# Patient Record
Sex: Female | Born: 1940 | Race: Black or African American | Hispanic: No | Smoking: Former smoker
Health system: Southern US, Community
[De-identification: ages and names within clinical notes are randomized; demographics above are authoritative.]

## PROBLEM LIST (undated history)

## (undated) DIAGNOSIS — E785 Hyperlipidemia, unspecified: Secondary | ICD-10-CM

## (undated) DIAGNOSIS — M5136 Other intervertebral disc degeneration, lumbar region: Secondary | ICD-10-CM

## (undated) DIAGNOSIS — M545 Low back pain, unspecified: Secondary | ICD-10-CM

## (undated) DIAGNOSIS — I10 Essential (primary) hypertension: Secondary | ICD-10-CM

## (undated) DIAGNOSIS — K219 Gastro-esophageal reflux disease without esophagitis: Secondary | ICD-10-CM

## (undated) DIAGNOSIS — D126 Benign neoplasm of colon, unspecified: Secondary | ICD-10-CM

## (undated) DIAGNOSIS — N3281 Overactive bladder: Secondary | ICD-10-CM

## (undated) DIAGNOSIS — E119 Type 2 diabetes mellitus without complications: Secondary | ICD-10-CM

## (undated) DIAGNOSIS — R350 Frequency of micturition: Secondary | ICD-10-CM

## (undated) DIAGNOSIS — G43909 Migraine, unspecified, not intractable, without status migrainosus: Secondary | ICD-10-CM

## (undated) DIAGNOSIS — M1990 Unspecified osteoarthritis, unspecified site: Secondary | ICD-10-CM

## (undated) DIAGNOSIS — N183 Chronic kidney disease, stage 3 unspecified: Secondary | ICD-10-CM

## (undated) DIAGNOSIS — I700 Atherosclerosis of aorta: Secondary | ICD-10-CM

## (undated) DIAGNOSIS — R002 Palpitations: Secondary | ICD-10-CM

## (undated) DIAGNOSIS — M51369 Other intervertebral disc degeneration, lumbar region without mention of lumbar back pain or lower extremity pain: Secondary | ICD-10-CM

## (undated) DIAGNOSIS — R911 Solitary pulmonary nodule: Secondary | ICD-10-CM

## (undated) HISTORY — DX: Type 2 diabetes mellitus without complications: E11.9

## (undated) HISTORY — DX: Low back pain, unspecified: M54.50

## (undated) HISTORY — DX: Migraine, unspecified, not intractable, without status migrainosus: G43.909

## (undated) HISTORY — DX: Low back pain: M54.5

## (undated) HISTORY — DX: Palpitations: R00.2

## (undated) HISTORY — DX: Atherosclerosis of aorta: I70.0

## (undated) HISTORY — DX: Unspecified osteoarthritis, unspecified site: M19.90

## (undated) HISTORY — DX: Hyperlipidemia, unspecified: E78.5

## (undated) HISTORY — DX: Gastro-esophageal reflux disease without esophagitis: K21.9

## (undated) HISTORY — DX: Frequency of micturition: R35.0

## (undated) HISTORY — DX: Other intervertebral disc degeneration, lumbar region: M51.36

## (undated) HISTORY — PX: SHOULDER SURGERY: SHX246

## (undated) HISTORY — DX: Overactive bladder: N32.81

## (undated) HISTORY — DX: Chronic kidney disease, stage 3 unspecified: N18.30

## (undated) HISTORY — DX: Essential (primary) hypertension: I10

## (undated) HISTORY — PX: ABDOMINAL HYSTERECTOMY: SHX81

## (undated) HISTORY — DX: Other intervertebral disc degeneration, lumbar region without mention of lumbar back pain or lower extremity pain: M51.369

## (undated) HISTORY — DX: Benign neoplasm of colon, unspecified: D12.6

## (undated) HISTORY — DX: Solitary pulmonary nodule: R91.1

---

## 1977-01-10 HISTORY — PX: ABDOMINAL HYSTERECTOMY: SHX81

## 1987-11-12 HISTORY — PX: OTHER SURGICAL HISTORY: SHX169

## 1994-11-15 HISTORY — PX: CARDIAC CATHETERIZATION: SHX172

## 1997-06-24 ENCOUNTER — Other Ambulatory Visit: Admission: RE | Admit: 1997-06-24 | Discharge: 1997-06-24 | Payer: Self-pay | Admitting: Family Medicine

## 1997-09-22 ENCOUNTER — Ambulatory Visit (HOSPITAL_COMMUNITY): Admission: RE | Admit: 1997-09-22 | Discharge: 1997-09-22 | Payer: Self-pay | Admitting: Gastroenterology

## 1998-11-12 ENCOUNTER — Encounter: Admission: RE | Admit: 1998-11-12 | Discharge: 1998-11-23 | Payer: Self-pay | Admitting: Family Medicine

## 1999-01-11 HISTORY — PX: CATARACT EXTRACTION, BILATERAL: SHX1313

## 2002-01-10 HISTORY — PX: COLONOSCOPY: SHX174

## 2002-12-04 ENCOUNTER — Ambulatory Visit (HOSPITAL_COMMUNITY): Admission: RE | Admit: 2002-12-04 | Discharge: 2002-12-04 | Payer: Self-pay | Admitting: Gastroenterology

## 2003-11-03 HISTORY — PX: CARDIAC CATHETERIZATION: SHX172

## 2004-08-22 ENCOUNTER — Emergency Department (HOSPITAL_COMMUNITY): Admission: EM | Admit: 2004-08-22 | Discharge: 2004-08-22 | Payer: Self-pay | Admitting: Emergency Medicine

## 2007-01-11 HISTORY — PX: SHOULDER SURGERY: SHX246

## 2008-01-11 HISTORY — PX: COLONOSCOPY: SHX174

## 2009-02-10 LAB — HM DEXA SCAN

## 2009-02-24 ENCOUNTER — Encounter: Admission: RE | Admit: 2009-02-24 | Discharge: 2009-02-24 | Payer: Self-pay | Admitting: Internal Medicine

## 2009-06-20 ENCOUNTER — Ambulatory Visit: Payer: Self-pay | Admitting: Internal Medicine

## 2009-06-20 ENCOUNTER — Emergency Department (HOSPITAL_COMMUNITY): Admission: EM | Admit: 2009-06-20 | Discharge: 2009-06-20 | Payer: Self-pay | Admitting: Family Medicine

## 2009-06-20 ENCOUNTER — Observation Stay (HOSPITAL_COMMUNITY): Admission: EM | Admit: 2009-06-20 | Discharge: 2009-06-21 | Payer: Self-pay | Admitting: Emergency Medicine

## 2009-06-23 ENCOUNTER — Inpatient Hospital Stay (HOSPITAL_COMMUNITY)
Admission: EM | Admit: 2009-06-23 | Discharge: 2009-06-25 | Payer: Self-pay | Source: Home / Self Care | Admitting: Emergency Medicine

## 2010-01-10 DIAGNOSIS — R911 Solitary pulmonary nodule: Secondary | ICD-10-CM

## 2010-01-10 HISTORY — DX: Solitary pulmonary nodule: R91.1

## 2010-01-31 ENCOUNTER — Encounter: Payer: Self-pay | Admitting: Internal Medicine

## 2010-03-28 LAB — COMPREHENSIVE METABOLIC PANEL
ALT: 20 U/L (ref 0–35)
Alkaline Phosphatase: 69 U/L (ref 39–117)
BUN: 14 mg/dL (ref 6–23)
GFR calc Af Amer: >60 mL/min (ref >60)
GFR calc non Af Amer: >60 mL/min (ref >60)
Total Bilirubin: 0.5 mg/dL (ref 0.3–1.2)

## 2010-03-28 LAB — CBC
HCT: 39.5 % (ref 36.0–46.0)
Hemoglobin: 13.4 g/dL (ref 12.0–15.0)
MCHC: 33.8 g/dL (ref 30.0–36.0)
Platelets: 279 10*3/uL (ref 150–400)

## 2010-03-28 LAB — CARDIAC PANEL(CRET KIN+CKTOT+MB+TROPI)
Relative Index: INVALID (ref 0.0–2.5)
Troponin I: 0.02 ng/mL (ref 0.00–0.06)

## 2010-03-28 LAB — C-REACTIVE PROTEIN: CRP: 0.1 mg/dL — ABNORMAL LOW (ref <0.6)

## 2010-03-28 LAB — SEDIMENTATION RATE: Sed Rate: 2 mm/hr (ref 0–22)

## 2010-03-28 LAB — CK TOTAL AND CKMB (NOT AT ARMC): Relative Index: INVALID (ref 0.0–2.5)

## 2010-03-29 LAB — CBC
HCT: 39.5 % (ref 36.0–46.0)
HCT: 43.2 % (ref 36.0–46.0)
Hemoglobin: 13.4 g/dL (ref 12.0–15.0)
Hemoglobin: 14.7 g/dL (ref 12.0–15.0)
MCHC: 33.9 g/dL (ref 30.0–36.0)
Platelets: 278 10*3/uL (ref 150–400)
RBC: 4.74 MIL/uL (ref 3.87–5.11)
RDW: 14.6 % (ref 11.5–15.5)

## 2010-03-29 LAB — BASIC METABOLIC PANEL
CO2: 25 mEq/L (ref 19–32)
Calcium: 9.1 mg/dL (ref 8.4–10.5)
Chloride: 105 mEq/L (ref 96–112)
Creatinine, Ser: 0.73 mg/dL (ref 0.4–1.2)
GFR calc non Af Amer: >60 mL/min (ref >60)
Potassium: 3.4 mEq/L — ABNORMAL LOW (ref 3.5–5.1)

## 2010-03-29 LAB — URINALYSIS, ROUTINE W REFLEX MICROSCOPIC
Glucose, UA: NEGATIVE mg/dL
Glucose, UA: NEGATIVE mg/dL
Hgb urine dipstick: NEGATIVE
Hgb urine dipstick: NEGATIVE
Ketones, ur: NEGATIVE mg/dL
Leukocytes, UA: NEGATIVE
Urobilinogen, UA: 0.2 mg/dL (ref 0.0–1.0)
pH: 8 (ref 5.0–8.0)

## 2010-03-29 LAB — URINE MICROSCOPIC-ADD ON

## 2010-03-29 LAB — POCT I-STAT, CHEM 8
BUN: 13 mg/dL (ref 6–23)
Calcium, Ion: 1.18 mmol/L (ref 1.12–1.32)
Calcium, Ion: 1.18 mmol/L (ref 1.12–1.32)
Chloride: 101 mEq/L (ref 96–112)
Chloride: 106 mEq/L (ref 96–112)
Creatinine, Ser: 0.7 mg/dL (ref 0.4–1.2)
Creatinine, Ser: 0.8 mg/dL (ref 0.4–1.2)
Glucose, Bld: 120 mg/dL — ABNORMAL HIGH (ref 70–99)
Potassium: 3.7 mEq/L (ref 3.5–5.1)
Sodium: 139 mEq/L (ref 135–145)

## 2010-03-29 LAB — DIFFERENTIAL
Basophils Absolute: 0 10*3/uL (ref 0.0–0.1)
Basophils Absolute: 0 10*3/uL (ref 0.0–0.1)
Basophils Relative: 0 % (ref 0–1)
Eosinophils Relative: 0 % (ref 0–5)
Lymphocytes Relative: 41 % (ref 12–46)
Lymphs Abs: 1.9 10*3/uL (ref 0.7–4.0)
Monocytes Absolute: 0.2 10*3/uL (ref 0.1–1.0)
Monocytes Absolute: 0.3 10*3/uL (ref 0.1–1.0)
Monocytes Relative: 4 % (ref 3–12)
Monocytes Relative: 7 % (ref 3–12)
Neutro Abs: 4.5 10*3/uL (ref 1.7–7.7)
Neutrophils Relative %: 51 % (ref 43–77)

## 2010-03-29 LAB — COMPREHENSIVE METABOLIC PANEL
BUN: 10 mg/dL (ref 6–23)
CO2: 26 mEq/L (ref 19–32)
Calcium: 9 mg/dL (ref 8.4–10.5)
Chloride: 103 mEq/L (ref 96–112)
Creatinine, Ser: 0.87 mg/dL (ref 0.4–1.2)
Glucose, Bld: 86 mg/dL (ref 70–99)

## 2010-03-29 LAB — APTT: aPTT: 27 seconds (ref 24–37)

## 2010-03-29 LAB — LIPID PANEL
Cholesterol: 137 mg/dL (ref 0–200)
LDL Cholesterol: 63 mg/dL (ref 0–99)
Triglycerides: 133 mg/dL (ref <150)
VLDL: 27 mg/dL (ref 0–40)

## 2010-03-29 LAB — URINE CULTURE

## 2010-03-29 LAB — HEMOGLOBIN A1C
Hgb A1c MFr Bld: 6.1 % — ABNORMAL HIGH (ref <5.7)
Mean Plasma Glucose: 128 mg/dL — ABNORMAL HIGH (ref <117)

## 2010-03-29 LAB — POCT CARDIAC MARKERS
CKMB, poc: 1.7 ng/mL (ref 1.0–8.0)
CKMB, poc: 2.2 ng/mL (ref 1.0–8.0)
Myoglobin, poc: 63.4 ng/mL (ref 12–200)
Myoglobin, poc: 96.1 ng/mL (ref 12–200)
Troponin i, poc: <0.05 ng/mL (ref 0.00–0.09)

## 2010-05-19 HISTORY — PX: TRANSTHORACIC ECHOCARDIOGRAM: SHX275

## 2010-05-28 NOTE — Op Note (Signed)
NAME:  Mandy Perry, SERGENT                          ACCOUNT NO.:  [id number]   MEDICAL RECORD NO.:  [id number]                   PATIENT TYPE:  AMB   LOCATION:  ENDO                                 FACILITY:  [facility]   PHYSICIAN:  Bernette Redbird, M.D.                DATE OF BIRTH:  December 10, 1940   DATE OF PROCEDURE:  12/04/2002  DATE OF DISCHARGE:                                 OPERATIVE REPORT   PROCEDURE:  Colonoscopy.   INDICATIONS:  Followup of solitary adenomatous polyp removed  colonoscopically five years ago.   FINDINGS:  Normal exam except perhaps minimal diverticulosis.   DESCRIPTION OF PROCEDURE:  The nature, purpose and risks of this procedure  were familiar to the patient from prior examination and she provided written  consent.  Sedation was fentanyl 75 mcg and Versed 6 mg IV without  arrhythmias or desaturation.  The Olympus adjustable tension pediatric video  coloscope was easily advanced to the terminal ileum which had a normal  appearance and pull-back was then performed, having entered the terminal  ileum for a short distance.  The quality of the prep was very good and it  was felt that all areas were well seen.   This was a normal examination.  No polyps, cancer, colitis, vascular  malformations or significant diverticulosis were noted.  There may have been  a few scattered left-sided diverticula.  Retroflexion of the rectum and  reinspection of the rectosigmoid was unremarkable.  No biopsies were  obtained. The patient tolerated the procedure well and there were no  apparent complications.   IMPRESSION:  Normal surveillance colonoscopy in a patient with a prior  history of colonic adenoma having been removed.   PLAN:  Followup colonoscopy in five years.                                               Bernette Redbird, M.D.    RB/MEDQ  D:  12/04/2002  T:  12/04/2002  Job:  [id number]   cc:   Talmadge Coventry, M.D.  [address]  [place]  [place]  [address]  Fax: [phone]

## 2011-04-14 ENCOUNTER — Other Ambulatory Visit: Payer: Self-pay | Admitting: Internal Medicine

## 2011-04-14 DIAGNOSIS — R1011 Right upper quadrant pain: Secondary | ICD-10-CM

## 2011-04-15 ENCOUNTER — Ambulatory Visit
Admission: RE | Admit: 2011-04-15 | Discharge: 2011-04-15 | Disposition: A | Discharge status: Home / Self Care | Payer: Medicare Other | Source: Ambulatory Visit | Attending: Internal Medicine | Admitting: Internal Medicine

## 2011-04-15 DIAGNOSIS — R1011 Right upper quadrant pain: Secondary | ICD-10-CM

## 2012-04-03 ENCOUNTER — Other Ambulatory Visit: Payer: 59

## 2012-04-03 ENCOUNTER — Other Ambulatory Visit: Payer: Self-pay | Admitting: *Deleted

## 2012-04-03 DIAGNOSIS — E781 Pure hyperglyceridemia: Secondary | ICD-10-CM

## 2012-04-03 DIAGNOSIS — E785 Hyperlipidemia, unspecified: Secondary | ICD-10-CM

## 2012-04-03 DIAGNOSIS — E119 Type 2 diabetes mellitus without complications: Secondary | ICD-10-CM

## 2012-04-04 ENCOUNTER — Other Ambulatory Visit: Payer: Self-pay | Admitting: Geriatric Medicine

## 2012-04-04 LAB — HEPATIC FUNCTION PANEL
ALT: 12 IU/L (ref 0–32)
AST: 14 IU/L (ref 0–40)
Albumin: 4.7 g/dL (ref 3.5–4.8)
Alkaline Phosphatase: 53 IU/L (ref 39–117)
Bilirubin, Direct: 0.09 mg/dL (ref 0.00–0.40)
Total Bilirubin: 0.3 mg/dL (ref 0.0–1.2)
Total Protein: 7.5 g/dL (ref 6.0–8.5)

## 2012-04-04 LAB — LIPID PANEL
Chol/HDL Ratio: 3.9 ratio units (ref 0.0–4.4)
Cholesterol, Total: 156 mg/dL (ref 100–199)
HDL: 40 mg/dL (ref >39)
LDL Calculated: 81 mg/dL (ref 0–99)
Triglycerides: 174 mg/dL — ABNORMAL HIGH (ref 0–149)
VLDL Cholesterol Cal: 35 mg/dL (ref 5–40)

## 2012-04-04 MED ORDER — LOSARTAN POTASSIUM 50 MG PO TABS: 50.0000 mg | ORAL_TABLET | Freq: Every day | ORAL | Status: DC

## 2012-05-14 ENCOUNTER — Ambulatory Visit: Payer: Self-pay | Admitting: Internal Medicine

## 2012-05-15 ENCOUNTER — Other Ambulatory Visit: Payer: Self-pay | Admitting: Nurse Practitioner

## 2012-05-18 ENCOUNTER — Encounter: Payer: Self-pay | Admitting: *Deleted

## 2012-05-21 ENCOUNTER — Ambulatory Visit (INDEPENDENT_AMBULATORY_CARE_PROVIDER_SITE_OTHER): Payer: 59 | Admitting: Internal Medicine

## 2012-05-21 ENCOUNTER — Encounter: Payer: Self-pay | Admitting: Internal Medicine

## 2012-05-21 VITALS — BP 142/78 | HR 58 | Temp 98.4°F | Resp 16 | Ht 65.0 in | Wt 141.0 lb

## 2012-05-21 DIAGNOSIS — I10 Essential (primary) hypertension: Secondary | ICD-10-CM

## 2012-05-21 DIAGNOSIS — E782 Mixed hyperlipidemia: Secondary | ICD-10-CM | POA: Insufficient documentation

## 2012-05-21 DIAGNOSIS — K219 Gastro-esophageal reflux disease without esophagitis: Secondary | ICD-10-CM

## 2012-05-21 DIAGNOSIS — R350 Frequency of micturition: Secondary | ICD-10-CM

## 2012-05-21 DIAGNOSIS — E785 Hyperlipidemia, unspecified: Secondary | ICD-10-CM

## 2012-05-21 DIAGNOSIS — E119 Type 2 diabetes mellitus without complications: Secondary | ICD-10-CM | POA: Insufficient documentation

## 2012-05-21 MED ORDER — METOPROLOL TARTRATE 100 MG PO TABS: 100.0000 mg | ORAL_TABLET | Freq: Every day | ORAL | Status: DC

## 2012-05-21 MED ORDER — HYDROCHLOROTHIAZIDE 25 MG PO TABS: 25.0000 mg | ORAL_TABLET | Freq: Every day | ORAL | Status: DC

## 2012-05-21 MED ORDER — METFORMIN HCL 1000 MG PO TABS: 1000.0000 mg | ORAL_TABLET | Freq: Two times a day (BID) | ORAL | Status: DC

## 2012-05-21 NOTE — Progress Notes (Signed)
Patient ID: Mandy Perry, female   DOB: 1940-07-21, 72 y.o.   MRN: [id number] Code Status: Does not have living will--discussed today.    Allergies  Allergen Reactions  . Azithromycin   . Tramadol     Chief Complaint  Patient presents with  . Medical Managment of Chronic Issues    urinary urgency    HPI: Patient is a 72 y.o. AA female seen in the office today for medical mgt of chronic diseases.  She has had increased urinary urgency recently.  Comes time to time.  Cannot always go when she tries.  Can go later.  Feels sensation, but nothing there at first.  Sometimes does go frequently.  Also goes twice at night, but this is routine.    Says she has difficulty thinking of what she is going to say--sometimes has to be a minute before it comes to her.  Keeps calendar to remember appts.    Thinks she has some headaches from stress and allergies.  Allegra has helped her.  Took 4 days to kick in.    Review of Systems:  Review of Systems  Constitutional: Negative for fever and chills.  HENT: Positive for congestion and sore throat.   Eyes: Negative for blurred vision.  Respiratory: Positive for cough. Negative for shortness of breath.   Cardiovascular: Negative for chest pain.  Gastrointestinal: Positive for abdominal pain. Negative for blood in stool and melena.  Genitourinary: Positive for dysuria, urgency and frequency.  Musculoskeletal: Negative for falls.  Skin: Negative for rash.  Neurological: Negative for dizziness.  Psychiatric/Behavioral: Negative for depression and memory loss.     Past Medical History  Diagnosis Date  . Diabetes mellitus without complication   . Hyperlipidemia   . Hypertension   . Solitary pulmonary nodule 2012  . Migraine, unspecified, without mention of intractable migraine without mention of status migrainosus   . Lumbago   . Benign neoplasm of colon   . GERD (gastroesophageal reflux disease)   . Other premature beats   . Osteoarthrosis,  unspecified whether generalized or localized, unspecified site   . Palpitations   . Urinary frequency    Past Surgical History  Procedure Laterality Date  . Abdominal hysterectomy     Social History:   reports that she has quit smoking. Her smoking use included Cigarettes. She smoked 0.00 packs per day. She does not have any smokeless tobacco history on file. She reports that she does not drink alcohol or use illicit drugs.  Family History  Problem Relation Age of Onset  . CVA Mother   . Cancer Father     prostate  . Cancer Brother     Medications: Patient's Medications  New Prescriptions   No medications on file  Previous Medications   AMLODIPINE (NORVASC) 10 MG TABLET    TAKE ONE-HALF TABLET BY MOUTH EVERY DAY FOR BLOOD PRESSURE   ASPIRIN 81 MG TABLET    Take 81 mg by mouth daily.   GEMFIBROZIL (LOPID) 600 MG TABLET    Take one tablet by mouth at bedtime for triglycerides   HYDROCHLOROTHIAZIDE (HYDRODIURIL) 25 MG TABLET    Take one tablet by mouth once daily to control blood pressure   LOSARTAN (COZAAR) 50 MG TABLET    Take 1 tablet (50 mg total) by mouth daily.   METFORMIN (GLUCOPHAGE) 1000 MG TABLET    Take one tablet once twice daily for diabetes   METOPROLOL (LOPRESSOR) 100 MG TABLET    Take one tablet by  mouth once daily for blood pressure   MULTIPLE VITAMINS-MINERALS (CENTRUM SILVER PO)    Take one tablet once daily   OMEGA-3 FATTY ACIDS (OMEGA 3 PO)    Take one tablet by mouth twice daily for cholesterol   PRAVASTATIN (PRAVACHOL) 40 MG TABLET    Take 1/2 tablet by mouth once daily to control cholesterol  Modified Medications   No medications on file  Discontinued Medications   No medications on file   Physical Exam: Filed Vitals:   05/21/12 0832  BP: 142/78  Pulse: 58  Temp: 98.4 F (36.9 C)  TempSrc: Oral  Resp: 16  Height: 5\' 5"  (1.651 m)  Weight: 141 lb (63.957 kg)  SpO2: 95%   Physical Exam  Constitutional: She is oriented to person, place, and time.  She appears well-developed and well-nourished. No distress.  HENT:  Mouth/Throat: Oropharyngeal exudate present.  Cardiovascular: Normal rate, regular rhythm, normal heart sounds and intact distal pulses.   Pulmonary/Chest: Breath sounds normal. No respiratory distress.  Abdominal: Soft. Bowel sounds are normal. She exhibits no distension. There is no tenderness.  Musculoskeletal: Normal range of motion. She exhibits no edema and no tenderness.  Neurological: She is alert and oriented to person, place, and time.  Skin: Skin is warm and dry.    Labs reviewed: Liver Function Tests:  Recent Labs  04/03/12 0922  AST 14  ALT 12  ALKPHOS 53  BILITOT 0.3  PROT 7.5  Lipid Panel:  Recent Labs  04/03/12 0922  HDL 40  LDLCALC 81  TRIG 174*  CHOLHDL 3.9   Assessment/Plan 1. Diabetes mellitus without complication--may be causing polyuria - CMP; Future - Hemoglobin A1c; Future - metFORMIN (GLUCOPHAGE) 1000 MG tablet; Take 1 tablet (1,000 mg total) by mouth 2 (two) times daily with a meal. Take one tablet once twice daily for diabetes  Dispense: 60 tablet; Refill: 5  2. Hyperlipidemia Lipids above goal with 20mg  pravachol, increase to 40mg   3. GERD (gastroesophageal reflux disease) -stable, no changes needed  4. Urinary frequency -may be due to uncontrolled DM--f/u hba1c - CBC with Differential; Future - if persists and dm reasonably controlled, will need ua c+s  5. Essential hypertension, benign Refilled meds: - metoprolol (LOPRESSOR) 100 MG tablet; Take 1 tablet (100 mg total) by mouth daily. Take one tablet by mouth once daily for blood pressure  Dispense: 30 tablet; Refill: 5 - hydrochlorothiazide (HYDRODIURIL) 25 MG tablet; Take 1 tablet (25 mg total) by mouth daily. Take one tablet by mouth once daily to control blood pressure  Dispense: 30 tablet; Refill: 5  Labs/tests ordered  Cbc, hba1c, cmp

## 2012-07-17 ENCOUNTER — Other Ambulatory Visit: Payer: Self-pay | Admitting: Nurse Practitioner

## 2012-07-17 ENCOUNTER — Other Ambulatory Visit: Payer: Self-pay | Admitting: Internal Medicine

## 2012-07-20 LAB — HM MAMMOGRAPHY: HM Mammogram: NORMAL

## 2012-07-25 ENCOUNTER — Other Ambulatory Visit: Payer: Self-pay | Admitting: Internal Medicine

## 2012-08-08 ENCOUNTER — Encounter: Payer: Self-pay | Admitting: Internal Medicine

## 2012-08-29 ENCOUNTER — Other Ambulatory Visit: Payer: Self-pay | Admitting: Internal Medicine

## 2012-09-12 ENCOUNTER — Other Ambulatory Visit: Payer: Self-pay | Admitting: Nurse Practitioner

## 2012-10-01 ENCOUNTER — Other Ambulatory Visit: Payer: Self-pay | Admitting: Internal Medicine

## 2012-10-11 ENCOUNTER — Ambulatory Visit (INDEPENDENT_AMBULATORY_CARE_PROVIDER_SITE_OTHER): Payer: Medicare PPO | Admitting: Cardiology

## 2012-10-11 ENCOUNTER — Encounter: Payer: Self-pay | Admitting: Cardiology

## 2012-10-11 ENCOUNTER — Telehealth: Payer: Self-pay | Admitting: Cardiology

## 2012-10-11 VITALS — BP 140/80 | HR 48 | Ht 65.0 in | Wt 139.9 lb

## 2012-10-11 DIAGNOSIS — E785 Hyperlipidemia, unspecified: Secondary | ICD-10-CM

## 2012-10-11 DIAGNOSIS — Z8679 Personal history of other diseases of the circulatory system: Secondary | ICD-10-CM

## 2012-10-11 DIAGNOSIS — I10 Essential (primary) hypertension: Secondary | ICD-10-CM

## 2012-10-11 NOTE — Assessment & Plan Note (Signed)
Relatively well-controlled.  Stable.

## 2012-10-11 NOTE — Assessment & Plan Note (Signed)
Well-controlled PVCs and PSVT.  Lesion to clarify whether she is taking tartrate or succinate version of metoprolol.  Next the dosing is correct.  She taken 100 mg tartrate once daily, that is incorrect dosing, it should be 50 mg twice a day of metoprolol tartrate.  100 mg daily of metoprolol succinate would be the correct dose but not tartrate.  We will make sure prescriptions none currently.

## 2012-10-11 NOTE — Patient Instructions (Signed)
You seem to be doing well.   Glad to hear the the irregular heartbeats are under control.  I want you to check on your medication bottle for Metoprolol. Look to see if it is Metoprolol Tartrate or Succinate.   If the bottle says Succinate, and then the dosing as directed 100 mg daily;  If the bottle says Tartrate, then I would like for you to cut the pill in half and take it is one half tablet twice a day (that is the equivalent of 50 mg twice a day)  Otherwise things seem to be doing very well, glad to see that you are doing well, and continuing to go for walks.  We will see you back in a year.  Marykay Lex, MD  Your physician wants you to follow-up in: 1 year. You will receive a reminder letter in the mail two months in advance. If you don't receive a letter, please call our office to schedule the follow-up appointment.

## 2012-10-11 NOTE — Telephone Encounter (Signed)
Pt was in the office today and was instructed by Burt Knack to call the office in regards to meds.

## 2012-10-11 NOTE — Telephone Encounter (Signed)
Patient states that her bottle at home is Metoprolol Tartrate  . PER  Instruction from this morning visit. She will take 1/2 tablet twice a day for now on.

## 2012-10-11 NOTE — Progress Notes (Signed)
PATIENT: Mandy Perry MRN: [id number]  DOB: April 19, 1940   DOV:10/12/2012 PCP: Bufford Spikes, DO  Clinic Note: Chief Complaint  Patient presents with  . 18 month visit    chestpain when Mandy Perry walks and get tired, sob with walking,edema  labs in 03/2012    HPI: Mandy Perry is a 72 y.o. female with a PMH below who presents today for what amounts to be a 1 year half follow for her history of SVT.  Mandy Perry's been evaluated in the past for chest discomfort is an echocardiogram done on the sclerotic aortic valve without stenosis.  Mandy Perry had treadmill stress test in November of 89 negative.  On heart catheterization 2005 that was essentially normal.  Interval History: Mandy Perry presented today, has not been seen since April 2013.  It seems like her prescription was refilled for metoprolol via telephone, but I'm not sure if Mandy Perry got it refilled correctly.  It is currently listed as metoprolol tartrate 100 mg daily as opposed to  metoprololsuccinate 100 mg daily. Her major concern today is that Mandy Perry has is in her chest when Mandy Perry walks early walks a mile a day.  Is really described as chest pain or any per say just a tiredness.  He denies any dyspnea associated with it.  On that side Mandy Perry denies any frequent palpitations.  Mandy Perry says he feels every now and then a flip-flop but nothing that last one a couple seconds.  Nothing that makes her concerned.  The remainder of Cardiovascular ROS: positive for - Decreased exercise with fatigue negative for - edema, loss of consciousness, murmur, orthopnea, paroxysmal nocturnal dyspnea, rapid heart rate or shortness of breath: Additional cardiac review of systems: Lightheadedness - no, dizziness - no, syncope/near-syncope - no; TIA/amaurosis fugax - no Melena - no, hematochezia no; hematuria - no; nosebleeds - no; claudication - no  Past Medical History  Diagnosis Date  . Diabetes mellitus without complication     A1c currently a goal  . Hyperlipidemia     Followed by PCP  .  Essential hypertension, benign   . Solitary pulmonary nodule 2012  . Migraine, unspecified, without mention of intractable migraine without mention of status migrainosus   . Lumbago   . Benign neoplasm of colon   . GERD (gastroesophageal reflux disease)   . Osteoarthrosis, unspecified whether generalized or localized, unspecified site   . Palpitations     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  . Urinary frequency   . Hypertension     Prior Cardiac Evaluation and Past Surgical History: Past Surgical History  Procedure Laterality Date  . Abdominal hysterectomy    . Cardiac catheterization  11/15/1994    Patent coronary arteries and normal LV function  . Cardiac catheterization  11/03/2003    Normal coronary arteries  . Exercise stress test  11/12/1987    Positive  . Transthoracic echocardiogram  05/19/2010    EF >55%, normal-mild    Allergies  Allergen Reactions  . Azithromycin Hives  . Statins     Myalgias-high doses  . Tramadol Nausea Only    Current Outpatient Prescriptions  Medication Sig Dispense Refill  . acetaminophen (TYLENOL) 500 MG tablet Take 500 mg by mouth as needed for pain.      Marland Kitchen amLODipine (NORVASC) 10 MG tablet TAKE ONE-HALF TABLET BY MOUTH ONCE DAILY FOR BLOOD PRESSURE  30 tablet  0  . aspirin 325 MG tablet Take 1/2 tablet by mouth daily      . hydrochlorothiazide (HYDRODIURIL) 25 MG  tablet Take 1 tablet (25 mg total) by mouth daily. Take one tablet by mouth once daily to control blood pressure  30 tablet  5  . Lansoprazole (PREVACID PO) Take by mouth as needed.      Marland Kitchen losartan (COZAAR) 50 MG tablet TAKE ONE TABLET BY MOUTH ONCE DAILY  30 tablet  2  . metFORMIN (GLUCOPHAGE) 1000 MG tablet Take 1 tablet (1,000 mg total) by mouth 2 (two) times daily with a meal. Take one tablet once twice daily for diabetes  60 tablet  5  . metoprolol (LOPRESSOR) 100 MG tablet Take 1/2 tablet of 100 mg by mouth twice a day      . Multiple Vitamins-Minerals (CENTRUM SILVER PO) Take one  tablet once daily      . Omega-3 Fatty Acids (OMEGA 3 PO) Take one tablet by mouth twice daily for cholesterol      . pravastatin (PRAVACHOL) 40 MG tablet Take 1/2 tablet by mouth once daily to control cholesterol       No current facility-administered medications for this visit.    History   Social History Narrative   Mandy Perry is a married, mother of 3, grandmother 15. Mandy Perry had 10 years a history of about a half pack a day but quit 10 years ago. Her exercising is limited by her osteoarthritis pains. But Mandy Perry tries to do some of the exercises with walking on a daily basis.    ROS: A comprehensive Review of Systems - Negative except Occasional migraine headaches and chronic low back pain.  PHYSICAL EXAM BP 140/80  Pulse 48  Ht 5\' 5"  (1.651 m)  Wt 139 lb 14.4 oz (63.458 kg)  BMI 23.28 kg/m2 General appearance: alert, cooperative, appears stated age, no distress and Healthy-appearing, well-nourished and well-groomed. Neck: no adenopathy, no carotid bruit, no JVD and supple, symmetrical, trachea midline Lungs: clear to auscultation bilaterally, normal percussion bilaterally and Nonlabored, good air movement Heart: regular rate and rhythm, S1, S2 normal, no murmur, click, rub or gallop and normal apical impulse Abdomen: soft, non-tender; bowel sounds normal; no masses,  no organomegaly Extremities: extremities normal, atraumatic, no cyanosis or edema, no edema, redness or tenderness in the calves or thighs and no ulcers, gangrene or trophic changes Pulses: 2+ and symmetric Neurologic: Grossly normal HEENT: [place]/AT, EOMI, MMM, anicteric sclera  [id number] today: Yes Rate: 48 , Rhythm: Sinus bradycardia with some mild nonspecific ST changes due to artifact.  Otherwise normal.;   Recent Labs: None available  ASSESSMENT / PLAN: History of PSVT (paroxysmal supraventricular tachycardia) Well-controlled PVCs and PSVT.  Lesion to clarify whether Mandy Perry is taking tartrate or succinate version of  metoprolol.  Next the dosing is correct.  Mandy Perry taken 100 mg tartrate once daily, that is incorrect dosing, it should be 50 mg twice a day of metoprolol tartrate.  100 mg daily of metoprolol succinate would be the correct dose but not tartrate.  We will make sure prescriptions none currently.  Hyperlipidemia Monitored by her PCP  Hypertension Relatively well-controlled.  Stable.   Orders Placed This Encounter  Procedures  . EKG 12-Lead    Scheduling Instructions:     [facility] [employer] Specific Question:  Where should this test be performed    Answer:  OTHER   Meds ordered this encounter  Medications  . aspirin 325 MG tablet    Sig: Take 1/2 tablet by mouth daily  . acetaminophen (TYLENOL) 500 MG tablet    Sig: Take  500 mg by mouth as needed for pain.  . Lansoprazole (PREVACID PO)    Sig: Take by mouth as needed.  . metoprolol (LOPRESSOR) 100 MG tablet    Sig: Take 1/2 tablet of 100 mg by mouth twice a day    Followup: 1 yr  Myrical Andujo W. Herbie Baltimore, M.D., M.S. [facility] [address] [place], [place]  [address]  [phone] Pager # [phone]

## 2012-10-11 NOTE — Assessment & Plan Note (Signed)
Monitored by her PCP 

## 2012-10-12 ENCOUNTER — Encounter: Payer: Self-pay | Admitting: Cardiology

## 2012-11-20 ENCOUNTER — Other Ambulatory Visit: Payer: Self-pay | Admitting: Internal Medicine

## 2012-11-20 ENCOUNTER — Ambulatory Visit (INDEPENDENT_AMBULATORY_CARE_PROVIDER_SITE_OTHER): Payer: 59

## 2012-11-20 ENCOUNTER — Other Ambulatory Visit: Payer: 59

## 2012-11-20 ENCOUNTER — Other Ambulatory Visit: Payer: Self-pay | Admitting: Nurse Practitioner

## 2012-11-20 DIAGNOSIS — Z23 Encounter for immunization: Secondary | ICD-10-CM

## 2012-11-22 ENCOUNTER — Ambulatory Visit: Payer: 59 | Admitting: Internal Medicine

## 2012-11-24 ENCOUNTER — Other Ambulatory Visit: Payer: Self-pay | Admitting: Internal Medicine

## 2012-12-24 ENCOUNTER — Other Ambulatory Visit: Payer: Self-pay | Admitting: Internal Medicine

## 2012-12-27 ENCOUNTER — Other Ambulatory Visit: Payer: 59

## 2012-12-27 DIAGNOSIS — I10 Essential (primary) hypertension: Secondary | ICD-10-CM

## 2012-12-27 DIAGNOSIS — E119 Type 2 diabetes mellitus without complications: Secondary | ICD-10-CM

## 2012-12-27 DIAGNOSIS — E785 Hyperlipidemia, unspecified: Secondary | ICD-10-CM

## 2012-12-28 LAB — CBC WITH DIFFERENTIAL/PLATELET
Basophils Absolute: 0 10*3/uL (ref 0.0–0.2)
Basos: 1 %
Eos: 3 %
Eosinophils Absolute: 0.1 10*3/uL (ref 0.0–0.4)
HCT: 38.1 % (ref 34.0–46.6)
Hemoglobin: 12.8 g/dL (ref 11.1–15.9)
Immature Grans (Abs): 0 10*3/uL (ref 0.0–0.1)
Immature Granulocytes: 0 %
Lymphocytes Absolute: 1.6 10*3/uL (ref 0.7–3.1)
Lymphs: 41 %
MCH: 29.9 pg (ref 26.6–33.0)
MCHC: 33.6 g/dL (ref 31.5–35.7)
MCV: 89 fL (ref 79–97)
Monocytes Absolute: 0.3 10*3/uL (ref 0.1–0.9)
Monocytes: 8 %
Neutrophils Absolute: 1.8 10*3/uL (ref 1.4–7.0)
Neutrophils Relative %: 47 %
RBC: 4.28 x10E6/uL (ref 3.77–5.28)
RDW: 14.3 % (ref 12.3–15.4)
WBC: 3.9 10*3/uL (ref 3.4–10.8)

## 2012-12-28 LAB — COMPREHENSIVE METABOLIC PANEL
ALT: 13 IU/L (ref 0–32)
AST: 12 IU/L (ref 0–40)
Albumin/Globulin Ratio: 1.9 (ref 1.1–2.5)
Albumin: 4.7 g/dL (ref 3.5–4.8)
Alkaline Phosphatase: 65 IU/L (ref 39–117)
BUN/Creatinine Ratio: 16 (ref 11–26)
BUN: 18 mg/dL (ref 8–27)
CO2: 23 mmol/L (ref 18–29)
Calcium: 10.1 mg/dL (ref 8.6–10.2)
Chloride: 99 mmol/L (ref 97–108)
Creatinine, Ser: 1.13 mg/dL — ABNORMAL HIGH (ref 0.57–1.00)
GFR calc Af Amer: 57 mL/min/{1.73_m2} — ABNORMAL LOW (ref >59)
GFR calc non Af Amer: 49 mL/min/{1.73_m2} — ABNORMAL LOW (ref >59)
Globulin, Total: 2.5 g/dL (ref 1.5–4.5)
Glucose: 115 mg/dL — ABNORMAL HIGH (ref 65–99)
Potassium: 4.2 mmol/L (ref 3.5–5.2)
Sodium: 142 mmol/L (ref 134–144)
Total Bilirubin: 0.3 mg/dL (ref 0.0–1.2)
Total Protein: 7.2 g/dL (ref 6.0–8.5)

## 2012-12-28 LAB — LIPID PANEL
Chol/HDL Ratio: 3.8 ratio units (ref 0.0–4.4)
Cholesterol, Total: 154 mg/dL (ref 100–199)
HDL: 41 mg/dL (ref >39)
LDL Calculated: 73 mg/dL (ref 0–99)
Triglycerides: 199 mg/dL — ABNORMAL HIGH (ref 0–149)
VLDL Cholesterol Cal: 40 mg/dL (ref 5–40)

## 2012-12-28 LAB — HEMOGLOBIN A1C
Est. average glucose Bld gHb Est-mCnc: 137 mg/dL
Hgb A1c MFr Bld: 6.4 % — ABNORMAL HIGH (ref 4.8–5.6)

## 2012-12-31 ENCOUNTER — Ambulatory Visit (INDEPENDENT_AMBULATORY_CARE_PROVIDER_SITE_OTHER): Payer: 59 | Admitting: Internal Medicine

## 2012-12-31 ENCOUNTER — Encounter (INDEPENDENT_AMBULATORY_CARE_PROVIDER_SITE_OTHER): Payer: Self-pay

## 2012-12-31 ENCOUNTER — Encounter: Payer: Self-pay | Admitting: Internal Medicine

## 2012-12-31 VITALS — BP 126/70 | HR 76 | Temp 98.5°F | Wt 145.0 lb

## 2012-12-31 DIAGNOSIS — N183 Chronic kidney disease, stage 3 unspecified: Secondary | ICD-10-CM

## 2012-12-31 DIAGNOSIS — R1011 Right upper quadrant pain: Secondary | ICD-10-CM

## 2012-12-31 DIAGNOSIS — E1149 Type 2 diabetes mellitus with other diabetic neurological complication: Secondary | ICD-10-CM

## 2012-12-31 DIAGNOSIS — I10 Essential (primary) hypertension: Secondary | ICD-10-CM

## 2012-12-31 DIAGNOSIS — G909 Disorder of the autonomic nervous system, unspecified: Secondary | ICD-10-CM

## 2012-12-31 DIAGNOSIS — L739 Follicular disorder, unspecified: Secondary | ICD-10-CM

## 2012-12-31 DIAGNOSIS — E1143 Type 2 diabetes mellitus with diabetic autonomic (poly)neuropathy: Secondary | ICD-10-CM

## 2012-12-31 DIAGNOSIS — Z23 Encounter for immunization: Secondary | ICD-10-CM

## 2012-12-31 DIAGNOSIS — K589 Irritable bowel syndrome without diarrhea: Secondary | ICD-10-CM

## 2012-12-31 DIAGNOSIS — E1129 Type 2 diabetes mellitus with other diabetic kidney complication: Secondary | ICD-10-CM

## 2012-12-31 DIAGNOSIS — L678 Other hair color and hair shaft abnormalities: Secondary | ICD-10-CM

## 2012-12-31 DIAGNOSIS — E781 Pure hyperglyceridemia: Secondary | ICD-10-CM

## 2012-12-31 DIAGNOSIS — N058 Unspecified nephritic syndrome with other morphologic changes: Secondary | ICD-10-CM

## 2012-12-31 DIAGNOSIS — L738 Other specified follicular disorders: Secondary | ICD-10-CM

## 2012-12-31 MED ORDER — HYDROCHLOROTHIAZIDE 25 MG PO TABS: 25.0000 mg | ORAL_TABLET | Freq: Every day | ORAL | Status: DC

## 2012-12-31 MED ORDER — FENOFIBRATE 145 MG PO TABS: 145.0000 mg | ORAL_TABLET | Freq: Every day | ORAL | Status: DC

## 2012-12-31 MED ORDER — LOSARTAN POTASSIUM 50 MG PO TABS: 50.0000 mg | ORAL_TABLET | Freq: Every day | ORAL | Status: DC

## 2012-12-31 MED ORDER — AMLODIPINE BESYLATE 10 MG PO TABS: 10.0000 mg | ORAL_TABLET | Freq: Every day | ORAL | Status: DC

## 2012-12-31 MED ORDER — METOPROLOL TARTRATE 100 MG PO TABS: 100.0000 mg | ORAL_TABLET | ORAL | Status: DC

## 2012-12-31 MED ORDER — METFORMIN HCL 1000 MG PO TABS: 1000.0000 mg | ORAL_TABLET | Freq: Two times a day (BID) | ORAL | Status: DC

## 2012-12-31 NOTE — Progress Notes (Signed)
Patient ID: Mandy Perry, female   DOB: [birth year], 72 y.o.   MRN: [id number]   Location:  [facility]  Allergies  Allergen Reactions  . Azithromycin Hives  . Statins     Myalgias-high doses  . Tramadol Nausea Only    Chief Complaint  Patient presents with  . Medical Managment of Chronic Issues    6 month f/u with labs printed  . Immunizations    needs Pneumo,  Td 8 yrs ago  . other    pain on RT side occasionally, constipation-loss stools, bumps on behind that itch, numbness in the third finger on RT hand, bleeding in her gums, ? still need to take Pravachol due to having cramps in legs    HPI: Patient is a 72 y.o. black female seen in the office today for medical management of chronic diseases.  She needs her pneumonia vaccine--can get today.    Has pain in her right side occasionally.  Hurts so bad she can hardly stand up.  Happened once in early Dec.  In right upper quadrant.  Says she eats small amts all of the time.  Is still gaining weight.  Notes combination of constipation and diarrhea.  Drinks glucerna and then it's really loose after that.  She notes bumps on her behind that itch.      She has numbness of her third finger on the right hand.  Has had bleeding of her gums.  Happens only sometimes when flosses.  Dentist wasn't concerned.    Has had cramps in her legs and asks if she can stop pravachol.    Review of Systems:  Review of Systems  Constitutional: Negative for fever and malaise/fatigue.  HENT: Negative for congestion.   Eyes: Negative for blurred vision.  Respiratory: Negative for shortness of breath.   Cardiovascular: Negative for chest pain and leg swelling.  Gastrointestinal: Positive for abdominal pain, diarrhea and constipation. Negative for heartburn, blood in stool and melena.  Genitourinary: Negative for dysuria, urgency and frequency.  Musculoskeletal: Negative for falls.  Skin: Positive for itching and  rash.  Neurological: Positive for sensory change. Negative for loss of consciousness, weakness and headaches.  Endo/Heme/Allergies: Does not bruise/bleed easily.  Psychiatric/Behavioral: Negative for memory loss. The patient is nervous/anxious.     Past Medical History  Diagnosis Date  . Diabetes mellitus without complication     A1c currently a goal  . Hyperlipidemia     Followed by PCP  . Essential hypertension, benign   . Solitary pulmonary nodule 2012  . Migraine, unspecified, without mention of intractable migraine without mention of status migrainosus   . Lumbago   . Benign neoplasm of colon   . GERD (gastroesophageal reflux disease)   . Osteoarthrosis, unspecified whether generalized or localized, unspecified site   . Palpitations     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  . Urinary frequency   . Hypertension     Past Surgical History  Procedure Laterality Date  . Abdominal hysterectomy    . Cardiac catheterization  11/15/1994    Patent coronary arteries and normal LV function  . Cardiac catheterization  11/03/2003    Normal coronary arteries  . Exercise stress test  11/12/1987    Positive  . Transthoracic echocardiogram  05/19/2010    EF >55%, normal-mild  . Colonoscopy  2010    Dr.Bucinni, Normal   . Colonoscopy  2004    Dr.Bucinni    Social History:   reports  that she has quit smoking. Her smoking use included Cigarettes. She smoked 0.00 packs per day. She does not have any smokeless tobacco history on file. She reports that she does not drink alcohol or use illicit drugs.  Family History  Problem Relation Age of Onset  . CVA Mother   . Heart attack Father   . Cancer Father     Prostate cancer  . COPD Brother   . Diabetes Sister   . Diabetes Sister   . Cancer Brother     Medications: Patient's Medications  New Prescriptions   No medications on file  Previous Medications   ACETAMINOPHEN (TYLENOL) 500 MG TABLET    Take 500 mg by mouth as needed for pain.    AMLODIPINE (NORVASC) 10 MG TABLET    TAKE ONE-HALF TABLET BY MOUTH ONCE DAILY FOR BLOOD PRESSURE   ASPIRIN 325 MG TABLET    Take 1/2 tablet by mouth daily   HYDROCHLOROTHIAZIDE (HYDRODIURIL) 25 MG TABLET    TAKE ONE TABLET BY MOUTH ONCE DAILY TO CONTROL BLOOD PRESSURE   LANSOPRAZOLE (PREVACID PO)    Take by mouth as needed.   LOSARTAN (COZAAR) 50 MG TABLET    TAKE ONE TABLET BY MOUTH ONCE DAILY   METFORMIN (GLUCOPHAGE) 1000 MG TABLET    TAKE ONE TABLET BY MOUTH TWICE DAILY WITH MEALS FOR DIABETES   METOPROLOL (LOPRESSOR) 100 MG TABLET    TAKE ONE TABLET BY MOUTH ONCE DAILY FOR BLOOD PRESSURE   MULTIPLE VITAMINS-MINERALS (CENTRUM SILVER PO)    Take one tablet once daily   OMEGA-3 FATTY ACIDS (OMEGA 3 PO)    Take one tablet by mouth twice daily for cholesterol   PRAVASTATIN (PRAVACHOL) 40 MG TABLET    TAKE ONE-HALF TABLET BY MOUTH EVERY DAY TO  CONTROL  CHOLESTEROL  Modified Medications   No medications on file  Discontinued Medications   No medications on file     Physical Exam: Filed Vitals:   12/31/12 0838  BP: 126/70  Pulse: 76  Temp: 98.5 F (36.9 C)  TempSrc: Oral  Weight: 145 lb (65.772 kg)  SpO2: 98%  Physical Exam  Constitutional: She is oriented to person, place, and time. She appears well-developed and well-nourished. No distress.  Cardiovascular: Normal rate, regular rhythm, normal heart sounds and intact distal pulses.   Pulmonary/Chest: Effort normal and breath sounds normal. No respiratory distress.  Abdominal: Soft. Bowel sounds are normal. She exhibits no distension. There is no tenderness.  Neurological: She is alert and oriented to person, place, and time.  Skin: Skin is warm and dry.  Bilateral buttocks with 2 areas of follicular inflammation--now dried up     Labs reviewed: Basic Metabolic Panel:  Recent Labs  09/81/19 0832  NA 142  K 4.2  CL 99  CO2 23  GLUCOSE 115*  BUN 18  CREATININE 1.13*  CALCIUM 10.1   Liver Function Tests:  Recent  Labs  04/03/12 0922 12/27/12 0832  AST 14 12  ALT 12 13  ALKPHOS 53 65  BILITOT 0.3 0.3  PROT 7.5 7.2  CBC:  Recent Labs  12/27/12 0832  WBC 3.9  NEUTROABS 1.8  HGB 12.8  HCT 38.1  MCV 89   Lipid Panel:  Recent Labs  04/03/12 0922 12/27/12 0832  HDL 40 41  LDLCALC 81 73  TRIG 174* 199*  CHOLHDL 3.9 3.8   Lab Results  Component Value Date   HGBA1C 6.4* 12/27/2012   Assessment/Plan 1. CKD (chronic kidney disease) stage  3, GFR 30-59 ml/min -newly noted -encouraged continued good diabetic controla -cont losartan for renal protection -cont bp, lipid control  2. DM (diabetes mellitus) type II controlled with renal manifestation -is well controlled based on geriatric goals though she is unhappy with weight gain -cont metformin--may cause some of her loose stool but I think this is primarily glucerna related  3. Essential hypertension, benign -bp at goal with current meds which do include an ARB  4. Hypertriglyceridemia -TG elevated despite pravachol and attempts at diabetic diet -stop pravachol due to cramping anyway -begin tricor (if she can afford it) to help with this--also will not use if she continues with cramping in her legs with it  5. Abdominal pain, right upper quadrant -suspect she may have gallstones--had one attack, but none since -advised to monitor for recurrence or association with fatty meals in which case we will check RUQ [place] -check cmp before next visit  6. Peripheral autonomic neuropathy due to diabetes mellitus - of feet at night, mild sensory loss on exam to light touch, does wear diabetic shoes  7.  Irritable bowel syndrome with constipation:  -encouraged high fiber diet and adequate water intake for bowels  8.  Folliculitis:  Of buttocks--comes and goes, seems to resolve with hydrocortisone cream use   Labs/tests ordered: Orders Placed This Encounter  Procedures  . HM DEXA SCAN    This external order was created through the  Results Console.  Marland Kitchen HM MAMMOGRAPHY    This external order was created through the Results Console.  Marland Kitchen CBC with Differential    Standing Status: Future     Number of Occurrences:      Standing Expiration Date: 12/31/2013  . Comprehensive metabolic panel    Standing Status: Future     Number of Occurrences:      Standing Expiration Date: 12/31/2013  . Lipid panel    Standing Status: Future     Number of Occurrences:      Standing Expiration Date: 12/31/2013  . Hemoglobin A1c    Standing Status: Future     Number of Occurrences:      Standing Expiration Date: 12/31/2013  . Microalbumin/Creatinine Ratio, Urine    Standing Status: Future     Number of Occurrences:      Standing Expiration Date: 12/31/2013   Next appt:  6 mos EV

## 2012-12-31 NOTE — Addendum Note (Signed)
Addended by: Waymond Cera on: 12/31/2012 09:38 AM   Modules accepted: Orders

## 2013-01-18 ENCOUNTER — Encounter: Payer: Self-pay | Admitting: Cardiology

## 2013-04-26 ENCOUNTER — Telehealth: Payer: Self-pay | Admitting: *Deleted

## 2013-04-26 ENCOUNTER — Other Ambulatory Visit: Payer: Self-pay | Admitting: *Deleted

## 2013-04-26 MED ORDER — GEMFIBROZIL 600 MG PO TABS: ORAL_TABLET | ORAL | Status: DC

## 2013-04-26 NOTE — Telephone Encounter (Signed)
We can d/c fenofibrate and try lopid (gemfibrozil) 600mg  po bid for her high triglycerides.  If it, too, is too expensive, will need to figure out alternative at next visit.

## 2013-04-26 NOTE — Telephone Encounter (Signed)
Patient Notified and faxed Rx into [facility]

## 2013-04-26 NOTE — Telephone Encounter (Signed)
Patient called and stated that the Fenofibrate is going to cost her $122.00 and needs something else to be called in because she cannot afford this. Please Advise.  [facility] [place]

## 2013-05-11 ENCOUNTER — Other Ambulatory Visit: Payer: Self-pay | Admitting: Internal Medicine

## 2013-05-28 ENCOUNTER — Other Ambulatory Visit: Payer: Self-pay | Admitting: Internal Medicine

## 2013-07-17 ENCOUNTER — Other Ambulatory Visit: Payer: Medicare Other

## 2013-07-17 DIAGNOSIS — N183 Chronic kidney disease, stage 3 unspecified: Secondary | ICD-10-CM

## 2013-07-17 DIAGNOSIS — E1129 Type 2 diabetes mellitus with other diabetic kidney complication: Secondary | ICD-10-CM

## 2013-07-17 DIAGNOSIS — E781 Pure hyperglyceridemia: Secondary | ICD-10-CM

## 2013-07-18 LAB — CBC WITH DIFFERENTIAL/PLATELET
Basophils Absolute: 0 10*3/uL (ref 0.0–0.2)
Basos: 1 %
Eos: 5 %
Eosinophils Absolute: 0.3 10*3/uL (ref 0.0–0.4)
HCT: 38.8 % (ref 34.0–46.6)
Hemoglobin: 12.9 g/dL (ref 11.1–15.9)
Immature Grans (Abs): 0 10*3/uL (ref 0.0–0.1)
Immature Granulocytes: 0 %
Lymphocytes Absolute: 2.4 10*3/uL (ref 0.7–3.1)
Lymphs: 38 %
MCH: 30.1 pg (ref 26.6–33.0)
MCHC: 33.2 g/dL (ref 31.5–35.7)
MCV: 90 fL (ref 79–97)
Monocytes Absolute: 0.5 10*3/uL (ref 0.1–0.9)
Monocytes: 8 %
Neutrophils Absolute: 3 10*3/uL (ref 1.4–7.0)
Neutrophils Relative %: 48 %
RBC: 4.29 x10E6/uL (ref 3.77–5.28)
RDW: 14.3 % (ref 12.3–15.4)
WBC: 6.2 10*3/uL (ref 3.4–10.8)

## 2013-07-18 LAB — COMPREHENSIVE METABOLIC PANEL
ALT: 13 IU/L (ref 0–32)
AST: 14 IU/L (ref 0–40)
Albumin/Globulin Ratio: 1.9 (ref 1.1–2.5)
Albumin: 4.7 g/dL (ref 3.5–4.8)
Alkaline Phosphatase: 60 IU/L (ref 39–117)
BUN/Creatinine Ratio: 20 (ref 11–26)
BUN: 23 mg/dL (ref 8–27)
CO2: 23 mmol/L (ref 18–29)
Calcium: 10 mg/dL (ref 8.7–10.3)
Chloride: 100 mmol/L (ref 97–108)
Creatinine, Ser: 1.13 mg/dL — ABNORMAL HIGH (ref 0.57–1.00)
GFR calc Af Amer: 56 mL/min/{1.73_m2} — ABNORMAL LOW (ref >59)
GFR calc non Af Amer: 49 mL/min/{1.73_m2} — ABNORMAL LOW (ref >59)
Globulin, Total: 2.5 g/dL (ref 1.5–4.5)
Glucose: 108 mg/dL — ABNORMAL HIGH (ref 65–99)
Potassium: 4.2 mmol/L (ref 3.5–5.2)
Sodium: 141 mmol/L (ref 134–144)
Total Bilirubin: 0.3 mg/dL (ref 0.0–1.2)
Total Protein: 7.2 g/dL (ref 6.0–8.5)

## 2013-07-18 LAB — HEMOGLOBIN A1C
Est. average glucose Bld gHb Est-mCnc: 134 mg/dL
Hgb A1c MFr Bld: 6.3 % — ABNORMAL HIGH (ref 4.8–5.6)

## 2013-07-18 LAB — LIPID PANEL
Chol/HDL Ratio: 5.2 ratio units — ABNORMAL HIGH (ref 0.0–4.4)
Cholesterol, Total: 227 mg/dL — ABNORMAL HIGH (ref 100–199)
HDL: 44 mg/dL (ref >39)
LDL Calculated: 130 mg/dL — ABNORMAL HIGH (ref 0–99)
Triglycerides: 265 mg/dL — ABNORMAL HIGH (ref 0–149)
VLDL Cholesterol Cal: 53 mg/dL — ABNORMAL HIGH (ref 5–40)

## 2013-07-19 ENCOUNTER — Ambulatory Visit (INDEPENDENT_AMBULATORY_CARE_PROVIDER_SITE_OTHER): Payer: Medicare Other | Admitting: Internal Medicine

## 2013-07-19 ENCOUNTER — Encounter: Payer: Self-pay | Admitting: Internal Medicine

## 2013-07-19 VITALS — BP 138/76 | HR 81 | Temp 98.6°F | Resp 10 | Ht 64.0 in | Wt 140.0 lb

## 2013-07-19 DIAGNOSIS — Z789 Other specified health status: Secondary | ICD-10-CM

## 2013-07-19 DIAGNOSIS — Z23 Encounter for immunization: Secondary | ICD-10-CM

## 2013-07-19 DIAGNOSIS — K2100 Gastro-esophageal reflux disease with esophagitis, without bleeding: Secondary | ICD-10-CM | POA: Insufficient documentation

## 2013-07-19 DIAGNOSIS — N183 Chronic kidney disease, stage 3 unspecified: Secondary | ICD-10-CM

## 2013-07-19 DIAGNOSIS — E1149 Type 2 diabetes mellitus with other diabetic neurological complication: Secondary | ICD-10-CM

## 2013-07-19 DIAGNOSIS — E1169 Type 2 diabetes mellitus with other specified complication: Secondary | ICD-10-CM

## 2013-07-19 DIAGNOSIS — I10 Essential (primary) hypertension: Secondary | ICD-10-CM

## 2013-07-19 DIAGNOSIS — IMO0001 Reserved for inherently not codable concepts without codable children: Secondary | ICD-10-CM

## 2013-07-19 DIAGNOSIS — E1129 Type 2 diabetes mellitus with other diabetic kidney complication: Secondary | ICD-10-CM

## 2013-07-19 DIAGNOSIS — E782 Mixed hyperlipidemia: Secondary | ICD-10-CM

## 2013-07-19 DIAGNOSIS — G909 Disorder of the autonomic nervous system, unspecified: Secondary | ICD-10-CM

## 2013-07-19 DIAGNOSIS — E1122 Type 2 diabetes mellitus with diabetic chronic kidney disease: Secondary | ICD-10-CM | POA: Insufficient documentation

## 2013-07-19 DIAGNOSIS — E1143 Type 2 diabetes mellitus with diabetic autonomic (poly)neuropathy: Secondary | ICD-10-CM | POA: Insufficient documentation

## 2013-07-19 MED ORDER — EZETIMIBE 10 MG PO TABS: 10.0000 mg | ORAL_TABLET | Freq: Every day | ORAL | Status: DC

## 2013-07-19 MED ORDER — ZOSTER VACCINE LIVE 19400 UNT/0.65ML ~~LOC~~ SOLR: 0.6500 mL | Freq: Once | SUBCUTANEOUS | Status: DC

## 2013-07-19 MED ORDER — TETANUS-DIPHTH-ACELL PERTUSSIS 5-2.5-18.5 LF-MCG/0.5 IM SUSP: 0.5000 mL | Freq: Once | INTRAMUSCULAR | Status: DC

## 2013-07-19 NOTE — Progress Notes (Signed)
Passed clock drawing 

## 2013-07-19 NOTE — Patient Instructions (Signed)
Fat and Cholesterol Control Diet  Fat and cholesterol levels in your blood and organs are influenced by your diet. High levels of fat and cholesterol may lead to diseases of the heart, small and large blood vessels, gallbladder, liver, and pancreas.  CONTROLLING FAT AND CHOLESTEROL WITH DIET  Although exercise and lifestyle factors are important, your diet is key. That is because certain foods are known to raise cholesterol and others to lower it. The goal is to balance foods for their effect on cholesterol and more importantly, to replace saturated and trans fat with other types of fat, such as monounsaturated fat, polyunsaturated fat, and omega-3 fatty acids.  On average, a person should consume no more than 15 to 17 g of saturated fat daily. Saturated and trans fats are considered "bad" fats, and they will raise LDL cholesterol. Saturated fats are primarily found in animal products such as meats, butter, and cream. However, that does not mean you need to give up all your favorite foods. Today, there are good tasting, low-fat, low-cholesterol substitutes for most of the things you like to eat. Choose low-fat or nonfat alternatives. Choose round or loin cuts of red meat. These types of cuts are lowest in fat and cholesterol. Chicken (without the skin), fish, veal, and ground turkey breast are great choices. Eliminate fatty meats, such as hot dogs and salami. Even shellfish have little or no saturated fat. Have a 3 oz (85 g) portion when you eat lean meat, poultry, or fish.  Trans fats are also called "partially hydrogenated oils." They are oils that have been scientifically manipulated so that they are solid at room temperature resulting in a longer shelf life and improved taste and texture of foods in which they are added. Trans fats are found in stick margarine, some tub margarines, cookies, crackers, and baked goods.   When baking and cooking, oils are a great substitute for butter. The monounsaturated oils are  especially beneficial since it is believed they lower LDL and raise HDL. The oils you should avoid entirely are saturated tropical oils, such as coconut and palm.   Remember to eat a lot from food groups that are naturally free of saturated and trans fat, including fish, fruit, vegetables, beans, grains (barley, rice, couscous, bulgur wheat), and pasta (without cream sauces).   IDENTIFYING FOODS THAT LOWER FAT AND CHOLESTEROL   Soluble fiber may lower your cholesterol. This type of fiber is found in fruits such as apples, vegetables such as broccoli, potatoes, and carrots, legumes such as beans, peas, and lentils, and grains such as barley. Foods fortified with plant sterols (phytosterol) may also lower cholesterol. You should eat at least 2 g per day of these foods for a cholesterol lowering effect.   Read package labels to identify low-saturated fats, trans fat free, and low-fat foods at the supermarket. Select cheeses that have only 2 to 3 g saturated fat per ounce. Use a heart-healthy tub margarine that is free of trans fats or partially hydrogenated oil. When buying baked goods (cookies, crackers), avoid partially hydrogenated oils. Breads and muffins should be made from whole grains (whole-wheat or whole oat flour, instead of "flour" or "enriched flour"). Buy non-creamy canned soups with reduced salt and no added fats.   FOOD PREPARATION TECHNIQUES   Never deep-fry. If you must fry, either stir-fry, which uses very little fat, or use non-stick cooking sprays. When possible, broil, bake, or roast meats, and steam vegetables. Instead of putting butter or margarine on vegetables, use lemon   and herbs, applesauce, and cinnamon (for squash and sweet potatoes). Use nonfat yogurt, salsa, and low-fat dressings for salads.   LOW-SATURATED FAT / LOW-FAT FOOD SUBSTITUTES  Meats / Saturated Fat (g)  · Avoid: Steak, marbled (3 oz/85 g) / 11 g  · Choose: Steak, lean (3 oz/85 g) / 4 g  · Avoid: Hamburger (3 oz/85 g) / 7  g  · Choose: Hamburger, lean (3 oz/85 g) / 5 g  · Avoid: Ham (3 oz/85 g) / 6 g  · Choose: Ham, lean cut (3 oz/85 g) / 2.4 g  · Avoid: Chicken, with skin, dark meat (3 oz/85 g) / 4 g  · Choose: Chicken, skin removed, dark meat (3 oz/85 g) / 2 g  · Avoid: Chicken, with skin, light meat (3 oz/85 g) / 2.5 g  · Choose: Chicken, skin removed, light meat (3 oz/85 g) / 1 g  Dairy / Saturated Fat (g)  · Avoid: Whole milk (1 cup) / 5 g  · Choose: Low-fat milk, 2% (1 cup) / 3 g  · Choose: Low-fat milk, 1% (1 cup) / 1.5 g  · Choose: Skim milk (1 cup) / 0.3 g  · Avoid: Hard cheese (1 oz/28 g) / 6 g  · Choose: Skim milk cheese (1 oz/28 g) / 2 to 3 g  · Avoid: Cottage cheese, 4% fat (1 cup) / 6.5 g  · Choose: Low-fat cottage cheese, 1% fat (1 cup) / 1.5 g  · Avoid: Ice cream (1 cup) / 9 g  · Choose: Sherbet (1 cup) / 2.5 g  · Choose: Nonfat frozen yogurt (1 cup) / 0.3 g  · Choose: Frozen fruit bar / trace  · Avoid: Whipped cream (1 tbs) / 3.5 g  · Choose: Nondairy whipped topping (1 tbs) / 1 g  Condiments / Saturated Fat (g)  · Avoid: Mayonnaise (1 tbs) / 2 g  · Choose: Low-fat mayonnaise (1 tbs) / 1 g  · Avoid: Butter (1 tbs) / 7 g  · Choose: Extra light margarine (1 tbs) / 1 g  · Avoid: Coconut oil (1 tbs) / 11.8 g  · Choose: Olive oil (1 tbs) / 1.8 g  · Choose: Corn oil (1 tbs) / 1.7 g  · Choose: Safflower oil (1 tbs) / 1.2 g  · Choose: Sunflower oil (1 tbs) / 1.4 g  · Choose: Soybean oil (1 tbs) / 2.4 g  · Choose: Canola oil (1 tbs) / 1 g  Document Released: 12/27/2004 Document Revised: 04/23/2012 Document Reviewed: 06/17/2010  [employer]® Patient Information ©2015 [employer]. This information is not intended to replace advice given to you by your health care provider. Make sure you discuss any questions you have with your health care provider.

## 2013-07-19 NOTE — Progress Notes (Signed)
Patient ID: Mandy Perry, female   DOB: January 06, 1941, 73 y.o.   MRN: [id number]   Location:  [facility]  Code Status: needs to do her living will and hcpoa paperwork--discussed today and given advance directive packet  Allergies  Allergen Reactions  . Azithromycin Hives  . Statins     Myalgias-high doses  . Tramadol Nausea Only    Chief Complaint  Patient presents with  . Annual Exam    Yearly check-up, discuss labs (copy printed), EKG completed 10/2012. MMSE 29/30  . Chest Pain    Patient c/o chest discomfort off/on (feels like gas) , last episode was yesterday (lasted for several hours)   . Diarrhea    Side effect of gemfibrozil   . FYI    Colonoscopy is scheduled for August     HPI: Patient is a 73 y.o.  seen in the office today for annual exam.    Says she has a chest discomfort, burps quite a bit, and then goes away.  Has some difficulty getting the air.  Much of the time after meals if eats more than she should.  Tries to be good about eating the proper amt only.  Sometimes takes prevacid.  Started taking an indigestion pill that came with her omega XL supplement.  Bad cholesterol has gone up since stopping gemfibrozil.  Has also been on pravachol before.  Mouth is dry from diuretic and other bp meds.  Biotene did not help her mouth.    Says she hates needles so she doesn't know about getting zostavax.  Agreed to take prescription today.  Also given for tdap.    Scored 29/30 on mmse today missing apple on recall.  Clock was perfect.  She still works for [employer] stressed about this today.  Review of Systems:  Review of Systems  Constitutional: Negative for fever and malaise/fatigue.  HENT: Negative for congestion.   Eyes: Negative for blurred vision.  Respiratory: Negative for shortness of breath.   Cardiovascular: Negative for chest pain.       Pressure in chest improved with burping  Gastrointestinal:  Positive for abdominal pain and diarrhea. Negative for constipation, blood in stool and melena.       RUQ about weekly, comes and goes again  Genitourinary: Negative for dysuria.       Feels she urinates slower than she should  Musculoskeletal: Positive for myalgias. Negative for falls.  Skin: Negative for rash.  Neurological: Negative for dizziness, loss of consciousness, weakness and headaches.  Psychiatric/Behavioral: Negative for memory loss. The patient is nervous/anxious.        Stressed today    Past Medical History  Diagnosis Date  . Diabetes mellitus without complication     D2K currently a goal  . Hyperlipidemia     Followed by PCP  . Essential hypertension, benign   . Solitary pulmonary nodule 2012  . Migraine, unspecified, without mention of intractable migraine without mention of status migrainosus   . Lumbago   . Benign neoplasm of colon   . GERD (gastroesophageal reflux disease)   . Osteoarthrosis, unspecified whether generalized or localized, unspecified site   . Palpitations     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  . Urinary frequency   . Hypertension     Past Surgical History  Procedure Laterality Date  . Abdominal hysterectomy    . Cardiac catheterization  11/15/1994    Patent coronary arteries and normal LV function  .  Cardiac catheterization  11/03/2003    Normal coronary arteries  . Exercise stress test  11/12/1987    Positive  . Transthoracic echocardiogram  05/19/2010    EF >55%, normal-mild  . Colonoscopy  2010    Dr.Bucinni, Normal   . Colonoscopy  2004    Dr.Bucinni    Social History:   reports that she has quit smoking. Her smoking use included Cigarettes. She smoked 0.00 packs per day. She does not have any smokeless tobacco history on file. She reports that she does not drink alcohol or use illicit drugs.  Family History  Problem Relation Age of Onset  . CVA Mother   . Heart attack Father   . Cancer Father     Prostate cancer  . COPD  Brother   . Diabetes Sister   . Diabetes Sister   . Cancer Brother   . Colon cancer Sister   . Colon cancer Sister     Medications: Patient's Medications  New Prescriptions   No medications on file  Previous Medications   ACETAMINOPHEN (TYLENOL) 500 MG TABLET    Take 500 mg by mouth as needed for pain.   AMLODIPINE (NORVASC) 10 MG TABLET    Take 1 tablet (10 mg total) by mouth daily.   ASPIRIN 325 MG TABLET    Take 1/2 tablet by mouth daily   GEMFIBROZIL (LOPID) 600 MG TABLET    Take one tablet by mouth twice daily for high triglycerides   HYDROCHLOROTHIAZIDE (HYDRODIURIL) 25 MG TABLET    Take 1 tablet (25 mg total) by mouth daily.   LANSOPRAZOLE (PREVACID PO)    Take by mouth as needed.   LOSARTAN (COZAAR) 50 MG TABLET    TAKE ONE TABLET BY MOUTH ONCE DAILY   METFORMIN (GLUCOPHAGE) 1000 MG TABLET    TAKE ONE TABLET BY MOUTH TWICE DAILY WITH MEALS   MULTIPLE VITAMINS-MINERALS (CENTRUM SILVER PO)    Take one tablet once daily   OMEGA-3 FATTY ACIDS (OMEGA 3 PO)    Take one tablet by mouth twice daily for cholesterol  Modified Medications   Modified Medication Previous Medication   METOPROLOL (LOPRESSOR) 100 MG TABLET metoprolol (LOPRESSOR) 100 MG tablet      1/2 by mouth daily    TAKE ONE TABLET BY MOUTH ONCE DAILY   TDAP (BOOSTRIX) 5-2.5-18.5 LF-MCG/0.5 INJECTION Tdap (BOOSTRIX) 5-2.5-18.5 LF-MCG/0.5 injection      Inject 0.5 mLs into the muscle once.    Inject 0.5 mLs into the muscle once.  Discontinued Medications   No medications on file     Physical Exam: Filed Vitals:   07/19/13 0848  BP: 138/76  Pulse: 81  Temp: 98.6 F (37 C)  TempSrc: Oral  Resp: 10  Height: 5\' 4"  (1.626 m)  Weight: 140 lb (63.504 kg)  SpO2: 97%  Physical Exam  Constitutional: She is oriented to person, place, and time. She appears well-developed and well-nourished.  HENT:  Head: Normocephalic and atraumatic.  Right Ear: External ear normal.  Left Ear: External ear normal.  Nose: Nose  normal.  Mouth/Throat: Oropharynx is clear and moist. No oropharyngeal exudate.  Eyes: Conjunctivae and EOM are normal. Pupils are equal, round, and reactive to light.  Neck: Normal range of motion. Neck supple. No JVD present. No thyromegaly present.  Cardiovascular: Normal rate, regular rhythm, normal heart sounds and intact distal pulses.   Pulmonary/Chest: Effort normal and breath sounds normal. Right breast exhibits tenderness. Right breast exhibits no inverted nipple, no mass, no  nipple discharge and no skin change. Left breast exhibits no inverted nipple, no mass, no nipple discharge, no skin change and no tenderness. Breasts are symmetrical. There is no breast swelling.  Genitourinary: No breast bleeding.  Refused DRE due to upcoming cscope  Musculoskeletal: Normal range of motion. She exhibits no edema and no tenderness.  Neurological: She is alert and oriented to person, place, and time. She has normal reflexes. No cranial nerve deficit.     Labs reviewed: Basic Metabolic Panel:  Recent Labs  12/27/12 0832 07/17/13 0834  NA 142 141  K 4.2 4.2  CL 99 100  CO2 23 23  GLUCOSE 115* 108*  BUN 18 23  CREATININE 1.13* 1.13*  CALCIUM 10.1 10.0   Liver Function Tests:  Recent Labs  12/27/12 0832 07/17/13 0834  AST 12 14  ALT 13 13  ALKPHOS 65 60  BILITOT 0.3 0.3  PROT 7.2 7.2  CBC:  Recent Labs  12/27/12 0832 07/17/13 0834  WBC 3.9 6.2  NEUTROABS 1.8 3.0  HGB 12.8 12.9  HCT 38.1 38.8  MCV 89 90   Lipid Panel:  Recent Labs  12/27/12 0832 07/17/13 0834  HDL 41 44  LDLCALC 73 130*  TRIG 199* 265*  CHOLHDL 3.8 5.2*   Lab Results  Component Value Date   HGBA1C 6.3* 07/17/2013  has mammogram due Has cscope coming up  Assessment/Plan 1. Gastroesophageal reflux disease with esophagitis -cont use of prevacid -may need some more advice re: dietary modifications to help prevent GERD -discussed freezing fish oil may also help burping  2. DM (diabetes  mellitus) type II controlled with renal manifestation - doing fairly well -encouraged more walking, but cannot do it in the heat--recommend walking indoors - Hemoglobin A1c; Future - ezetimibe (ZETIA) 10 MG tablet; Take 1 tablet (10 mg total) by mouth daily.  Dispense: 30 tablet; Refill: 3 - good control for her age  58. CKD (chronic kidney disease) stage 3, GFR 30-59 ml/min - kidneys have been stable - Basic metabolic panel; Future  4. Essential hypertension, benign -bp satisfactory--pt refuses additional meds and does not even really want her cholesterol medicine today   5. Diabetic autonomic neuropathy associated with type 2 diabetes mellitus -diabetic foot exam done today   6. Mixed hyperlipidemia due to type 2 diabetes mellitus - with hesitancy, she does agree to try zetia for her cholesterol which is well above goal - Lipid panel; Future - ezetimibe (ZETIA) 10 MG tablet; Take 1 tablet (10 mg total) by mouth daily.  Dispense: 30 tablet; Refill: 3  7. Need for zoster vaccination - script given today for zoster vaccine live, PF, (ZOSTAVAX) 65784 UNT/0.65ML injection; Inject 19,400 Units into the skin once.  Dispense: 1 each; Refill: 0 After explaining importance to pt  8. Need for prophylactic vaccination with combined diphtheria-tetanus-pertussis (DTP) vaccine - script given today for  Tdap (BOOSTRIX) 5-2.5-18.5 LF-MCG/0.5 injection; Inject 0.5 mLs into the muscle once.  Dispense: 0.5 mL; Refill: 0  9. History of normal mammogram - is due for this again, has some tenderness of right breast along axillary line, but no palpable masses - MM DIGITAL SCREENING BILATERAL; Future  Labs/tests ordered:   Orders Placed This Encounter  Procedures  . MM DIGITAL SCREENING BILATERAL    Standing Status: Future     Number of Occurrences:      Standing Expiration Date: 09/20/2014    Order Specific Question:  Reason for Exam (SYMPTOM  OR DIAGNOSIS REQUIRED)    Answer:  routine screening     Order Specific Question:  Preferred imaging location?    Answer:  External     Comments:  [facility]  . Basic metabolic panel    Standing Status: Future     Number of Occurrences:      Standing Expiration Date: 07/20/2014  . Hemoglobin A1c    Standing Status: Future     Number of Occurrences:      Standing Expiration Date: 07/20/2014  . Lipid panel    Standing Status: Future     Number of Occurrences:      Standing Expiration Date: 07/20/2014   Next appt:  6 mos with labs before

## 2013-07-20 LAB — MICROALBUMIN / CREATININE URINE RATIO
Creatinine, Ur: 26.7 mg/dL (ref 15.0–278.0)
MICROALB/CREAT RATIO: <11.2 mg/g creat (ref 0.0–30.0)
Microalbumin, Urine: <3 ug/mL (ref 0.0–17.0)

## 2013-07-22 ENCOUNTER — Encounter: Payer: Self-pay | Admitting: *Deleted

## 2013-07-22 LAB — HM MAMMOGRAPHY: HM MAMMO: NORMAL

## 2013-07-25 ENCOUNTER — Encounter: Payer: Self-pay | Admitting: *Deleted

## 2013-08-07 ENCOUNTER — Other Ambulatory Visit: Payer: Self-pay | Admitting: Internal Medicine

## 2013-08-14 LAB — HM COLONOSCOPY

## 2013-08-30 ENCOUNTER — Encounter: Payer: Self-pay | Admitting: Internal Medicine

## 2013-09-02 ENCOUNTER — Encounter: Payer: Self-pay | Admitting: Internal Medicine

## 2013-09-02 ENCOUNTER — Telehealth: Payer: Self-pay | Admitting: Internal Medicine

## 2013-09-06 ENCOUNTER — Other Ambulatory Visit: Payer: Self-pay | Admitting: *Deleted

## 2013-09-06 DIAGNOSIS — E119 Type 2 diabetes mellitus without complications: Secondary | ICD-10-CM

## 2013-09-13 ENCOUNTER — Telehealth: Payer: Self-pay | Admitting: *Deleted

## 2013-09-13 NOTE — Telephone Encounter (Signed)
Received by fax from [place] form for Diabetic shoes and inserts. Placed for Dr. [name] to sign off. [place] # [phone] Fax: [phone]

## 2013-09-14 ENCOUNTER — Other Ambulatory Visit: Payer: Self-pay | Admitting: Internal Medicine

## 2013-09-25 ENCOUNTER — Other Ambulatory Visit: Payer: Self-pay | Admitting: Internal Medicine

## 2013-09-30 ENCOUNTER — Other Ambulatory Visit: Payer: Medicare Other

## 2013-09-30 DIAGNOSIS — N183 Chronic kidney disease, stage 3 unspecified: Secondary | ICD-10-CM

## 2013-09-30 DIAGNOSIS — E782 Mixed hyperlipidemia: Secondary | ICD-10-CM

## 2013-09-30 DIAGNOSIS — E1169 Type 2 diabetes mellitus with other specified complication: Secondary | ICD-10-CM

## 2013-09-30 DIAGNOSIS — E1129 Type 2 diabetes mellitus with other diabetic kidney complication: Secondary | ICD-10-CM

## 2013-10-01 LAB — HEMOGLOBIN A1C
Est. average glucose Bld gHb Est-mCnc: 140 mg/dL
Hgb A1c MFr Bld: 6.5 % — ABNORMAL HIGH (ref 4.8–5.6)

## 2013-10-01 LAB — BASIC METABOLIC PANEL
BUN/Creatinine Ratio: 18 (ref 11–26)
BUN: 18 mg/dL (ref 8–27)
CO2: 25 mmol/L (ref 18–29)
Calcium: 9.9 mg/dL (ref 8.7–10.3)
Chloride: 99 mmol/L (ref 97–108)
Creatinine, Ser: 1.01 mg/dL — ABNORMAL HIGH (ref 0.57–1.00)
GFR calc Af Amer: 64 mL/min/{1.73_m2} (ref >59)
GFR calc non Af Amer: 56 mL/min/{1.73_m2} — ABNORMAL LOW (ref >59)
Glucose: 106 mg/dL — ABNORMAL HIGH (ref 65–99)
Potassium: 4.3 mmol/L (ref 3.5–5.2)
Sodium: 141 mmol/L (ref 134–144)

## 2013-10-01 LAB — LIPID PANEL
Chol/HDL Ratio: 4.6 ratio units — ABNORMAL HIGH (ref 0.0–4.4)
Cholesterol, Total: 187 mg/dL (ref 100–199)
HDL: 41 mg/dL (ref >39)
LDL Calculated: 87 mg/dL (ref 0–99)
Triglycerides: 296 mg/dL — ABNORMAL HIGH (ref 0–149)
VLDL Cholesterol Cal: 59 mg/dL — ABNORMAL HIGH (ref 5–40)

## 2013-10-03 ENCOUNTER — Encounter: Payer: Self-pay | Admitting: Internal Medicine

## 2013-10-03 ENCOUNTER — Ambulatory Visit (INDEPENDENT_AMBULATORY_CARE_PROVIDER_SITE_OTHER): Payer: Medicare Other | Admitting: Internal Medicine

## 2013-10-03 VITALS — BP 126/68 | HR 65 | Temp 98.7°F | Resp 10 | Ht 65.0 in | Wt 140.0 lb

## 2013-10-03 DIAGNOSIS — Z23 Encounter for immunization: Secondary | ICD-10-CM

## 2013-10-03 DIAGNOSIS — B880 Other acariasis: Secondary | ICD-10-CM

## 2013-10-03 DIAGNOSIS — N183 Chronic kidney disease, stage 3 unspecified: Secondary | ICD-10-CM

## 2013-10-03 DIAGNOSIS — R197 Diarrhea, unspecified: Secondary | ICD-10-CM

## 2013-10-03 DIAGNOSIS — E1129 Type 2 diabetes mellitus with other diabetic kidney complication: Secondary | ICD-10-CM

## 2013-10-03 DIAGNOSIS — E782 Mixed hyperlipidemia: Secondary | ICD-10-CM

## 2013-10-03 DIAGNOSIS — E1169 Type 2 diabetes mellitus with other specified complication: Secondary | ICD-10-CM

## 2013-10-03 LAB — BASIC METABOLIC PANEL

## 2013-10-03 LAB — HEMOGLOBIN A1C

## 2013-10-03 LAB — LIPID PANEL

## 2013-10-03 MED ORDER — TETANUS-DIPHTH-ACELL PERTUSSIS 5-2.5-18.5 LF-MCG/0.5 IM SUSP: 0.5000 mL | Freq: Once | INTRAMUSCULAR | Status: DC

## 2013-10-03 MED ORDER — ZOSTER VACCINE LIVE 19400 UNT/0.65ML ~~LOC~~ SOLR: 0.6500 mL | Freq: Once | SUBCUTANEOUS | Status: DC

## 2013-10-03 NOTE — Progress Notes (Signed)
Patient ID: Mandy Perry, female   DOB: 09-29-1940, 73 y.o.   MRN: [id number]   Location:  [facility] [place] [facility]  Code Status: given information on advance directives last time, but she lost it so we gave it to her again  Allergies  Allergen Reactions  . Azithromycin Hives  . Statins     Myalgias-high doses  . Tramadol Nausea Only  . Gemfibrozil Other (See Comments)    Leg cramps at night    Chief Complaint  Patient presents with  . Medical Management of Chronic Issues    2 month follow-up, discuss labs (copy printed)   . Medication Management    Discuss alternative to Zetia (too expensive)   . Rash    Bilateral leg rash x 1 week, no change in daily routine   . Diarrhea    Patient with loose stools x 3-4 months- ? if side effect of metformin     HPI: Patient is a 73 y.o. seen in the office today for medical mgt of chronic diseases.    Had cramps with statins and fibrates.  Zetia is too expensive.  TG still high.  Counseled on diet.  Doesn't eat much meat b/c it bothers her stomach.  Discussed plate method to cut back on carbs.  Wants to continue the zetia after much discussion including about praluent.  Has had leg rash for one week.  Looks like chigger bites.  Discussed hydrocortisone cream for them--is using.  As much worse 3-4 days ago.    Does usually take her metformin after she eats, then gets loose stool.    Review of Systems:  Review of Systems  Constitutional: Negative for fever.  Eyes: Negative for blurred vision.  Cardiovascular: Negative for chest pain and leg swelling.  Gastrointestinal: Positive for diarrhea. Negative for abdominal pain, constipation, blood in stool and melena.  Genitourinary: Negative for dysuria, urgency and frequency.  Musculoskeletal: Negative for falls and myalgias.  Skin: Positive for itching and rash.       Of bilateral ankles  Neurological: Negative for dizziness.  Psychiatric/Behavioral: Negative  for depression and memory loss.    Past Medical History  Diagnosis Date  . Diabetes mellitus without complication     E3P currently a goal  . Hyperlipidemia     Followed by PCP  . Essential hypertension, benign   . Solitary pulmonary nodule 2012  . Migraine, unspecified, without mention of intractable migraine without mention of status migrainosus   . Lumbago   . Benign neoplasm of colon   . GERD (gastroesophageal reflux disease)   . Osteoarthrosis, unspecified whether generalized or localized, unspecified site   . Palpitations     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  . Urinary frequency   . Hypertension     Past Surgical History  Procedure Laterality Date  . Abdominal hysterectomy    . Cardiac catheterization  11/15/1994    Patent coronary arteries and normal LV function  . Cardiac catheterization  11/03/2003    Normal coronary arteries  . Exercise stress test  11/12/1987    Positive  . Transthoracic echocardiogram  05/19/2010    EF >55%, normal-mild  . Colonoscopy  2010    Dr.Bucinni, Normal   . Colonoscopy  2004    Dr.Bucinni    Social History:   reports that she has quit smoking. Her smoking use included Cigarettes. She smoked 0.00 packs per day. She does not have any smokeless tobacco history on file. She  reports that she does not drink alcohol or use illicit drugs.  Family History  Problem Relation Age of Onset  . CVA Mother   . Heart attack Father   . Cancer Father     Prostate cancer  . COPD Brother   . Diabetes Sister   . Diabetes Sister   . Cancer Brother   . Colon cancer Sister   . Colon cancer Sister     Medications: Patient's Medications  New Prescriptions   No medications on file  Previous Medications   ACETAMINOPHEN (TYLENOL) 500 MG TABLET    Take 500 mg by mouth as needed for pain.   AMLODIPINE (NORVASC) 10 MG TABLET    Take 1 tablet (10 mg total) by mouth daily.   ASPIRIN 325 MG TABLET    Take 1/2 tablet by mouth daily   EZETIMIBE (ZETIA) 10 MG  TABLET    Take 1 tablet (10 mg total) by mouth daily.   HYDROCHLOROTHIAZIDE (HYDRODIURIL) 25 MG TABLET    TAKE ONE TABLET BY MOUTH ONCE DAILY   LANSOPRAZOLE (PREVACID PO)    Take by mouth as needed.   LOSARTAN (COZAAR) 50 MG TABLET    TAKE ONE TABLET BY MOUTH ONCE DAILY   METFORMIN (GLUCOPHAGE) 1000 MG TABLET    TAKE ONE TABLET BY MOUTH TWICE DAILY WITH MEALS   METOPROLOL (LOPRESSOR) 100 MG TABLET    1/2 by mouth daily   MULTIPLE VITAMINS-MINERALS (CENTRUM SILVER PO)    Take one tablet once daily   OMEGA-3 FATTY ACIDS (OMEGA 3 PO)    Take one tablet by mouth twice daily for cholesterol   TDAP (BOOSTRIX) 5-2.5-18.5 LF-MCG/0.5 INJECTION    Inject 0.5 mLs into the muscle once.   ZOSTER VACCINE LIVE, PF, (ZOSTAVAX) 76546 UNT/0.65ML INJECTION    Inject 19,400 Units into the skin once.  Modified Medications   No medications on file  Discontinued Medications   No medications on file     Physical Exam: Filed Vitals:   10/03/13 0817  BP: 126/68  Pulse: 65  Temp: 98.7 F (37.1 C)  TempSrc: Oral  Resp: 10  Height: 5\' 5"  (1.651 m)  Weight: 140 lb (63.504 kg)  SpO2: 97%  Physical Exam  Constitutional: She is oriented to person, place, and time. She appears well-developed and well-nourished. No distress.  Cardiovascular: Normal rate, regular rhythm, normal heart sounds and intact distal pulses.   Pulmonary/Chest: Effort normal and breath sounds normal. No respiratory distress.  Abdominal: Soft. Bowel sounds are normal. She exhibits no distension. There is no tenderness.  Musculoskeletal: Normal range of motion. She exhibits no edema and no tenderness.  Neurological: She is alert and oriented to person, place, and time.  Skin: Skin is warm and dry.  Bilateral ankles with papular rash, evidence of excoriation  Psychiatric: She has a normal mood and affect.    Labs reviewed: Basic Metabolic Panel:  Recent Labs  12/27/12 0832 07/17/13 0834 09/30/13 0808  NA 142 141 141  K 4.2 4.2 4.3    CL 99 100 99  CO2 23 23 25   GLUCOSE 115* 108* 106*  BUN 18 23 18   CREATININE 1.13* 1.13* 1.01*  CALCIUM 10.1 10.0 9.9   Liver Function Tests:  Recent Labs  12/27/12 0832 07/17/13 0834  AST 12 14  ALT 13 13  ALKPHOS 65 60  BILITOT 0.3 0.3  PROT 7.2 7.2  CBC:  Recent Labs  12/27/12 0832 07/17/13 0834  WBC 3.9 6.2  NEUTROABS 1.8 3.0  HGB 12.8 12.9  HCT 38.1 38.8  MCV 89 90   Lipid Panel:  Recent Labs  12/27/12 0832 07/17/13 0834 09/30/13 0808  HDL 41 44 41  LDLCALC 73 130* 87  TRIG 199* 265* 296*  CHOLHDL 3.8 5.2* 4.6*   Lab Results  Component Value Date   HGBA1C 6.5* 09/30/2013   Assessment/Plan 1. DM (diabetes mellitus) type II controlled with renal manifestation -slight worsening of hba1c to 6.5 -cont metformin   2. Diarrhea -from taking metformin after meals instead of with--advised to take WITH meals  3. Chigger bites -use hydrocortisone 3-4 times per day to help with itching, getting gradually better  4. CKD (chronic kidney disease) stage 3, GFR 30-59 ml/min -stable, actually a little better today  5. Mixed hyperlipidemia due to type 2 diabetes mellitus -LDL better with zetia but TG continue to worsen -plate method reviewed--she is trying to figure out how to get herself to follow through with this -continue walking at least 3x per week  6. Need for prophylactic vaccination and inoculation against influenza -flu shot given today  Labs/tests ordered:   Orders Placed This Encounter  Procedures  . CBC With differential/Platelet    Standing Status: Future     Number of Occurrences:      Standing Expiration Date: 06/03/2014  . Basic metabolic panel    Standing Status: Future     Number of Occurrences:      Standing Expiration Date: 06/03/2014  . Hemoglobin A1c    Standing Status: Future     Number of Occurrences:      Standing Expiration Date: 06/03/2014  . Lipid panel    Standing Status: Future     Number of Occurrences:      Standing  Expiration Date: 06/03/2014   Next appt:  4 mos

## 2013-10-09 ENCOUNTER — Other Ambulatory Visit: Payer: Self-pay | Admitting: Internal Medicine

## 2013-10-14 ENCOUNTER — Ambulatory Visit (INDEPENDENT_AMBULATORY_CARE_PROVIDER_SITE_OTHER): Payer: Medicare Other | Admitting: Cardiology

## 2013-10-14 ENCOUNTER — Encounter: Payer: Self-pay | Admitting: Cardiology

## 2013-10-14 VITALS — BP 136/70 | HR 58 | Ht 65.0 in | Wt 140.3 lb

## 2013-10-14 DIAGNOSIS — E1129 Type 2 diabetes mellitus with other diabetic kidney complication: Secondary | ICD-10-CM

## 2013-10-14 DIAGNOSIS — R002 Palpitations: Secondary | ICD-10-CM

## 2013-10-14 DIAGNOSIS — E782 Mixed hyperlipidemia: Secondary | ICD-10-CM

## 2013-10-14 DIAGNOSIS — E119 Type 2 diabetes mellitus without complications: Secondary | ICD-10-CM

## 2013-10-14 DIAGNOSIS — N058 Unspecified nephritic syndrome with other morphologic changes: Secondary | ICD-10-CM

## 2013-10-14 DIAGNOSIS — I10 Essential (primary) hypertension: Secondary | ICD-10-CM

## 2013-10-14 DIAGNOSIS — Z8679 Personal history of other diseases of the circulatory system: Secondary | ICD-10-CM

## 2013-10-14 NOTE — Progress Notes (Signed)
PCP: Hollace Kinnier, DO  Clinic Note: Chief Complaint  Patient presents with  . Follow-up    rov 70yr; 1 minute or less episodes of dizziness, pt thinks it's related to medication; no other complaints    HPI: JAYLEE FREEZE is a 73 y.o. female with a PMH below who presents today for annual followup of SVT and chest discomfort. 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 I last saw her in October of 2014 and she was noticing a little bit of decreased exercise tolerance and fatigue at the time. There was some issues of how she was taking her metoprolol. She is nipple 100 mg tablet in the morning and not in the evenings. When I saw her we switched that to make sure that you take it one half tablet twice a day.  Past Medical History  Diagnosis Date  . Diabetes mellitus without complication     C1K currently a goal  . Hyperlipidemia     Followed by PCP  . Essential hypertension, benign   . Solitary pulmonary nodule 2012  . Migraine, unspecified, without mention of intractable migraine without mention of status migrainosus   . Lumbago   . Benign neoplasm of colon   . GERD (gastroesophageal reflux disease)   . Osteoarthrosis, unspecified whether generalized or localized, unspecified site   . Palpitations     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  . Urinary frequency   . Hypertension     Prior Cardiovascular Evaluation Procedural History: Procedure Laterality Date  . Cardiac catheterization  11/15/1994    Patent coronary arteries and normal LV function  . Cardiac catheterization  11/03/2003    Normal coronary arteries  . Exercise stress test  11/12/1987    Positive  . Transthoracic echocardiogram  05/19/2010    EF >55%, normal-mild    Interval History: Since making the change of the beta blocker dosing. She is acting pretty well. She is a lot less tired than she did before his more active doing more exercising. She says she occasionaly has some episodes with the heart races. These are very  fleeting in nature, and usually she just feels occasional skipped beats. She denies any chest tightness/pressure or dyspnea at rest or exertion. When she feels outpatient she will feel a little bit as though she has a better breath. She sometimes gets lightheadedness not associated with palpitations. No dizziness no significant near syncope. No TIA or amaurosis fugax symptoms. No PND, orthopnea or edema.   ROS: A comprehensive was performed. ROS  Current Outpatient Prescriptions on File Prior to Visit  Medication Sig Dispense Refill  . acetaminophen (TYLENOL) 500 MG tablet Take 500 mg by mouth as needed for pain.      Marland Kitchen amLODipine (NORVASC) 10 MG tablet TAKE ONE TABLET BY MOUTH ONCE DAILY  30 tablet  3  . aspirin 325 MG tablet Take 1/2 tablet by mouth daily      . ezetimibe (ZETIA) 10 MG tablet Take 1 tablet (10 mg total) by mouth daily.  30 tablet  3  . hydrochlorothiazide (HYDRODIURIL) 25 MG tablet TAKE ONE TABLET BY MOUTH ONCE DAILY  30 tablet  3  . Lansoprazole (PREVACID PO) Take by mouth as needed.      Marland Kitchen losartan (COZAAR) 50 MG tablet TAKE ONE TABLET BY MOUTH ONCE DAILY  30 tablet  2  . metFORMIN (GLUCOPHAGE) 1000 MG tablet TAKE ONE TABLET BY MOUTH TWICE DAILY WITH MEALS  60 tablet  0  . metoprolol (LOPRESSOR) 100  MG tablet 1/2 by mouth daily      . Multiple Vitamins-Minerals (CENTRUM SILVER PO) Take one tablet once daily      . Omega-3 Fatty Acids (OMEGA 3 PO) Take one tablet by mouth twice daily for cholesterol       No current facility-administered medications on file prior to visit.    ALLERGIES REVIEWED IN EPIC -- no change SOCIAL AND FAMILY HISTORY REVIEWED IN EPIC -- no change  Wt Readings from Last 3 Encounters:  10/14/13 140 lb 4.8 oz (63.64 kg)  10/03/13 140 lb (63.504 kg)  07/19/13 140 lb (63.504 kg)    PHYSICAL EXAM BP 136/70  Pulse 58  Ht 5\' 5"  (1.651 m)  Wt 140 lb 4.8 oz (63.64 kg)  BMI 23.35 kg/m2 General appearance: alert, cooperative, appears stated age,  no distress and otherwise healthy-appearing HEENT: [place]/AT, EOMI, MMM, anicteric sclera Neck: no adenopathy, no carotid bruit and no JVD Lungs: CTAB normal percussion bilaterally and non-labored Heart: RRR, S1& S2 normal, no murmur, click, rub or gallop nondisplaced PMI Abdomen: soft, non-tender; bowel sounds normal; no masses,  no organomegaly Extremities: extremities normal, atraumatic, no cyanosis  or edema Pulses: 2+ and symmetric;  Neurologic: Mental status: Alert, oriented, thought content appropriate Cranial nerves: normal (II-XII grossly intact)   Adult ECG Report  Rate:  58 ;  Rhythm: sinus bradycardia  Narrative Interpretation:  otherwise normal EKG   Recent Labs: Otherwise reviewed in EPIC from 09/30/2013 Lipid Panel     Component Value Date/Time   TRIG 296* 09/30/2013 0808   HDL 41 09/30/2013 0808   CHOLHDL 4.6* 09/30/2013 0808   LDLCALC 87 09/30/2013 0808   ASSESSMENT / PLAN: Stable overall from a cardiac standpoint. Pelvic asymmetry or droop. Blood pressure stable this is looked pretty good with exception of hypertriglyceridemia.  Heart palpitations Very rare episodes of the noticing no PACs or PVCs. It doesn't sound like she is as symptomatic. Continue low-dose beta blocker.  History of PSVT (paroxysmal supraventricular tachycardia) Probably no episodes of PSVT. She had one spell in the past year that spontaneously broke. At this point continue low-dose beta blocker and vagal maneuvers to break the rhythm is a reasonable plan.   Essential hypertension Adequately controlled with beta blocker ARB and calcium channel blocker.  Hypercholesterolemia with hypertriglyceridemia Monitored by PCP. She is on Zetia and omega-3 fatty acids. Question would be does she need to fibrillate or Lovaza.  DM (diabetes mellitus) type II controlled with renal manifestation Monitored by PCP.  She said that her pneumonia once he was up "a bit "    Orders Placed This Encounter    Procedures  . EKG 12-Lead   No orders of the defined types were placed in this encounter.     Followup: One year   Leonie Man, M.D., M.S. Interventional Cardiologist   Pager # [phone]

## 2013-10-14 NOTE — Patient Instructions (Addendum)
YOU MAY USE VAGAL MANEUVERS IF NEED FOR PALPATIONS---- COUGH , BEAR DOWN , DRINK COLD  WATER  CONTINUE  WITH CURRENT MEDICATIONS.  Your physician wants you to follow-up in 12 MONHTS DR [facility]ARD[facility].  You will receive a reminder letter in the mail two months in advance. If you don't receive a letter, please call our office to schedule the follow-up appointment.

## 2013-10-16 ENCOUNTER — Encounter: Payer: Self-pay | Admitting: Cardiology

## 2013-10-16 DIAGNOSIS — R002 Palpitations: Secondary | ICD-10-CM | POA: Insufficient documentation

## 2013-10-16 NOTE — Assessment & Plan Note (Signed)
Probably no episodes of PSVT. She had one spell in the past year that spontaneously broke. At this point continue low-dose beta blocker and vagal maneuvers to break the rhythm is a reasonable plan.

## 2013-10-16 NOTE — Assessment & Plan Note (Signed)
Adequately controlled with beta blocker ARB and calcium channel blocker.

## 2013-10-16 NOTE — Assessment & Plan Note (Signed)
Very rare episodes of the noticing no PACs or PVCs. It doesn't sound like she is as symptomatic. Continue low-dose beta blocker.

## 2013-10-16 NOTE — Assessment & Plan Note (Signed)
Monitored by PCP. She is on Zetia and omega-3 fatty acids. Question would be does she need to fibrillate or Lovaza.

## 2013-10-16 NOTE — Assessment & Plan Note (Signed)
Monitored by PCP.  She said that her pneumonia once he was up "a bit "

## 2013-10-18 ENCOUNTER — Other Ambulatory Visit: Payer: Self-pay | Admitting: Internal Medicine

## 2013-10-25 ENCOUNTER — Other Ambulatory Visit: Payer: Self-pay | Admitting: Internal Medicine

## 2013-11-18 ENCOUNTER — Other Ambulatory Visit: Payer: Self-pay | Admitting: Internal Medicine

## 2013-11-22 ENCOUNTER — Other Ambulatory Visit: Payer: Self-pay | Admitting: *Deleted

## 2013-11-22 MED ORDER — METOPROLOL TARTRATE 100 MG PO TABS: ORAL_TABLET | ORAL | Status: DC

## 2013-11-22 NOTE — Telephone Encounter (Signed)
Patient called and requested to be faxed to pharmacy. Stated that she was taking 1/2 tablets twice daily.

## 2013-12-17 ENCOUNTER — Other Ambulatory Visit: Payer: Self-pay | Admitting: Internal Medicine

## 2013-12-18 ENCOUNTER — Other Ambulatory Visit: Payer: Self-pay | Admitting: Internal Medicine

## 2014-02-03 ENCOUNTER — Other Ambulatory Visit: Payer: Medicare Other

## 2014-02-04 ENCOUNTER — Other Ambulatory Visit: Payer: 59

## 2014-02-04 DIAGNOSIS — E782 Mixed hyperlipidemia: Secondary | ICD-10-CM

## 2014-02-04 DIAGNOSIS — R197 Diarrhea, unspecified: Secondary | ICD-10-CM

## 2014-02-04 DIAGNOSIS — E1169 Type 2 diabetes mellitus with other specified complication: Secondary | ICD-10-CM

## 2014-02-04 DIAGNOSIS — E1129 Type 2 diabetes mellitus with other diabetic kidney complication: Secondary | ICD-10-CM

## 2014-02-05 LAB — CBC WITH DIFFERENTIAL
Basophils Absolute: 0 10*3/uL (ref 0.0–0.2)
Basos: 1 %
Eos: 3 %
Eosinophils Absolute: 0.2 10*3/uL (ref 0.0–0.4)
HCT: 38.2 % (ref 34.0–46.6)
Hemoglobin: 13.5 g/dL (ref 11.1–15.9)
Immature Grans (Abs): 0 10*3/uL (ref 0.0–0.1)
Immature Granulocytes: 0 %
Lymphocytes Absolute: 2.6 10*3/uL (ref 0.7–3.1)
Lymphs: 37 %
MCH: 31 pg (ref 26.6–33.0)
MCHC: 35.3 g/dL (ref 31.5–35.7)
MCV: 88 fL (ref 79–97)
Monocytes Absolute: 0.5 10*3/uL (ref 0.1–0.9)
Monocytes: 7 %
Neutrophils Absolute: 3.7 10*3/uL (ref 1.4–7.0)
Neutrophils Relative %: 52 %
RBC: 4.35 x10E6/uL (ref 3.77–5.28)
RDW: 14.1 % (ref 12.3–15.4)
WBC: 7.1 10*3/uL (ref 3.4–10.8)

## 2014-02-05 LAB — BASIC METABOLIC PANEL
BUN/Creatinine Ratio: 15 (ref 11–26)
BUN: 19 mg/dL (ref 8–27)
CO2: 23 mmol/L (ref 18–29)
Calcium: 9.9 mg/dL (ref 8.7–10.3)
Chloride: 97 mmol/L (ref 97–108)
Creatinine, Ser: 1.25 mg/dL — ABNORMAL HIGH (ref 0.57–1.00)
GFR calc Af Amer: 49 mL/min/{1.73_m2} — ABNORMAL LOW (ref >59)
GFR calc non Af Amer: 43 mL/min/{1.73_m2} — ABNORMAL LOW (ref >59)
Glucose: 99 mg/dL (ref 65–99)
Potassium: 4.4 mmol/L (ref 3.5–5.2)
Sodium: 137 mmol/L (ref 134–144)

## 2014-02-05 LAB — LIPID PANEL
Chol/HDL Ratio: 4 ratio units (ref 0.0–4.4)
Cholesterol, Total: 177 mg/dL (ref 100–199)
HDL: 44 mg/dL (ref >39)
LDL Calculated: 92 mg/dL (ref 0–99)
Triglycerides: 205 mg/dL — ABNORMAL HIGH (ref 0–149)
VLDL Cholesterol Cal: 41 mg/dL — ABNORMAL HIGH (ref 5–40)

## 2014-02-05 LAB — HEMOGLOBIN A1C
Est. average glucose Bld gHb Est-mCnc: 137 mg/dL
Hgb A1c MFr Bld: 6.4 % — ABNORMAL HIGH (ref 4.8–5.6)

## 2014-02-06 ENCOUNTER — Encounter: Payer: Self-pay | Admitting: Internal Medicine

## 2014-02-06 ENCOUNTER — Ambulatory Visit (INDEPENDENT_AMBULATORY_CARE_PROVIDER_SITE_OTHER): Payer: Medicare Other | Admitting: Internal Medicine

## 2014-02-06 VITALS — BP 138/62 | HR 81 | Temp 98.7°F | Ht 65.0 in | Wt 140.0 lb

## 2014-02-06 DIAGNOSIS — E1129 Type 2 diabetes mellitus with other diabetic kidney complication: Secondary | ICD-10-CM

## 2014-02-06 DIAGNOSIS — N058 Unspecified nephritic syndrome with other morphologic changes: Secondary | ICD-10-CM

## 2014-02-06 DIAGNOSIS — B9789 Other viral agents as the cause of diseases classified elsewhere: Principal | ICD-10-CM

## 2014-02-06 DIAGNOSIS — Z5181 Encounter for therapeutic drug level monitoring: Secondary | ICD-10-CM

## 2014-02-06 DIAGNOSIS — N183 Chronic kidney disease, stage 3 unspecified: Secondary | ICD-10-CM

## 2014-02-06 DIAGNOSIS — J069 Acute upper respiratory infection, unspecified: Secondary | ICD-10-CM

## 2014-02-06 DIAGNOSIS — E782 Mixed hyperlipidemia: Secondary | ICD-10-CM

## 2014-02-06 DIAGNOSIS — I10 Essential (primary) hypertension: Secondary | ICD-10-CM

## 2014-02-06 DIAGNOSIS — R350 Frequency of micturition: Secondary | ICD-10-CM

## 2014-02-06 DIAGNOSIS — E1169 Type 2 diabetes mellitus with other specified complication: Secondary | ICD-10-CM

## 2014-02-06 NOTE — Progress Notes (Signed)
Patient ID: Mandy Perry, female   DOB: 09/26/1940, 74 y.o.   MRN: [id number]   Location:  [facility]   Allergies  Allergen Reactions  . Azithromycin Hives  . Statins     Myalgias-high doses  . Tramadol Nausea Only  . Gemfibrozil Other (See Comments)    Leg cramps at night    Chief Complaint  Patient presents with  . Medical Management of Chronic Issues    4 month follow-up for diabetesand cholesterol. Discuss labs (copy printed)   . URI    Sneezing, runny nose, stopped up (at times), cough and congestion since Jan 31, 2014  . Immunizations    Patient refused to update any vaccines today due to cold symptoms   . Results    Discuss genetic test results     HPI: Patient is a 74 y.o.  Black female seen in the office today for med mgt of chronic diseases.  She has an URI today.  Has had for almost a week--using robitussin cough syrup.  Also using nose drops for congestion.  Has had some headaches and takes tylenol.    Still has difficulty with not being able to urinate when she goes into the bathroom.  Has to sit and wait before she will go.  Doesn't happen every time.  Feels like it's frequent, but she does drink a lot of water.     Review of Systems:  Review of Systems  Constitutional: Positive for malaise/fatigue. Negative for fever.  HENT: Positive for congestion. Negative for sore throat.   Respiratory: Positive for cough. Negative for sputum production and shortness of breath.   Cardiovascular: Negative for chest pain.  Gastrointestinal: Negative for abdominal pain.  Genitourinary: Positive for frequency. Negative for dysuria, urgency and hematuria.  Musculoskeletal: Negative for myalgias and falls.  Neurological: Positive for headaches. Negative for dizziness.  Psychiatric/Behavioral: Negative for depression and memory loss.     Past Medical History  Diagnosis Date  . Diabetes mellitus without complication     X7D  currently a goal  . Hyperlipidemia     Followed by PCP  . Essential hypertension, benign   . Solitary pulmonary nodule 2012  . Migraine, unspecified, without mention of intractable migraine without mention of status migrainosus   . Lumbago   . Benign neoplasm of colon   . GERD (gastroesophageal reflux disease)   . Osteoarthrosis, unspecified whether generalized or localized, unspecified site   . Palpitations     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  . Urinary frequency   . Hypertension     Past Surgical History  Procedure Laterality Date  . Abdominal hysterectomy    . Cardiac catheterization  11/15/1994    Patent coronary arteries and normal LV function  . Cardiac catheterization  11/03/2003    Normal coronary arteries  . Exercise stress test  11/12/1987    Positive  . Transthoracic echocardiogram  05/19/2010    EF >55%, normal-mild  . Colonoscopy  2010    Dr.Bucinni, Normal   . Colonoscopy  2004    Dr.Bucinni    Social History:   reports that she has quit smoking. Her smoking use included Cigarettes. She does not have any smokeless tobacco history on file. She reports that she does not drink alcohol or use illicit drugs.  Family History  Problem Relation Age of Onset  . CVA Mother   . Heart attack Father   . Cancer Father     Prostate  cancer  . COPD Brother   . Diabetes Sister   . Diabetes Sister   . Cancer Brother   . Colon cancer Sister   . Colon cancer Sister     Medications: Patient's Medications  New Prescriptions   No medications on file  Previous Medications   ACETAMINOPHEN (TYLENOL) 500 MG TABLET    Take 500 mg by mouth as needed for pain.   AMLODIPINE (NORVASC) 10 MG TABLET    TAKE ONE TABLET BY MOUTH ONCE DAILY   ASPIRIN 325 MG TABLET    Take 1/2 tablet by mouth daily   HYDROCHLOROTHIAZIDE (HYDRODIURIL) 25 MG TABLET    TAKE ONE TABLET BY MOUTH ONCE DAILY   LANSOPRAZOLE (PREVACID PO)    Take by mouth as needed.   LOSARTAN (COZAAR) 50 MG TABLET    TAKE ONE  TABLET BY MOUTH ONCE DAILY   METFORMIN (GLUCOPHAGE) 1000 MG TABLET    TAKE ONE TABLET BY MOUTH TWICE DAILY WITH MEALS   METOPROLOL (LOPRESSOR) 100 MG TABLET    Take 1/2 tablet by mouth twice daily for heart   MULTIPLE VITAMINS-MINERALS (CENTRUM SILVER PO)    Take one tablet once daily   OMEGA-3 FATTY ACIDS (OMEGA 3 PO)    Take one tablet by mouth twice daily for cholesterol   ZETIA 10 MG TABLET    TAKE ONE TABLET BY MOUTH ONCE DAILY  Modified Medications   No medications on file  Discontinued Medications   METOPROLOL (LOPRESSOR) 100 MG TABLET    Take 0.5 tablets (50 mg total) by mouth daily.   TDAP (BOOSTRIX) 5-2.5-18.5 LF-MCG/0.5 INJECTION    Inject 0.5 mLs into the muscle once.     Physical Exam: Filed Vitals:   02/06/14 0813  BP: 138/62  Pulse: 81  Temp: 98.7 F (37.1 C)  TempSrc: Oral  Height: 5\' 5"  (1.651 m)  Weight: 140 lb (63.504 kg)  SpO2: 99%  Physical Exam  Constitutional: She appears well-developed and well-nourished. No distress.  HENT:  Head: Normocephalic and atraumatic.  Cardiovascular: Normal rate, regular rhythm, normal heart sounds and intact distal pulses.   Pulmonary/Chest: Effort normal and breath sounds normal. No respiratory distress.  Abdominal: Bowel sounds are normal.  Musculoskeletal: Normal range of motion.  Lymphadenopathy:    She has no cervical adenopathy.  Skin: Skin is warm and dry.     Labs reviewed: Basic Metabolic Panel:  Recent Labs  07/17/13 0834 09/30/13 0808 02/04/14 0813  NA 141 CANCELED  141 137  K 4.2 CANCELED  4.3 4.4  CL 100 CANCELED  99 97  CO2 23 CANCELED  25 23  GLUCOSE 108* CANCELED  106* 99  BUN 23 CANCELED  18 19  CREATININE 1.13* CANCELED  1.01* 1.25*  CALCIUM 10.0 CANCELED  9.9 9.9   Liver Function Tests:  Recent Labs  07/17/13 0834  AST 14  ALT 13  ALKPHOS 60  BILITOT 0.3  PROT 7.2   No results for input(s): LIPASE, AMYLASE in the last 8760 hours. No results for input(s): AMMONIA in the  last 8760 hours. CBC:  Recent Labs  07/17/13 0834 02/04/14 0813  WBC 6.2 7.1  NEUTROABS 3.0 3.7  HGB 12.9 13.5  HCT 38.8 38.2  MCV 90 88   Lipid Panel:  Recent Labs  07/17/13 0834 09/30/13 0808 02/04/14 0813  HDL 44 CANCELED  41 44  LDLCALC 130* 87 92  TRIG 265* CANCELED  296* 205*  CHOLHDL 5.2* 4.6* 4.0   Lab Results  Component  Value Date   HGBA1C 6.4* 02/04/2014    Assessment/Plan 1. Viral upper respiratory tract infection with cough -discussed conservative mgt  2. 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 CKD (chronic kidney disease) stage 3, GFR 30-59 ml/min -cont losartan and monitor cr due to metformin -cont hydration and avoiding nsaids  4. Mixed hyperlipidemia due to type 2 diabetes mellitus -cont zetia and omega 3s. -has improved with zetia use  5. Essential hypertension, benign -bp is at goal of <140/90  6. Urinary frequency -will plan to refer to urology if she desires at next visit due to difficulty starting to pass her urine when she feels she has to go (? Inadequate emptying vs. Spasms)  7.  Encounter for therapeutic drug monitoring:  Reviewed results with her that losartan and gemfibrozil do potentially interact, but she is no longer gemfibrozil  REFUSED VACCINES TODAY DUE TO ACUTE ILLNESS--DEFER TO NEXT VISIT  Labs/tests ordered:   Orders Placed This Encounter  Procedures  . Basic metabolic panel    Standing Status: Future     Number of Occurrences:      Standing Expiration Date: 10/08/2014    Order Specific Question:  Has the patient fasted?    Answer:  Yes  . Hemoglobin A1c    Standing Status: Future     Number of Occurrences:      Standing Expiration Date: 10/08/2014  . Lipid panel    Standing Status: Future     Number of Occurrences:      Standing Expiration Date: 10/08/2014    Order Specific Question:  Has the patient fasted?    Answer:  Yes  . CBC with  Differential/Platelet    Standing Status: Future     Number of Occurrences:      Standing Expiration Date: 10/08/2014    Next appt:  4 mos with labs before  Deran Barro L. Mikael Skoda, D.O. [facility] [address], [place] [address] Cell Phone (Mon-Fri 8am-5pm):  [phone] On Call:  [phone] & follow prompts after 5pm & weekends Office Phone:  [phone] Office Fax:  [phone]

## 2014-02-10 ENCOUNTER — Other Ambulatory Visit: Payer: Self-pay | Admitting: Internal Medicine

## 2014-02-14 ENCOUNTER — Encounter: Payer: Self-pay | Admitting: Internal Medicine

## 2014-03-25 ENCOUNTER — Other Ambulatory Visit: Payer: Self-pay | Admitting: Internal Medicine

## 2014-04-16 NOTE — Telephone Encounter (Signed)
Opened in error

## 2014-05-14 ENCOUNTER — Other Ambulatory Visit: Payer: Self-pay | Admitting: Nurse Practitioner

## 2014-06-05 ENCOUNTER — Other Ambulatory Visit: Payer: Medicare Other

## 2014-06-05 DIAGNOSIS — E1169 Type 2 diabetes mellitus with other specified complication: Secondary | ICD-10-CM

## 2014-06-05 DIAGNOSIS — E782 Mixed hyperlipidemia: Secondary | ICD-10-CM

## 2014-06-05 DIAGNOSIS — N183 Chronic kidney disease, stage 3 unspecified: Secondary | ICD-10-CM

## 2014-06-05 DIAGNOSIS — E1129 Type 2 diabetes mellitus with other diabetic kidney complication: Secondary | ICD-10-CM

## 2014-06-05 DIAGNOSIS — I10 Essential (primary) hypertension: Secondary | ICD-10-CM

## 2014-06-05 DIAGNOSIS — R350 Frequency of micturition: Secondary | ICD-10-CM

## 2014-06-05 DIAGNOSIS — J069 Acute upper respiratory infection, unspecified: Secondary | ICD-10-CM

## 2014-06-05 DIAGNOSIS — B9789 Other viral agents as the cause of diseases classified elsewhere: Secondary | ICD-10-CM

## 2014-06-06 ENCOUNTER — Other Ambulatory Visit: Payer: Medicare Other

## 2014-06-06 ENCOUNTER — Other Ambulatory Visit: Payer: Self-pay | Admitting: Internal Medicine

## 2014-06-06 LAB — LIPID PANEL
Chol/HDL Ratio: 4.7 ratio units — ABNORMAL HIGH (ref 0.0–4.4)
Cholesterol, Total: 178 mg/dL (ref 100–199)
HDL: 38 mg/dL — ABNORMAL LOW (ref >39)
LDL Calculated: 97 mg/dL (ref 0–99)
Triglycerides: 213 mg/dL — ABNORMAL HIGH (ref 0–149)
VLDL Cholesterol Cal: 43 mg/dL — ABNORMAL HIGH (ref 5–40)

## 2014-06-06 LAB — CBC WITH DIFFERENTIAL/PLATELET
Basophils Absolute: 0 10*3/uL (ref 0.0–0.2)
Basos: 1 %
EOS (ABSOLUTE): 0.1 10*3/uL (ref 0.0–0.4)
Eos: 2 %
Hematocrit: 38.5 % (ref 34.0–46.6)
Hemoglobin: 12.8 g/dL (ref 11.1–15.9)
Immature Grans (Abs): 0 10*3/uL (ref 0.0–0.1)
Immature Granulocytes: 0 %
Lymphocytes Absolute: 2.1 10*3/uL (ref 0.7–3.1)
Lymphs: 43 %
MCH: 30 pg (ref 26.6–33.0)
MCHC: 33.2 g/dL (ref 31.5–35.7)
MCV: 90 fL (ref 79–97)
Monocytes Absolute: 0.4 10*3/uL (ref 0.1–0.9)
Monocytes: 8 %
Neutrophils Absolute: 2.2 10*3/uL (ref 1.4–7.0)
Neutrophils: 46 %
Platelets: 318 10*3/uL (ref 150–379)
RBC: 4.26 x10E6/uL (ref 3.77–5.28)
RDW: 14.4 % (ref 12.3–15.4)
WBC: 4.9 10*3/uL (ref 3.4–10.8)

## 2014-06-06 LAB — BASIC METABOLIC PANEL
BUN/Creatinine Ratio: 17 (ref 11–26)
BUN: 19 mg/dL (ref 8–27)
CO2: 25 mmol/L (ref 18–29)
Calcium: 9.3 mg/dL (ref 8.7–10.3)
Chloride: 96 mmol/L — ABNORMAL LOW (ref 97–108)
Creatinine, Ser: 1.12 mg/dL — ABNORMAL HIGH (ref 0.57–1.00)
GFR calc Af Amer: 56 mL/min/{1.73_m2} — ABNORMAL LOW (ref >59)
GFR calc non Af Amer: 49 mL/min/{1.73_m2} — ABNORMAL LOW (ref >59)
Glucose: 103 mg/dL — ABNORMAL HIGH (ref 65–99)
Potassium: 4.2 mmol/L (ref 3.5–5.2)
Sodium: 136 mmol/L (ref 134–144)

## 2014-06-06 LAB — HEMOGLOBIN A1C
Est. average glucose Bld gHb Est-mCnc: 140 mg/dL
Hgb A1c MFr Bld: 6.5 % — ABNORMAL HIGH (ref 4.8–5.6)

## 2014-06-12 ENCOUNTER — Ambulatory Visit (INDEPENDENT_AMBULATORY_CARE_PROVIDER_SITE_OTHER): Payer: Medicare Other | Admitting: Internal Medicine

## 2014-06-12 ENCOUNTER — Encounter: Payer: Self-pay | Admitting: Internal Medicine

## 2014-06-12 VITALS — BP 122/80 | HR 73 | Temp 99.2°F | Resp 18 | Ht 65.0 in | Wt 137.2 lb

## 2014-06-12 DIAGNOSIS — N3281 Overactive bladder: Secondary | ICD-10-CM

## 2014-06-12 DIAGNOSIS — E1129 Type 2 diabetes mellitus with other diabetic kidney complication: Secondary | ICD-10-CM | POA: Diagnosis not present

## 2014-06-12 DIAGNOSIS — R1031 Right lower quadrant pain: Secondary | ICD-10-CM

## 2014-06-12 DIAGNOSIS — N058 Unspecified nephritic syndrome with other morphologic changes: Secondary | ICD-10-CM

## 2014-06-12 DIAGNOSIS — N183 Chronic kidney disease, stage 3 unspecified: Secondary | ICD-10-CM

## 2014-06-12 DIAGNOSIS — E1169 Type 2 diabetes mellitus with other specified complication: Secondary | ICD-10-CM

## 2014-06-12 DIAGNOSIS — I10 Essential (primary) hypertension: Secondary | ICD-10-CM

## 2014-06-12 DIAGNOSIS — E782 Mixed hyperlipidemia: Secondary | ICD-10-CM | POA: Diagnosis not present

## 2014-06-12 NOTE — Progress Notes (Signed)
Patient ID: Mandy Perry, female   DOB: [birth year], 74 y.o.   MRN: [id number]   Location:  [facility]  Goals of Care: Advanced Directive information Does patient have an advance directive?: No, Would patient like information on creating an advanced directive?: Yes - Educational materials given (at a previous visit, not yet completed)   Chief Complaint  Patient presents with  . Medical Management of Chronic Issues    HPI: Patient is a 74 y.o. black female seen in the office today for med mgt of chronic diseases.    Bladder urgency ongoing.  Has overactive bladder.  Has had to go 2x at night and every 1.5-2 hrs in the daytime.  Sometimes feels she has to go, but nothing or very little will come out.  Had been catheterized and was put on vesicare.  Says she is getting too dried out from it. Discussed myrbetriq alternative.  No leakage of urine.    Zetia is expensive.  Has not really lowered her triglycerides.  LDL has been at goal last 3x.  Failed both gemfibrozil and   Nagging pain in right side some of the time.  Is enough to make her worried that she has an ovarian problem.  Not occuring often and not bad enough to make her run and call me right away.  No noted correlation with bowels.  Thinks it comes more when she is active.  When stops, it lingers a while, then dissipates.  It's aggravating.  Feels like a dull ache.    Has lost her husband in February.  Trying to tolerate it.  Does have other family around--two daughters are close by.  Married 52 years.  Does have dog.  Has eaten a bit less.    Review of Systems:  Review of Systems  Constitutional: Negative for fever and chills.  HENT: Negative for congestion.   Respiratory: Negative for shortness of breath.   Gastrointestinal: Positive for constipation. Negative for abdominal pain, blood in stool and melena.  Genitourinary: Positive for urgency and frequency. Negative for dysuria and  hematuria.       No stress incontinence  Musculoskeletal: Negative for falls.  Skin: Negative for rash.  Neurological: Negative for dizziness.  Psychiatric/Behavioral: Negative for depression and memory loss.       Grieving loss of her husband    Past Medical History  Diagnosis Date  . Diabetes mellitus without complication     Z8H currently a goal  . Hyperlipidemia     Followed by PCP  . Essential hypertension, benign   . Solitary pulmonary nodule 2012  . Migraine, unspecified, without mention of intractable migraine without mention of status migrainosus   . Lumbago   . Benign neoplasm of colon   . GERD (gastroesophageal reflux disease)   . Osteoarthrosis, unspecified whether generalized or localized, unspecified site   . Palpitations     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  . Urinary frequency   . Hypertension     Past Surgical History  Procedure Laterality Date  . Abdominal hysterectomy    . Cardiac catheterization  11/15/1994    Patent coronary arteries and normal LV function  . Cardiac catheterization  11/03/2003    Normal coronary arteries  . Exercise stress test  11/12/1987    Positive  . Transthoracic echocardiogram  05/19/2010    EF >55%, normal-mild  . Colonoscopy  2010    Dr.Bucinni, Normal   . Colonoscopy  2004  Dr.Bucinni    Allergies  Allergen Reactions  . Azithromycin Hives  . Statins     Myalgias-high doses  . Tramadol Nausea Only  . Gemfibrozil Other (See Comments)    Leg cramps at night   Medications: Patient's Medications  New Prescriptions   No medications on file  Previous Medications   ACETAMINOPHEN (TYLENOL) 500 MG TABLET    Take 500 mg by mouth as needed for pain.   AMLODIPINE (NORVASC) 10 MG TABLET    TAKE ONE TABLET BY MOUTH ONCE DAILY   ASPIRIN 325 MG TABLET    Take 1/2 tablet by mouth daily   HYDROCHLOROTHIAZIDE (HYDRODIURIL) 25 MG TABLET    TAKE ONE TABLET BY MOUTH ONCE DAILY   LANSOPRAZOLE (PREVACID PO)    Take by mouth as needed.     LOSARTAN (COZAAR) 50 MG TABLET    TAKE ONE TABLET BY MOUTH ONCE DAILY   METFORMIN (GLUCOPHAGE) 1000 MG TABLET    TAKE ONE TABLET BY MOUTH TWICE DAILY WITH MEALS   METOPROLOL (LOPRESSOR) 100 MG TABLET    TAKE ONE-HALF TABLET BY MOUTH TWICE DAILY.   MULTIPLE VITAMINS-MINERALS (CENTRUM SILVER PO)    Take one tablet once daily   OMEGA-3 FATTY ACIDS (OMEGA 3 PO)    Take one tablet by mouth twice daily for cholesterol   SOLIFENACIN (VESICARE) 5 MG TABLET    Take 5 mg by mouth daily.   ZETIA 10 MG TABLET    TAKE ONE TABLET BY MOUTH ONCE DAILY  Modified Medications   No medications on file  Discontinued Medications   No medications on file    Physical Exam: Filed Vitals:   06/12/14 0825  BP: 122/80  Pulse: 73  Temp: 99.2 F (37.3 C)  TempSrc: Oral  Resp: 18  Height: 5\' 5"  (1.651 m)  Weight: 137 lb 3.2 oz (62.234 kg)  SpO2: 96%   Physical Exam  Constitutional: She is oriented to person, place, and time. She appears well-developed and well-nourished.  Cardiovascular: Normal rate, regular rhythm, normal heart sounds and intact distal pulses.   Pulmonary/Chest: Effort normal and breath sounds normal.  Abdominal: Soft. Bowel sounds are normal. She exhibits no distension and no mass. There is no tenderness. There is no rebound and no guarding. No hernia.  Musculoskeletal: Normal range of motion.  Neurological: She is alert and oriented to person, place, and time.  Skin: Skin is warm and dry.  Psychiatric: She has a normal mood and affect.    Labs reviewed: Basic Metabolic Panel:  Recent Labs  09/30/13 0808 02/04/14 0813 06/05/14 0820  NA CANCELED  141 137 136  K CANCELED  4.3 4.4 4.2  CL CANCELED  99 97 96*  CO2 CANCELED  25 23 25   GLUCOSE CANCELED  106* 99 103*  BUN CANCELED  18 19 19   CREATININE CANCELED  1.01* 1.25* 1.12*  CALCIUM CANCELED  9.9 9.9 9.3   Liver Function Tests:  Recent Labs  07/17/13 0834  AST 14  ALT 13  ALKPHOS 60  BILITOT 0.3  PROT 7.2    No results for input(s): LIPASE, AMYLASE in the last 8760 hours. No results for input(s): AMMONIA in the last 8760 hours. CBC:  Recent Labs  07/17/13 0834 02/04/14 0813 06/05/14 0820  WBC 6.2 7.1 4.9  NEUTROABS 3.0 3.7 2.2  HGB 12.9 13.5  --   HCT 38.8 38.2 38.5  MCV 90 88  --    Lipid Panel:  Recent Labs  09/30/13 0808 02/04/14  0813 06/05/14 0820  CHOL CANCELED  187 177 178  HDL CANCELED  41 44 38*  LDLCALC 87 92 97  TRIG CANCELED  296* 205* 213*  CHOLHDL 4.6* 4.0 4.7*   Lab Results  Component Value Date   HGBA1C 6.5* 06/05/2014    Assessment/Plan 1. Overactive bladder -was started on vesicare by urology, but has had significant anticholinergic side effects including constipation, dry mouth especiallly -will try her on myrbetriq 25mg  daily--2 wks of samples provided -if helping, she is to call us back for a prescription  2. Mixed hyperlipidemia due to type 2 diabetes mellitus -did not tolerate fibrates, statins, and is on zetia, but it is ineffective for her elevated triglycerides -wants to go off all therapy due to cost - Lipid panel; Future  3. Right lower quadrant pain - etiology unclear--?constipation, but she is worried about it being her ovary -exam was benign, but did not have the pain today -will check imaging of abdomen to assess - CT Abdomen Pelvis W Wo Contrast; Future  4. DM (diabetes mellitus) type II controlled with renal manifestation -well controlled, cont diet, exercise, but has been eating out more and counseled that this could have negative effects on her numbers over time - Basic metabolic panel; Future - Hemoglobin A1c; Future  5. CKD (chronic kidney disease) stage 3, GFR 30-59 ml/min -slightly improved this visit, avoid nsaids, monitor - Basic metabolic panel; Future  6. Essential hypertension, benign -bp at goal with current therapy, diet and exercise--walks her dog  Labs/tests ordered:   Orders Placed This Encounter   Procedures  . CT Abdomen Pelvis W Wo Contrast    Standing Status: Future     Number of Occurrences:      Standing Expiration Date: 09/12/2015    Order Specific Question:  Reason for exam:    Answer:  right lower quadrant abdominal pain intermittently    Order Specific Question:  Preferred imaging location?    Answer:  [facility]  . Basic metabolic panel    Standing Status: Future     Number of Occurrences:      Standing Expiration Date: 02/12/2015    Order Specific Question:  Has the patient fasted?    Answer:  Yes  . Hemoglobin A1c    Standing Status: Future     Number of Occurrences:      Standing Expiration Date: 02/12/2015  . Lipid panel    Standing Status: Future     Number of Occurrences:      Standing Expiration Date: 02/12/2015    Order Specific Question:  Has the patient fasted?    Answer:  Yes    Next appt:  4 mos  Glendene Wyer L. Vonceil Upshur, D.O. [facility] [address], [place] [address] Cell Phone (Mon-Fri 8am-5pm):  [phone] On Call:  [phone] & follow prompts after 5pm & weekends Office Phone:  [phone] Office Fax:  [phone]

## 2014-06-18 ENCOUNTER — Other Ambulatory Visit: Payer: Medicare Other

## 2014-06-21 ENCOUNTER — Other Ambulatory Visit: Payer: Self-pay | Admitting: Nurse Practitioner

## 2014-07-01 ENCOUNTER — Other Ambulatory Visit: Payer: Self-pay | Admitting: Internal Medicine

## 2014-07-08 ENCOUNTER — Other Ambulatory Visit: Payer: Self-pay | Admitting: *Deleted

## 2014-07-09 ENCOUNTER — Telehealth: Payer: Self-pay | Admitting: *Deleted

## 2014-07-09 ENCOUNTER — Other Ambulatory Visit: Payer: Self-pay | Admitting: *Deleted

## 2014-07-09 MED ORDER — LOVASTATIN 10 MG PO TABS: ORAL_TABLET | ORAL | Status: DC

## 2014-07-09 NOTE — Telephone Encounter (Signed)
Spoke with patient regarding note from the pharmacist stating that she need to start on a statin. Dr. [name] requested that a call is made to the patient that it is recommended for her to start lovastatin. The patient agreed and a script was sent to the pharmacy of her choice on 07/09/2014 for Lovastatin 10 mg 1 qhs or before dinner.

## 2014-07-17 ENCOUNTER — Other Ambulatory Visit: Payer: Self-pay | Admitting: Internal Medicine

## 2014-09-01 ENCOUNTER — Ambulatory Visit (INDEPENDENT_AMBULATORY_CARE_PROVIDER_SITE_OTHER): Payer: Medicare Other | Admitting: Internal Medicine

## 2014-09-01 ENCOUNTER — Encounter: Payer: Self-pay | Admitting: Internal Medicine

## 2014-09-01 VITALS — BP 140/80 | HR 72 | Temp 97.0°F | Resp 18 | Ht 65.0 in | Wt 137.4 lb

## 2014-09-01 DIAGNOSIS — R194 Change in bowel habit: Secondary | ICD-10-CM | POA: Diagnosis not present

## 2014-09-01 DIAGNOSIS — R300 Dysuria: Secondary | ICD-10-CM

## 2014-09-01 DIAGNOSIS — R350 Frequency of micturition: Secondary | ICD-10-CM

## 2014-09-01 LAB — POCT URINALYSIS DIPSTICK
Bilirubin, UA: NEGATIVE
Glucose, UA: NEGATIVE
Ketones, UA: NEGATIVE
Nitrite, UA: NEGATIVE
Protein, UA: NEGATIVE
Spec Grav, UA: 1.01
Urobilinogen, UA: 0.2
pH, UA: 5

## 2014-09-01 MED ORDER — CIPROFLOXACIN HCL 500 MG PO TABS: 500.0000 mg | ORAL_TABLET | Freq: Two times a day (BID) | ORAL | Status: DC

## 2014-09-01 NOTE — Progress Notes (Signed)
Patient ID: Mandy Perry, female   DOB: 10/08/1940, 74 y.o.   MRN: [id number]   Location:  [facility]  Goals of Care: Advanced Directive information Does patient have an advance directive?: No, Would patient like information on creating an advanced directive?: Yes - Educational materials given   Chief Complaint  Patient presents with  . Acute Visit    ? UTI    HPI: Patient is a 74 y.o. black female seen in the office today for an acute visit for dysuria and increased frequency.  Has had nocturia that is up to 3x now.  No blood seen.  No incontinence.  No suprapubic pain.  No fevers.  Urine dip shows trace blood, moderate leukocytes, negative nitrite.  She's allergic to zithromax. She is certain she has a UTI.    Is drinking a lot of fluids anyway due to her dry mouth.    Still gets her right sided abdominal pain that insurance would not cover ultrasound for at a previous visit.  Has now been having diarrhea alternating with constipation which is a change for her over the past month.    Review of Systems:  Review of Systems  Constitutional: Positive for malaise/fatigue. Negative for fever and chills.  Eyes: Negative for blurred vision.       Glasses  Respiratory: Negative for shortness of breath.   Cardiovascular: Negative for chest pain and leg swelling.  Gastrointestinal: Positive for abdominal pain, diarrhea and constipation. Negative for heartburn, nausea, vomiting, blood in stool and melena.  Genitourinary: Positive for dysuria and frequency. Negative for urgency.  Musculoskeletal: Negative for myalgias and falls.  Skin: Negative for itching and rash.  Neurological: Negative for dizziness, loss of consciousness and headaches.  Psychiatric/Behavioral: Negative for depression and memory loss.    Past Medical History  Diagnosis Date  . Diabetes mellitus without complication     [id number] currently a goal  . Hyperlipidemia     Followed by  PCP  . Essential hypertension, benign   . Solitary pulmonary nodule 2012  . Migraine, unspecified, without mention of intractable migraine without mention of status migrainosus   . Lumbago   . Benign neoplasm of colon   . GERD (gastroesophageal reflux disease)   . Osteoarthrosis, unspecified whether generalized or localized, unspecified site   . Palpitations     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  . Urinary frequency   . Hypertension     Past Surgical History  Procedure Laterality Date  . Abdominal hysterectomy    . Cardiac catheterization  11/15/1994    Patent coronary arteries and normal LV function  . Cardiac catheterization  11/03/2003    Normal coronary arteries  . Exercise stress test  11/12/1987    Positive  . Transthoracic echocardiogram  05/19/2010    EF >55%, normal-mild  . Colonoscopy  2010    Dr.Bucinni, Normal   . Colonoscopy  2004    Dr.Bucinni    Allergies  Allergen Reactions  . Azithromycin Hives  . Statins     Myalgias-high doses  . Tramadol Nausea Only  . Gemfibrozil Other (See Comments)    Leg cramps at night   Medications: Patient's Medications  New Prescriptions   No medications on file  Previous Medications   ACETAMINOPHEN (TYLENOL) 500 MG TABLET    Take 500 mg by mouth as needed for pain.   AMLODIPINE (NORVASC) 10 MG TABLET    TAKE ONE TABLET BY MOUTH ONCE DAILY  ASPIRIN 325 MG TABLET    Take 1/2 tablet by mouth daily   HYDROCHLOROTHIAZIDE (HYDRODIURIL) 25 MG TABLET    TAKE ONE TABLET BY MOUTH ONCE DAILY   LANSOPRAZOLE (PREVACID PO)    Take by mouth as needed.   LOSARTAN (COZAAR) 50 MG TABLET    TAKE ONE TABLET BY MOUTH ONCE DAILY   LOVASTATIN (MEVACOR) 10 MG TABLET    Take 1 tablet with evening meal or at bed time.   METFORMIN (GLUCOPHAGE) 1000 MG TABLET    TAKE ONE TABLET BY MOUTH TWICE DAILY WITH MEALS   METOPROLOL (LOPRESSOR) 100 MG TABLET    TAKE ONE-HALF TABLET BY MOUTH TWICE DAILY   MULTIPLE VITAMINS-MINERALS (CENTRUM SILVER PO)    Take one  tablet once daily   OMEGA-3 FATTY ACIDS (OMEGA 3 PO)    Take one tablet by mouth twice daily for cholesterol   SOLIFENACIN (VESICARE) 5 MG TABLET    Take 5 mg by mouth daily.   TOLTERODINE (DETROL LA) 4 MG 24 HR CAPSULE    Take 4 mg by mouth daily.  Modified Medications   No medications on file  Discontinued Medications   No medications on file    Physical Exam: Filed Vitals:   09/01/14 1153  BP: 140/80  Pulse: 72  Temp: 97 F (36.1 C)  TempSrc: Oral  Resp: 18  Height: 5\' 5"  (1.651 m)  Weight: 137 lb 6.4 oz (62.324 kg)  SpO2: 96%   Physical Exam  Constitutional: She is oriented to person, place, and time. She appears well-developed and well-nourished. No distress.  Cardiovascular: Normal rate, regular rhythm, normal heart sounds and intact distal pulses.   Pulmonary/Chest: Effort normal and breath sounds normal. No respiratory distress.  Abdominal: Soft. Bowel sounds are normal. She exhibits no distension and no mass. There is no tenderness. There is no rebound and no guarding.  Genitourinary:  No suprapubic tenderness  Musculoskeletal: Normal range of motion.  Neurological: She is alert and oriented to person, place, and time.  Skin: Skin is warm and dry.    Labs reviewed: Basic Metabolic Panel:  Recent Labs  09/30/13 0808 02/04/14 0813 06/05/14 0820  NA CANCELED  141 137 136  K CANCELED  4.3 4.4 4.2  CL CANCELED  99 97 96*  CO2 CANCELED  25 23 25   GLUCOSE CANCELED  106* 99 103*  BUN CANCELED  18 19 19   CREATININE CANCELED  1.01* 1.25* 1.12*  CALCIUM CANCELED  9.9 9.9 9.3   Liver Function Tests: No results for input(s): AST, ALT, ALKPHOS, BILITOT, PROT, ALBUMIN in the last 8760 hours. No results for input(s): LIPASE, AMYLASE in the last 8760 hours. No results for input(s): AMMONIA in the last 8760 hours. CBC:  Recent Labs  02/04/14 0813 06/05/14 0820  WBC 7.1 4.9  NEUTROABS 3.7 2.2  HGB 13.5  --   HCT 38.2 38.5  MCV 88  --    Lipid  Panel:  Recent Labs  09/30/13 0808 02/04/14 0813 06/05/14 0820  CHOL CANCELED  187 177 178  HDL CANCELED  41 44 38*  LDLCALC 87 92 97  TRIG CANCELED  296* 205* 213*  CHOLHDL 4.6* 4.0 4.7*   Lab Results  Component Value Date   HGBA1C 6.5* 06/05/2014    Assessment/Plan 1. Urinary frequency - POCT urinalysis dipstick - Urine culture -due to some positive findings on urine dip, culture was sent off and she was started on po cipro for complicated UTI -f/u with her urologist  also especially if symptoms are ongoing   2. Dysuria - suspect she does have a UTI and will treat accordingly -also my have OAB and atropic vaginitis - Urine culture  3.  Change in bowel habits -requests referral to GI to further evaluate diarrhea alternating with constipation for the past month -probably needs cscope -has fh/o colon cancer  Labs/tests ordered:   Orders Placed This Encounter  Procedures  . Urine culture  . POCT urinalysis dipstick  GI referral  Next appt:  Keep oct appt, but return if not getting better from uti  Browning Southwood L. Siler Mavis, D.O. [facility] [address], [place] [address] Cell Phone (Mon-Fri 8am-5pm):  [phone] On Call:  [phone] & follow prompts after 5pm & weekends Office Phone:  [phone] Office Fax:  [phone]

## 2014-09-01 NOTE — Patient Instructions (Addendum)
Take the align probiotic for 2 weeks to help with diarrhea and prevent worsening of diarrhea from the antibiotic for your UTI (cipro).    I have referred you to GI for your abdominal pain and diarrhea alternating with constipation over the past month.  Urinary Tract Infection Urinary tract infections (UTIs) can develop anywhere along your urinary tract. Your urinary tract is your body's drainage system for removing wastes and extra water. Your urinary tract includes two kidneys, two ureters, a bladder, and a urethra. Your kidneys are a pair of bean-shaped organs. Each kidney is about the size of your fist. They are located below your ribs, one on each side of your spine. CAUSES Infections are caused by microbes, which are microscopic organisms, including fungi, viruses, and bacteria. These organisms are so small that they can only be seen through a microscope. Bacteria are the microbes that most commonly cause UTIs. SYMPTOMS  Symptoms of UTIs may vary by age and gender of the patient and by the location of the infection. Symptoms in young women typically include a frequent and intense urge to urinate and a painful, burning feeling in the bladder or urethra during urination. Older women and men are more likely to be tired, shaky, and weak and have muscle aches and abdominal pain. A fever may mean the infection is in your kidneys. Other symptoms of a kidney infection include pain in your back or sides below the ribs, nausea, and vomiting. DIAGNOSIS To diagnose a UTI, your caregiver will ask you about your symptoms. Your caregiver also will ask to provide a urine sample. The urine sample will be tested for bacteria and white blood cells. White blood cells are made by your body to help fight infection. TREATMENT  Typically, UTIs can be treated with medication. Because most UTIs are caused by a bacterial infection, they usually can be treated with the use of antibiotics. The choice of antibiotic and length  of treatment depend on your symptoms and the type of bacteria causing your infection. HOME CARE INSTRUCTIONS  If you were prescribed antibiotics, take them exactly as your caregiver instructs you. Finish the medication even if you feel better after you have only taken some of the medication.  Drink enough water and fluids to keep your urine clear or pale yellow.  Avoid caffeine, tea, and carbonated beverages. They tend to irritate your bladder.  Empty your bladder often. Avoid holding urine for long periods of time.  Empty your bladder before and after sexual intercourse.  After a bowel movement, women should cleanse from front to back. Use each tissue only once. SEEK MEDICAL CARE IF:   You have back pain.  You develop a fever.  Your symptoms do not begin to resolve within 3 days. SEEK IMMEDIATE MEDICAL CARE IF:   You have severe back pain or lower abdominal pain.  You develop chills.  You have nausea or vomiting.  You have continued burning or discomfort with urination. MAKE SURE YOU:   Understand these instructions.  Will watch your condition.  Will get help right away if you are not doing well or get worse. Document Released: 10/06/2004 Document Revised: 06/28/2011 Document Reviewed: 02/04/2011 [facility] Patient Information 2015 [place], [place]. This information is not intended to replace advice given to you by your health care provider. Make sure you discuss any questions you have with your health care provider.

## 2014-09-03 ENCOUNTER — Ambulatory Visit (INDEPENDENT_AMBULATORY_CARE_PROVIDER_SITE_OTHER): Payer: Medicare Other

## 2014-09-03 VITALS — HR 75 | Temp 98.5°F | Resp 18 | Wt 136.6 lb

## 2014-09-03 DIAGNOSIS — Z23 Encounter for immunization: Secondary | ICD-10-CM | POA: Diagnosis not present

## 2014-09-03 DIAGNOSIS — S61209A Unspecified open wound of unspecified finger without damage to nail, initial encounter: Secondary | ICD-10-CM | POA: Diagnosis not present

## 2014-09-03 DIAGNOSIS — S61219A Laceration without foreign body of unspecified finger without damage to nail, initial encounter: Secondary | ICD-10-CM

## 2014-09-03 LAB — URINE CULTURE

## 2014-09-03 NOTE — Progress Notes (Signed)
Patient called in because she cut her finger doing  yard work and needed a tetanus.

## 2014-09-13 ENCOUNTER — Encounter: Payer: Self-pay | Admitting: Internal Medicine

## 2014-10-10 ENCOUNTER — Other Ambulatory Visit: Payer: Self-pay | Admitting: Internal Medicine

## 2014-10-22 ENCOUNTER — Other Ambulatory Visit: Payer: Medicare Other

## 2014-10-24 ENCOUNTER — Encounter: Payer: Self-pay | Admitting: Internal Medicine

## 2014-10-24 ENCOUNTER — Ambulatory Visit: Payer: Medicare Other | Admitting: Internal Medicine

## 2014-10-30 ENCOUNTER — Other Ambulatory Visit: Payer: Medicare Other

## 2014-11-10 ENCOUNTER — Other Ambulatory Visit: Payer: Self-pay | Admitting: Internal Medicine

## 2014-11-17 ENCOUNTER — Telehealth: Payer: Self-pay | Admitting: *Deleted

## 2014-11-17 ENCOUNTER — Other Ambulatory Visit: Payer: Medicare Other

## 2014-11-17 DIAGNOSIS — E1169 Type 2 diabetes mellitus with other specified complication: Secondary | ICD-10-CM

## 2014-11-17 DIAGNOSIS — N183 Chronic kidney disease, stage 3 unspecified: Secondary | ICD-10-CM

## 2014-11-17 DIAGNOSIS — R300 Dysuria: Secondary | ICD-10-CM

## 2014-11-17 DIAGNOSIS — E782 Mixed hyperlipidemia: Secondary | ICD-10-CM

## 2014-11-17 DIAGNOSIS — E1129 Type 2 diabetes mellitus with other diabetic kidney complication: Secondary | ICD-10-CM

## 2014-11-17 NOTE — Telephone Encounter (Signed)
Patient came today to have labs drawn for appointment this week. Complaining of Burning with urination with Dee. Dee had patient fill out Triage form but left before being addressed. Dee already collected a urine from patient before leaving. Ordered U/A and Cx.

## 2014-11-18 LAB — LIPID PANEL
Chol/HDL Ratio: 3.5 ratio units (ref 0.0–4.4)
Cholesterol, Total: 139 mg/dL (ref 100–199)
HDL: 40 mg/dL (ref >39)
LDL Calculated: 70 mg/dL (ref 0–99)
Triglycerides: 145 mg/dL (ref 0–149)
VLDL Cholesterol Cal: 29 mg/dL (ref 5–40)

## 2014-11-18 LAB — BASIC METABOLIC PANEL
BUN/Creatinine Ratio: 15 (ref 11–26)
BUN: 15 mg/dL (ref 8–27)
CO2: 25 mmol/L (ref 18–29)
Calcium: 9.5 mg/dL (ref 8.7–10.3)
Chloride: 96 mmol/L — ABNORMAL LOW (ref 97–106)
Creatinine, Ser: 1.03 mg/dL — ABNORMAL HIGH (ref 0.57–1.00)
GFR calc Af Amer: 62 mL/min/{1.73_m2} (ref >59)
GFR calc non Af Amer: 54 mL/min/{1.73_m2} — ABNORMAL LOW (ref >59)
Glucose: 101 mg/dL — ABNORMAL HIGH (ref 65–99)
Potassium: 4.5 mmol/L (ref 3.5–5.2)
Sodium: 137 mmol/L (ref 136–144)

## 2014-11-18 LAB — HEMOGLOBIN A1C
Est. average glucose Bld gHb Est-mCnc: 131 mg/dL
Hgb A1c MFr Bld: 6.2 % — ABNORMAL HIGH (ref 4.8–5.6)

## 2014-11-18 LAB — URINALYSIS
Bilirubin, UA: NEGATIVE
Glucose, UA: NEGATIVE
Ketones, UA: NEGATIVE
Leukocytes, UA: NEGATIVE
Nitrite, UA: NEGATIVE
Protein, UA: NEGATIVE
RBC, UA: NEGATIVE
Specific Gravity, UA: 1.013 (ref 1.005–1.030)
Urobilinogen, Ur: 0.2 mg/dL (ref 0.2–1.0)
pH, UA: 6.5 (ref 5.0–7.5)

## 2014-11-19 LAB — URINE CULTURE: Organism ID, Bacteria: NO GROWTH

## 2014-11-20 ENCOUNTER — Encounter: Payer: Self-pay | Admitting: Internal Medicine

## 2014-11-20 ENCOUNTER — Ambulatory Visit (INDEPENDENT_AMBULATORY_CARE_PROVIDER_SITE_OTHER): Payer: Medicare Other | Admitting: Internal Medicine

## 2014-11-20 VITALS — BP 142/80 | HR 77 | Temp 98.4°F | Ht 65.0 in | Wt 138.8 lb

## 2014-11-20 DIAGNOSIS — E1169 Type 2 diabetes mellitus with other specified complication: Secondary | ICD-10-CM

## 2014-11-20 DIAGNOSIS — N952 Postmenopausal atrophic vaginitis: Secondary | ICD-10-CM | POA: Diagnosis not present

## 2014-11-20 DIAGNOSIS — N183 Chronic kidney disease, stage 3 unspecified: Secondary | ICD-10-CM

## 2014-11-20 DIAGNOSIS — G8929 Other chronic pain: Secondary | ICD-10-CM

## 2014-11-20 DIAGNOSIS — Z23 Encounter for immunization: Secondary | ICD-10-CM | POA: Diagnosis not present

## 2014-11-20 DIAGNOSIS — E782 Mixed hyperlipidemia: Secondary | ICD-10-CM | POA: Diagnosis not present

## 2014-11-20 DIAGNOSIS — N3281 Overactive bladder: Secondary | ICD-10-CM | POA: Diagnosis not present

## 2014-11-20 DIAGNOSIS — E1121 Type 2 diabetes mellitus with diabetic nephropathy: Secondary | ICD-10-CM | POA: Diagnosis not present

## 2014-11-20 DIAGNOSIS — R1011 Right upper quadrant pain: Principal | ICD-10-CM

## 2014-11-20 DIAGNOSIS — R1010 Upper abdominal pain, unspecified: Secondary | ICD-10-CM

## 2014-11-20 MED ORDER — AMLODIPINE BESYLATE 10 MG PO TABS: 10.0000 mg | ORAL_TABLET | Freq: Every day | ORAL | Status: DC

## 2014-11-20 MED ORDER — ESTROGENS, CONJUGATED 0.625 MG/GM VA CREA: 1.0000 | TOPICAL_CREAM | VAGINAL | Status: DC

## 2014-11-20 MED ORDER — ZOSTER VACCINE LIVE 19400 UNT/0.65ML ~~LOC~~ SOLR: 0.6500 mL | Freq: Once | SUBCUTANEOUS | Status: DC

## 2014-11-20 NOTE — Progress Notes (Signed)
Patient ID: Mandy Perry, female   DOB: July 13, 1940, 74 y.o.   MRN: [id number]   Location: [place] Provider: Rexene Edison. [name], D.O., C.M.D.  Code Status: need to determine Goals of Care: Advanced Directive information Does patient have an advance directive?: No, Would patient like information on creating an advanced directive?: Yes - Educational materials given (Given at previous visit )  Chief Complaint  Patient presents with  . Medical Management of Chronic Issues    4 month follow-up, discuss labs (copy printed), DM foot exam due, and onging pain on right side of abdomen (off/on),   . Medication Refill    Renew Norvasc- out x 4 days     HPI: Patient is a 74 y.o. female seen in the office today for med mgt.    Still having the abdominal pain on her right side at time.  Wants testing done to figure out what's wrong.  Remains intermittent.  Hasn't had to go to emergency room with pain.  Takes tylenol and that does help it.  Is aggravating to her.  Has not associated it with BMs.  Right upper quadrant.  3 years ago, no gallstones.    She loves her sweets.  Says she can't resist.  Hba1c better this time.  Cholesterol all improved dramatically.  Taking her mevacor.  Also using less grease.  Has missed her amlodipine for 4 days so bp up a little bit.  Needs renewal.  Had lost weight, but has gained back to her baseline.  Urinary frequency and dysuria and symptoms went away right after she was tested.  She is on premarin inserted three times a week in vagina.    Review of Systems:  Review of Systems  Constitutional: Negative for fever, chills, weight loss and malaise/fatigue.  HENT: Negative for hearing loss.   Eyes: Negative for blurred vision.  Respiratory: Negative for shortness of breath.   Cardiovascular: Negative for chest pain.  Gastrointestinal: Positive for abdominal pain. Negative for heartburn, nausea, vomiting, diarrhea, constipation, blood in stool and melena.   Genitourinary: Positive for urgency and frequency. Negative for dysuria, hematuria and flank pain.  Musculoskeletal: Negative for myalgias and falls.  Skin: Negative for rash.  Neurological: Negative for dizziness and weakness.  Psychiatric/Behavioral: Negative for depression and memory loss.    Past Medical History  Diagnosis Date  . Diabetes mellitus without complication ([place])     [id number] currently a goal  . Hyperlipidemia     Followed by PCP  . Essential hypertension, benign   . Solitary pulmonary nodule 2012  . Migraine, unspecified, without mention of intractable migraine without mention of status migrainosus   . Lumbago   . Benign neoplasm of colon   . GERD (gastroesophageal reflux disease)   . Osteoarthrosis, unspecified whether generalized or localized, unspecified site   . Palpitations     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  . Urinary frequency   . Hypertension     Past Surgical History  Procedure Laterality Date  . Abdominal hysterectomy    . Cardiac catheterization  11/15/1994    Patent coronary arteries and normal LV function  . Cardiac catheterization  11/03/2003    Normal coronary arteries  . Exercise stress test  11/12/1987    Positive  . Transthoracic echocardiogram  05/19/2010    EF >55%, normal-mild  . Colonoscopy  2010    Dr.Bucinni, Normal   . Colonoscopy  2004    Dr.Bucinni    Allergies  Allergen Reactions  .  Azithromycin Hives  . Statins     Myalgias-high doses  . Tramadol Nausea Only  . Gemfibrozil Other (See Comments)    Leg cramps at night      Medication List       This list is accurate as of: 11/20/14  8:57 AM.  Always use your most recent med list.               acetaminophen 500 MG tablet  Commonly known as:  TYLENOL  Take 500 mg by mouth as needed for pain.     amLODipine 10 MG tablet  Commonly known as:  NORVASC  TAKE ONE TABLET BY MOUTH ONCE DAILY     aspirin 325 MG tablet  Take 1/2 tablet by mouth daily     CENTRUM  SILVER PO  Take one tablet once daily     hydrochlorothiazide 25 MG tablet  Commonly known as:  HYDRODIURIL  TAKE ONE TABLET BY MOUTH ONCE DAILY     losartan 50 MG tablet  Commonly known as:  COZAAR  TAKE ONE TABLET BY MOUTH ONCE DAILY     lovastatin 10 MG tablet  Commonly known as:  MEVACOR  TAKE ONE TABLET BY MOUTH ONCE DAILY WITH  EVENING  MEAL  OR  AT  BEDTIME     metFORMIN 1000 MG tablet  Commonly known as:  GLUCOPHAGE  TAKE ONE TABLET BY MOUTH TWICE DAILY WITH MEALS     metoprolol 100 MG tablet  Commonly known as:  LOPRESSOR  TAKE ONE-HALF TABLET BY MOUTH TWICE DAILY     OMEGA 3 PO  Take one tablet by mouth twice daily for cholesterol     PREVACID PO  Take by mouth as needed.     tolterodine 4 MG 24 hr capsule  Commonly known as:  DETROL LA  Take 4 mg by mouth daily.     zoster vaccine live (PF) 19400 UNT/0.65ML injection  Commonly known as:  ZOSTAVAX  Inject 19,400 Units into the skin once.        Health Maintenance  Topic Date Due  . OPHTHALMOLOGY EXAM  01/11/1950  . PNA vac Low Risk Adult (2 of 2 - PPSV23) 12/31/2013  . FOOT EXAM  07/20/2014  . URINE MICROALBUMIN  07/20/2014  . INFLUENZA VACCINE  08/11/2014  . ZOSTAVAX  01/11/2015 (Originally 01/10/2001)  . HEMOGLOBIN A1C  05/17/2015  . MAMMOGRAM  07/23/2015  . COLONOSCOPY  08/15/2023  . TETANUS/TDAP  09/02/2024  . DEXA SCAN  Completed    Physical Exam: Filed Vitals:   11/20/14 0840  BP: 142/80  Pulse: 77  Temp: 98.4 F (36.9 C)  TempSrc: Oral  Height: 5\' 5"  (1.651 m)  Weight: 138 lb 12.8 oz (62.959 kg)  SpO2: 97%   Body mass index is 23.1 kg/(m^2). Physical Exam  Constitutional: She is oriented to person, place, and time. She appears well-developed and well-nourished. No distress.  Cardiovascular: Normal rate, regular rhythm, normal heart sounds and intact distal pulses.   Pulmonary/Chest: Effort normal and breath sounds normal. No respiratory distress.  Abdominal: Soft. Bowel sounds are  normal. She exhibits no distension and no mass. There is tenderness. There is no rebound and no guarding. No hernia.  Of right upper quadrant and midabdomen on exam; negative Murphy's and no rebound or guarding  Musculoskeletal: Normal range of motion.  Neurological: She is alert and oriented to person, place, and time.  Skin: Skin is warm and dry.  Psychiatric: She has a normal mood  and affect.    Labs reviewed: Basic Metabolic Panel:  Recent Labs  02/04/14 0813 06/05/14 0820 11/17/14 0944  NA 137 136 137  K 4.4 4.2 4.5  CL 97 96* 96*  CO2 23 25 25   GLUCOSE 99 103* 101*  BUN 19 19 15   CREATININE 1.25* 1.12* 1.03*  CALCIUM 9.9 9.3 9.5   Liver Function Tests: No results for input(s): AST, ALT, ALKPHOS, BILITOT, PROT, ALBUMIN in the last 8760 hours. No results for input(s): LIPASE, AMYLASE in the last 8760 hours. No results for input(s): AMMONIA in the last 8760 hours. CBC:  Recent Labs  02/04/14 0813 06/05/14 0820  WBC 7.1 4.9  NEUTROABS 3.7 2.2  HGB 13.5  --   HCT 38.2 38.5  MCV 88  --    Lipid Panel:  Recent Labs  02/04/14 0813 06/05/14 0820 11/17/14 0944  CHOL 177 178 139  HDL 44 38* 40  LDLCALC 92 97 70  TRIG 205* 213* 145  CHOLHDL 4.0 4.7* 3.5   Lab Results  Component Value Date   HGBA1C 6.2* 11/17/2014    Procedures since last visit: Reviewed old [place] of abdomen which showed no gallstones   Assessment/Plan 1. Abdominal pain, chronic, right upper quadrant - persisting for months now and worrisome to her - tried to get test several mos ago but it was denied by insurance and symptoms have not gotten better -have ruled out UTI, added amylase and lipase today to previously normal labs - ? Pancreatic or gall stones or biliary duct issue - CT Abdomen Pelvis W Wo Contrast; Future - Comprehensive metabolic panel; Future  2. Overactive bladder -ongoing -now w/o symptoms beside frequency since using premarin and obtaining a clean urine sample  3.  Controlled type 2 diabetes mellitus with diabetic nephropathy, without long-term current use of insulin ([place]) -has been under good control with oral agents including metformin; also on mevacor and losartan - CBC with Differential/Platelet; Future - Comprehensive metabolic panel; Future - Hemoglobin A1c; Future  4. Mixed hyperlipidemia due to type 2 diabetes mellitus ([place]) -improved with use of mevacor--loves sweets and trying to cut back also  5. CKD (chronic kidney disease) stage 3, GFR 30-59 ml/min - cont losartan and avoiding nsaids and nephrotoxic agents, hydrate - Comprehensive metabolic panel; Future  6. Postmenopausal atrophic vaginitis -cont premarin vaginal cream three times weekly for dryness  7. Need for prophylactic vaccination and inoculation against influenza - Flu Vaccine QUAD 36+ mos PF IM (Fluarix & Fluzone Quad PF) given  8. Need for 23-polyvalent pneumococcal polysaccharide vaccine - Pneumococcal polysaccharide vaccine 23-valent greater than or equal to 2yo subcutaneous/IM given  Labs/tests ordered:   Orders Placed This Encounter  Procedures  . CT Abdomen Pelvis W Wo Contrast    LABS DRAWN 11/20/14 IN EPIC/PT W/EPIC ORDER    Standing Status: Future     Number of Occurrences:      Standing Expiration Date: 02/20/2016    Order Specific Question:  Reason for exam:    Answer:  right upper quadrant abdominal pain intermittent for more than 6 months    Order Specific Question:  Preferred imaging location?    Answer:  [facility]  . Flu Vaccine QUAD 36+ mos PF IM (Fluarix & Fluzone Quad PF)  . Pneumococcal polysaccharide vaccine 23-valent greater than or equal to 2yo subcutaneous/IM  . CBC with Differential/Platelet    Standing Status: Future     Number of Occurrences:      Standing Expiration Date: 07/20/2015  .  Comprehensive metabolic panel    Standing Status: Future     Number of Occurrences:      Standing Expiration Date: 07/20/2015  . Hemoglobin A1c     Standing Status: Future     Number of Occurrences:      Standing Expiration Date: 07/20/2015   Next appt:  03/17/2015 with labs before   [place]. Hank Walling, D.O. [facility] [address], [place] [address] Cell Phone (Mon-Fri 8am-5pm):  [phone] On Call:  [phone] & follow prompts after 5pm & weekends Office Phone:  [phone] Office Fax:  [phone]

## 2014-11-21 LAB — LIPASE: Lipase: 26 U/L (ref 0–59)

## 2014-11-21 LAB — SPECIMEN STATUS REPORT

## 2014-11-21 LAB — AMYLASE: Amylase: 73 U/L (ref 31–124)

## 2014-11-24 NOTE — Addendum Note (Signed)
Addended by: Rafael Bihari A on: 11/24/2014 04:24 PM   Modules accepted: Orders

## 2014-12-03 ENCOUNTER — Ambulatory Visit
Admission: RE | Admit: 2014-12-03 | Discharge: 2014-12-03 | Disposition: A | Discharge status: Home / Self Care | Payer: Medicare Other | Source: Ambulatory Visit | Attending: Internal Medicine | Admitting: Internal Medicine

## 2014-12-03 DIAGNOSIS — G8929 Other chronic pain: Secondary | ICD-10-CM

## 2014-12-03 DIAGNOSIS — E1121 Type 2 diabetes mellitus with diabetic nephropathy: Secondary | ICD-10-CM

## 2014-12-03 DIAGNOSIS — N183 Chronic kidney disease, stage 3 unspecified: Secondary | ICD-10-CM

## 2014-12-03 DIAGNOSIS — R1011 Right upper quadrant pain: Principal | ICD-10-CM

## 2014-12-03 MED ORDER — IOPAMIDOL (ISOVUE-300) INJECTION 61%
100.0000 mL | Freq: Once | INTRAVENOUS | Status: AC | PRN
  Administered 2014-12-03: 100 mL via INTRAVENOUS

## 2014-12-23 LAB — HM MAMMOGRAPHY

## 2014-12-25 ENCOUNTER — Encounter: Payer: Self-pay | Admitting: *Deleted

## 2015-01-21 ENCOUNTER — Encounter: Payer: Self-pay | Admitting: Internal Medicine

## 2015-01-24 DIAGNOSIS — N952 Postmenopausal atrophic vaginitis: Secondary | ICD-10-CM | POA: Insufficient documentation

## 2015-01-29 ENCOUNTER — Other Ambulatory Visit: Payer: Self-pay | Admitting: Internal Medicine

## 2015-03-16 ENCOUNTER — Other Ambulatory Visit: Payer: Medicare Other

## 2015-03-16 DIAGNOSIS — E1121 Type 2 diabetes mellitus with diabetic nephropathy: Secondary | ICD-10-CM

## 2015-03-16 DIAGNOSIS — R1011 Right upper quadrant pain: Secondary | ICD-10-CM

## 2015-03-16 DIAGNOSIS — N183 Chronic kidney disease, stage 3 unspecified: Secondary | ICD-10-CM

## 2015-03-16 DIAGNOSIS — G8929 Other chronic pain: Secondary | ICD-10-CM

## 2015-03-17 ENCOUNTER — Other Ambulatory Visit: Payer: Medicare Other

## 2015-03-17 LAB — CBC WITH DIFFERENTIAL/PLATELET
Basophils Absolute: 0 10*3/uL (ref 0.0–0.2)
Basos: 0 %
EOS (ABSOLUTE): 0.1 10*3/uL (ref 0.0–0.4)
Eos: 2 %
Hematocrit: 40.7 % (ref 34.0–46.6)
Hemoglobin: 13.3 g/dL (ref 11.1–15.9)
Immature Grans (Abs): 0 10*3/uL (ref 0.0–0.1)
Immature Granulocytes: 0 %
Lymphocytes Absolute: 2.4 10*3/uL (ref 0.7–3.1)
Lymphs: 56 %
MCH: 29 pg (ref 26.6–33.0)
MCHC: 32.7 g/dL (ref 31.5–35.7)
MCV: 89 fL (ref 79–97)
Monocytes Absolute: 0.3 10*3/uL (ref 0.1–0.9)
Monocytes: 7 %
Neutrophils Absolute: 1.5 10*3/uL (ref 1.4–7.0)
Neutrophils: 35 %
Platelets: 307 10*3/uL (ref 150–379)
RBC: 4.59 x10E6/uL (ref 3.77–5.28)
RDW: 14.5 % (ref 12.3–15.4)
WBC: 4.4 10*3/uL (ref 3.4–10.8)

## 2015-03-17 LAB — COMPREHENSIVE METABOLIC PANEL
ALT: 8 IU/L (ref 0–32)
AST: 15 IU/L (ref 0–40)
Albumin/Globulin Ratio: 1.6 (ref 1.1–2.5)
Albumin: 4.5 g/dL (ref 3.5–4.8)
Alkaline Phosphatase: 69 IU/L (ref 39–117)
BUN/Creatinine Ratio: 15 (ref 11–26)
BUN: 14 mg/dL (ref 8–27)
Bilirubin Total: 0.3 mg/dL (ref 0.0–1.2)
CO2: 24 mmol/L (ref 18–29)
Calcium: 9.4 mg/dL (ref 8.7–10.3)
Chloride: 95 mmol/L — ABNORMAL LOW (ref 96–106)
Creatinine, Ser: 0.96 mg/dL (ref 0.57–1.00)
GFR calc Af Amer: 67 mL/min/{1.73_m2} (ref >59)
GFR calc non Af Amer: 58 mL/min/{1.73_m2} — ABNORMAL LOW (ref >59)
Globulin, Total: 2.9 g/dL (ref 1.5–4.5)
Glucose: 105 mg/dL — ABNORMAL HIGH (ref 65–99)
Potassium: 4 mmol/L (ref 3.5–5.2)
Sodium: 138 mmol/L (ref 134–144)
Total Protein: 7.4 g/dL (ref 6.0–8.5)

## 2015-03-17 LAB — HEMOGLOBIN A1C
Est. average glucose Bld gHb Est-mCnc: 137 mg/dL
Hgb A1c MFr Bld: 6.4 % — ABNORMAL HIGH (ref 4.8–5.6)

## 2015-03-19 ENCOUNTER — Encounter: Payer: Self-pay | Admitting: Internal Medicine

## 2015-03-19 ENCOUNTER — Ambulatory Visit (INDEPENDENT_AMBULATORY_CARE_PROVIDER_SITE_OTHER): Payer: Medicare Other | Admitting: Internal Medicine

## 2015-03-19 VITALS — BP 146/82 | HR 69 | Temp 99.0°F | Ht 64.5 in | Wt 139.0 lb

## 2015-03-19 DIAGNOSIS — Z0000 Encounter for general adult medical examination without abnormal findings: Secondary | ICD-10-CM

## 2015-03-19 DIAGNOSIS — I10 Essential (primary) hypertension: Secondary | ICD-10-CM | POA: Diagnosis not present

## 2015-03-19 DIAGNOSIS — E1169 Type 2 diabetes mellitus with other specified complication: Secondary | ICD-10-CM

## 2015-03-19 DIAGNOSIS — N183 Chronic kidney disease, stage 3 unspecified: Secondary | ICD-10-CM

## 2015-03-19 DIAGNOSIS — R109 Unspecified abdominal pain: Secondary | ICD-10-CM | POA: Diagnosis not present

## 2015-03-19 DIAGNOSIS — N3281 Overactive bladder: Secondary | ICD-10-CM

## 2015-03-19 DIAGNOSIS — E1121 Type 2 diabetes mellitus with diabetic nephropathy: Secondary | ICD-10-CM

## 2015-03-19 DIAGNOSIS — E782 Mixed hyperlipidemia: Secondary | ICD-10-CM

## 2015-03-19 MED ORDER — LOSARTAN POTASSIUM 50 MG PO TABS: 50.0000 mg | ORAL_TABLET | Freq: Every day | ORAL | Status: DC
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
## 2015-03-19 NOTE — Progress Notes (Signed)
Patient ID: Mandy Perry, female   DOB: [birth year], 75 y.o.   MRN: [id number] MMSE 29/30 passed clock drawing

## 2015-04-20 ENCOUNTER — Other Ambulatory Visit: Payer: Self-pay

## 2015-04-20 MED ORDER — LOSARTAN POTASSIUM 50 MG PO TABS: 50.0000 mg | ORAL_TABLET | Freq: Every day | ORAL | Status: DC

## 2015-04-20 MED ORDER — HYDROCHLOROTHIAZIDE 25 MG PO TABS: 25.0000 mg | ORAL_TABLET | Freq: Every day | ORAL | Status: DC

## 2015-04-20 MED ORDER — LOVASTATIN 10 MG PO TABS: ORAL_TABLET | ORAL | Status: DC

## 2015-04-21 ENCOUNTER — Other Ambulatory Visit: Payer: Self-pay | Admitting: *Deleted

## 2015-04-21 MED ORDER — METFORMIN HCL 1000 MG PO TABS: 1000.0000 mg | ORAL_TABLET | Freq: Two times a day (BID) | ORAL | Status: DC

## 2015-04-21 MED ORDER — HYDROCHLOROTHIAZIDE 25 MG PO TABS: 25.0000 mg | ORAL_TABLET | Freq: Every day | ORAL | Status: DC

## 2015-04-22 ENCOUNTER — Other Ambulatory Visit: Payer: Self-pay | Admitting: *Deleted

## 2015-04-22 MED ORDER — METFORMIN HCL 1000 MG PO TABS: 1000.0000 mg | ORAL_TABLET | Freq: Two times a day (BID) | ORAL | Status: DC

## 2015-06-03 ENCOUNTER — Other Ambulatory Visit: Payer: Self-pay

## 2015-06-03 MED ORDER — METOPROLOL TARTRATE 100 MG PO TABS: 50.0000 mg | ORAL_TABLET | Freq: Two times a day (BID) | ORAL | Status: DC

## 2015-06-24 ENCOUNTER — Encounter (HOSPITAL_COMMUNITY): Payer: Self-pay | Admitting: Emergency Medicine

## 2015-06-24 ENCOUNTER — Ambulatory Visit (HOSPITAL_COMMUNITY)
Admission: EM | Admit: 2015-06-24 | Discharge: 2015-06-24 | Disposition: A | Discharge status: Home / Self Care | Payer: Medicare Other | Attending: Emergency Medicine | Admitting: Emergency Medicine

## 2015-06-24 DIAGNOSIS — M542 Cervicalgia: Secondary | ICD-10-CM

## 2015-06-24 DIAGNOSIS — G44219 Episodic tension-type headache, not intractable: Secondary | ICD-10-CM | POA: Diagnosis not present

## 2015-06-24 DIAGNOSIS — M62838 Other muscle spasm: Secondary | ICD-10-CM

## 2015-06-24 DIAGNOSIS — M6248 Contracture of muscle, other site: Secondary | ICD-10-CM

## 2015-06-24 MED ORDER — DICLOFENAC SODIUM 1 % TD GEL: 2.0000 g | Freq: Four times a day (QID) | TRANSDERMAL | Status: DC

## 2015-06-24 NOTE — ED Provider Notes (Signed)
CSN: [id number]     Arrival date & time 06/24/15  1505 History   First MD Initiated Contact with Patient 06/24/15 1624     Chief Complaint  Patient presents with  . Torticollis   (Consider location/radiation/quality/duration/timing/severity/associated sxs/prior Treatment) HPI  Mandy Perry is a 75 y.o. female presenting to UC with c/o Left sided neck pain and stiffness after waking this morning.  Pain is aching and sore, causing a posterior headache. Pain is a "pulling" sensation, worse with certain movements such as looking up at the ceiling and rotating her head to the Right.  Pain is 6/10 at this time. She has tried acetaminophen w/o relief.  No known injuries.    Past Medical History  Diagnosis Date  . Diabetes mellitus without complication ([place])     123456 currently a goal  . Hyperlipidemia     Followed by PCP  . Essential hypertension, benign   . Solitary pulmonary nodule 2012  . Migraine, unspecified, without mention of intractable migraine without mention of status migrainosus   . Lumbago   . Benign neoplasm of colon   . GERD (gastroesophageal reflux disease)   . Osteoarthrosis, unspecified whether generalized or localized, unspecified site   . Palpitations     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  . Urinary frequency   . Hypertension    Past Surgical History  Procedure Laterality Date  . Abdominal hysterectomy    . Cardiac catheterization  11/15/1994    Patent coronary arteries and normal LV function  . Cardiac catheterization  11/03/2003    Normal coronary arteries  . Exercise stress test  11/12/1987    Positive  . Transthoracic echocardiogram  05/19/2010    EF >55%, normal-mild  . Colonoscopy  2010    Dr.Bucinni, Normal   . Colonoscopy  2004    Dr.Bucinni   Family History  Problem Relation Age of Onset  . CVA Mother   . Heart attack Father   . Cancer Father     Prostate cancer  . COPD Brother   . Diabetes Sister   . Colon cancer Sister   . Diabetes Sister   .  Cancer Brother    Social History  Substance Use Topics  . Smoking status: Former Smoker    Types: Cigarettes    Quit date: 01/10/1977  . Smokeless tobacco: Never Used     Comment: Quit at age 49  . Alcohol Use: No   OB History    No data available     Review of Systems  Constitutional: Negative for fever and chills.  Eyes: Negative for photophobia, pain and visual disturbance.  Musculoskeletal: Positive for myalgias, neck pain and neck stiffness. Negative for back pain and arthralgias.  Skin: Negative for color change and rash.  Neurological: Positive for headaches. Negative for dizziness, tremors, syncope, weakness, light-headedness and numbness.    Allergies  Azithromycin; Statins; Tramadol; and Gemfibrozil  Home Medications   Prior to Admission medications   Medication Sig Start Date End Date Taking? Authorizing Provider  acetaminophen (TYLENOL) 500 MG tablet Take 500 mg by mouth as needed for pain.    Historical Provider, MD  amLODipine (NORVASC) 10 MG tablet Take 1 tablet (10 mg total) by mouth daily. 11/20/14   Tiffany L Reed, DO  aspirin 325 MG tablet Take 1/2 tablet by mouth daily    Historical Provider, MD  conjugated estrogens (PREMARIN) vaginal cream Place 1 Applicatorful vaginally 3 (three) times a week. 11/20/14   Gayland Curry, DO  diclofenac sodium (VOLTAREN) 1 % GEL Apply 2 g topically 4 (four) times daily. To neck for pain 06/24/15   Noland Fordyce, PA-C  hydrochlorothiazide (HYDRODIURIL) 25 MG tablet Take 1 tablet (25 mg total) by mouth daily. 04/21/15   Tiffany L Reed, DO  lansoprazole (PREVACID) 30 MG capsule Take 30 mg by mouth daily at 12 noon.     Historical Provider, MD  losartan (COZAAR) 50 MG tablet Take 1 tablet (50 mg total) by mouth daily. 04/20/15   Tiffany L Reed, DO  lovastatin (MEVACOR) 10 MG tablet TAKE ONE TABLET BY MOUTH ONCE DAILY WITH  EVENING  MEAL  OR  AT  BEDTIME 04/20/15   Tiffany L Reed, DO  metFORMIN (GLUCOPHAGE) 1000 MG tablet Take 1  tablet (1,000 mg total) by mouth 2 (two) times daily with a meal. 04/22/15   Tiffany L Reed, DO  metoprolol (LOPRESSOR) 100 MG tablet Take 0.5 tablets (50 mg total) by mouth 2 (two) times daily. 06/03/15   Tiffany L Reed, DO  Multiple Vitamin (MULTIVITAMIN) tablet Take 1 tablet by mouth daily.     Historical Provider, MD  Omega-3 1000 MG CAPS Take 1 g by mouth.    Historical Provider, MD  tolterodine (DETROL LA) 4 MG 24 hr capsule Take 4 mg by mouth daily.    Historical Provider, MD   Meds Ordered and Administered this Visit  Medications - No data to display  BP 168/74 mmHg  Pulse 79  Temp(Src) 99.2 F (37.3 C) (Oral)  SpO2 95% No data found.   Physical Exam  Constitutional: She is oriented to person, place, and time. She appears well-developed and well-nourished.  HENT:  Head: Normocephalic and atraumatic.  Eyes: EOM are normal.  Neck: Normal range of motion. Neck supple.  No midline spinal tenderness. Tenderness just Left of cervical spinal over musculature.  Full ROM with increased pain on full extension and rotation to the Right.  Cardiovascular: Normal rate.   Pulmonary/Chest: Effort normal.  Musculoskeletal: Normal range of motion.  Full ROM upper extremities w/o pain.5/5 grip strength bilaterally.   Neurological: She is alert and oriented to person, place, and time.  Skin: Skin is warm and dry.  Psychiatric: She has a normal mood and affect. Her behavior is normal.  Nursing note and vitals reviewed.   ED Course  Procedures (including critical care time)  Labs Review Labs Reviewed - No data to display  Imaging Review No results found.    MDM   1. Neck pain on left side   2. Neck muscle spasm   3. Episodic tension-type headache, not intractable    Pt c/o Left side neck pain and stiffness since this morning. No known injuries. No red flag symptoms of numbness. No midline spinal tenderness. No evidence of meningitis.   Will treat for muscle spasm. Rx: voltaren  gel.  Encouraged alternating ice and heat, gentle massage and stretching.  F/u with PCP for recheck of symptoms if not improving in 1 week. Sooner if worsening.     Noland Fordyce, PA-C 06/24/15 2117

## 2015-06-24 NOTE — ED Notes (Signed)
PT reports she woke up with a stiff neck and a headache this AM. PT reports the back of her neck is tight and she feels as though a muscle is "pulling". PT reports her headaches have been off and on for the last few days.

## 2015-06-24 NOTE — Discharge Instructions (Signed)
Do not use more Voltaren gel than prescribed.  You may find comfort using a warm damp washcloth or healing pad to help with pain and muscle soreness. See below for further instructions.

## 2015-07-13 ENCOUNTER — Ambulatory Visit (INDEPENDENT_AMBULATORY_CARE_PROVIDER_SITE_OTHER): Payer: Medicare Other | Admitting: Nurse Practitioner

## 2015-07-13 ENCOUNTER — Encounter: Payer: Self-pay | Admitting: Nurse Practitioner

## 2015-07-13 VITALS — BP 122/74 | HR 73 | Temp 98.5°F | Resp 18 | Ht 64.0 in | Wt 140.4 lb

## 2015-07-13 DIAGNOSIS — J069 Acute upper respiratory infection, unspecified: Secondary | ICD-10-CM | POA: Diagnosis not present

## 2015-07-13 MED ORDER — AMOXICILLIN-POT CLAVULANATE 875-125 MG PO TABS: 1.0000 | ORAL_TABLET | Freq: Two times a day (BID) | ORAL | Status: DC

## 2015-07-13 NOTE — Progress Notes (Signed)
Patient ID: Mandy Perry, female   DOB: 10-24-1940, 75 y.o.   MRN: [id number]    PCP: Hollace Kinnier, DO  Advanced Directive information Does patient have an advance directive?: No, Would patient like information on creating an advanced directive?: No - patient declined information  Allergies  Allergen Reactions  . Azithromycin Hives  . Statins     Myalgias-high doses  . Tramadol Nausea Only  . Gemfibrozil Other (See Comments)    Leg cramps at night    Chief Complaint  Patient presents with  . Acute Visit    Cough x 3-4 days, nasal drainage, sneezing, headache. Back pain since Sunday     HPI: Patient is a 75 y.o. female seen in the office today due to feeling bad. Nose is stopped up and then running. Has frontal headache. No fever or chills. No sore throat. Also has been sneezing. She reports she has been having congestion for over a 1 week, reports deep cough started 3-4 days ago. Feels like there is thick congestion in her lungs but can not get anything up.  Has used robitussin and tylenol which has helped some.  No shortness of breath or chest pains. Feels tightness with congestion. Cough but not productive   Rode back from [employer] last week- when she got out of the car her back started hurting her a lot. Sharp pain when she got out of the car. Has gotten better since then. This is not unusual for her. Takes tylenol with good reliefs    Review of Systems:  Review of Systems  Constitutional: Positive for activity change and fatigue. Negative for fever and chills.  HENT: Positive for congestion, postnasal drip, rhinorrhea, sinus pressure, sneezing and voice change. Negative for ear pain, hearing loss, mouth sores and sore throat.   Respiratory: Positive for cough. Negative for shortness of breath.   Cardiovascular: Negative for chest pain and palpitations.  Neurological: Negative for dizziness and weakness.    Past Medical History  Diagnosis Date  . Diabetes mellitus  without complication ([place])     123456 currently a goal  . Hyperlipidemia     Followed by PCP  . Essential hypertension, benign   . Solitary pulmonary nodule 2012  . Migraine, unspecified, without mention of intractable migraine without mention of status migrainosus   . Lumbago   . Benign neoplasm of colon   . GERD (gastroesophageal reflux disease)   . Osteoarthrosis, unspecified whether generalized or localized, unspecified site   . Palpitations     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  . Urinary frequency   . Hypertension    Past Surgical History  Procedure Laterality Date  . Abdominal hysterectomy    . Cardiac catheterization  11/15/1994    Patent coronary arteries and normal LV function  . Cardiac catheterization  11/03/2003    Normal coronary arteries  . Exercise stress test  11/12/1987    Positive  . Transthoracic echocardiogram  05/19/2010    EF >55%, normal-mild  . Colonoscopy  2010    Dr.Bucinni, Normal   . Colonoscopy  2004    Dr.Bucinni   Social History:   reports that she quit smoking about 38 years ago. Her smoking use included Cigarettes. She has never used smokeless tobacco. She reports that she does not drink alcohol or use illicit drugs.  Family History  Problem Relation Age of Onset  . CVA Mother   . Heart attack Father   . Cancer Father     Prostate  cancer  . COPD Brother   . Diabetes Sister   . Colon cancer Sister   . Diabetes Sister   . Cancer Brother     Medications: Patient's Medications  New Prescriptions   No medications on file  Previous Medications   ACETAMINOPHEN (TYLENOL) 500 MG TABLET    Take 500 mg by mouth as needed for pain.   AMLODIPINE (NORVASC) 10 MG TABLET    Take 1 tablet (10 mg total) by mouth daily.   ASPIRIN 325 MG TABLET    Take 1/2 tablet by mouth daily   CONJUGATED ESTROGENS (PREMARIN) VAGINAL CREAM    Place 1 Applicatorful vaginally 3 (three) times a week.   DICLOFENAC SODIUM (VOLTAREN) 1 % GEL    Apply 2 g topically 4 (four)  times daily. To neck for pain   HYDROCHLOROTHIAZIDE (HYDRODIURIL) 25 MG TABLET    Take 1 tablet (25 mg total) by mouth daily.   LANSOPRAZOLE (PREVACID) 30 MG CAPSULE    Take 30 mg by mouth daily at 12 noon.    LOSARTAN (COZAAR) 50 MG TABLET    Take 1 tablet (50 mg total) by mouth daily.   LOVASTATIN (MEVACOR) 10 MG TABLET    TAKE ONE TABLET BY MOUTH ONCE DAILY WITH  EVENING  MEAL  OR  AT  BEDTIME   METFORMIN (GLUCOPHAGE) 1000 MG TABLET    Take 1 tablet (1,000 mg total) by mouth 2 (two) times daily with a meal.   METOPROLOL (LOPRESSOR) 100 MG TABLET    Take 0.5 tablets (50 mg total) by mouth 2 (two) times daily.   MULTIPLE VITAMIN (MULTIVITAMIN) TABLET    Take 1 tablet by mouth daily.    OMEGA-3 1000 MG CAPS    Take 1 g by mouth.   TOLTERODINE (DETROL LA) 4 MG 24 HR CAPSULE    Take 4 mg by mouth daily.  Modified Medications   No medications on file  Discontinued Medications   ACETAMINOPHEN (TYLENOL) 500 MG TABLET    Take 500 mg by mouth.     Physical Exam:  Filed Vitals:   07/13/15 1306  BP: 122/74  Pulse: 73  Temp: 98.5 F (36.9 C)  TempSrc: Oral  Resp: 18  Height: 5\' 4"  (1.626 m)  Weight: 140 lb 6.4 oz (63.685 kg)  SpO2: 95%   Body mass index is 24.09 kg/(m^2).  Physical Exam  Constitutional: She is oriented to person, place, and time. She appears well-developed and well-nourished. No distress.  HENT:  Head: Normocephalic and atraumatic.  Right Ear: External ear normal.  Left Ear: External ear normal.  Nose: Nose normal.  Mouth/Throat: Oropharynx is clear and moist. No oropharyngeal exudate.  Eyes: Conjunctivae and EOM are normal. Pupils are equal, round, and reactive to light.  Neck: Normal range of motion. Neck supple.  Cardiovascular: Normal rate, regular rhythm and normal heart sounds.   Pulmonary/Chest: Effort normal and breath sounds normal. No respiratory distress.  Neurological: She is alert and oriented to person, place, and time. She has normal reflexes.    Skin: Skin is warm and dry.  Psychiatric: She has a normal mood and affect. Her behavior is normal. Judgment and thought content normal.    Labs reviewed: Basic Metabolic Panel:  Recent Labs  11/17/14 0944 03/16/15 1041  NA 137 138  K 4.5 4.0  CL 96* 95*  CO2 25 24  GLUCOSE 101* 105*  BUN 15 14  CREATININE 1.03* 0.96  CALCIUM 9.5 9.4   Liver Function Tests:  Recent Labs  03/16/15 1041  AST 15  ALT 8  ALKPHOS 69  BILITOT 0.3  PROT 7.4  ALBUMIN 4.5    Recent Labs  11/17/14 0944  LIPASE 26  AMYLASE 73   No results for input(s): AMMONIA in the last 8760 hours. CBC:  Recent Labs  03/16/15 1041  WBC 4.4  NEUTROABS 1.5  HCT 40.7  MCV 89  PLT 307   Lipid Panel:  Recent Labs  11/17/14 0944  CHOL 139  HDL 40  LDLCALC 70  TRIG 145  CHOLHDL 3.5   TSH: No results for input(s): TSH in the last 8760 hours. A1C: Lab Results  Component Value Date   HGBA1C 6.4* 03/16/2015     Assessment/Plan 1. URI (upper respiratory infection) -overall feels worse, will treat with augmentin -may use nasal saline as needed -mucinex DM 1-2 tablets PO BID for 7 days with full glass of water -increase hydration  - amoxicillin-clavulanate (AUGMENTIN) 875-125 MG tablet; Take 1 tablet by mouth 2 (two) times daily.  Dispense: 14 tablet; Refill: 0 -follow up instruction given. Pt understands   Carlos American. Harle Battiest  [facility] [phone] 8 am - 5 pm) [phone] (after hours)

## 2015-07-13 NOTE — Patient Instructions (Signed)
Increase water intake mucinex DM 1-2 tablets twice daily for 1 week- take with full glass of water Will start you on Augmentin - take twice daily for 1 week  Upper Respiratory Infection, Adult Most upper respiratory infections (URIs) are a viral infection of the air passages leading to the lungs. A URI affects the nose, throat, and upper air passages. The most common type of URI is nasopharyngitis and is typically referred to as "the common cold." URIs run their course and usually go away on their own. Most of the time, a URI does not require medical attention, but sometimes a bacterial infection in the upper airways can follow a viral infection. This is called a secondary infection. Sinus and middle ear infections are common types of secondary upper respiratory infections. Bacterial pneumonia can also complicate a URI. A URI can worsen asthma and chronic obstructive pulmonary disease (COPD). Sometimes, these complications can require emergency medical care and may be life threatening.  CAUSES Almost all URIs are caused by viruses. A virus is a type of germ and can spread from one person to another.  RISKS FACTORS You may be at risk for a URI if:   You smoke.   You have chronic heart or lung disease.  You have a weakened defense (immune) system.   You are very young or very old.   You have nasal allergies or asthma.  You work in crowded or poorly ventilated areas.  You work in health care facilities or schools. SIGNS AND SYMPTOMS  Symptoms typically develop 2-3 days after you come in contact with a cold virus. Most viral URIs last 7-10 days. However, viral URIs from the influenza virus (flu virus) can last 14-18 days and are typically more severe. Symptoms may include:   Runny or stuffy (congested) nose.   Sneezing.   Cough.   Sore throat.   Headache.   Fatigue.   Fever.   Loss of appetite.   Pain in your forehead, behind your eyes, and over your cheekbones  (sinus pain).  Muscle aches.  DIAGNOSIS  Your health care provider may diagnose a URI by:  Physical exam.  Tests to check that your symptoms are not due to another condition such as:  Strep throat.  Sinusitis.  Pneumonia.  Asthma. TREATMENT  A URI goes away on its own with time. It cannot be cured with medicines, but medicines may be prescribed or recommended to relieve symptoms. Medicines may help:  Reduce your fever.  Reduce your cough.  Relieve nasal congestion. HOME CARE INSTRUCTIONS   Take medicines only as directed by your health care provider.   Gargle warm saltwater or take cough drops to comfort your throat as directed by your health care provider.  Use a warm mist humidifier or inhale steam from a shower to increase air moisture. This may make it easier to breathe.  Drink enough fluid to keep your urine clear or pale yellow.   Eat soups and other clear broths and maintain good nutrition.   Rest as needed.   Return to work when your temperature has returned to normal or as your health care provider advises. You may need to stay home longer to avoid infecting others. You can also use a face mask and careful hand washing to prevent spread of the virus.  Increase the usage of your inhaler if you have asthma.   Do not use any tobacco products, including cigarettes, chewing tobacco, or electronic cigarettes. If you need help quitting, ask your  health care provider. PREVENTION  The best way to protect yourself from getting a cold is to practice good hygiene.   Avoid oral or hand contact with people with cold symptoms.   Wash your hands often if contact occurs.  There is no clear evidence that vitamin C, vitamin E, echinacea, or exercise reduces the chance of developing a cold. However, it is always recommended to get plenty of rest, exercise, and practice good nutrition.  SEEK MEDICAL CARE IF:   You are getting worse rather than better.   Your  symptoms are not controlled by medicine.   You have chills.  You have worsening shortness of breath.  You have brown or red mucus.  You have yellow or brown nasal discharge.  You have pain in your face, especially when you bend forward.  You have a fever.  You have swollen neck glands.  You have pain while swallowing.  You have white areas in the back of your throat. SEEK IMMEDIATE MEDICAL CARE IF:   You have severe or persistent:  Headache.  Ear pain.  Sinus pain.  Chest pain.  You have chronic lung disease and any of the following:  Wheezing.  Prolonged cough.  Coughing up blood.  A change in your usual mucus.  You have a stiff neck.  You have changes in your:  Vision.  Hearing.  Thinking.  Mood. MAKE SURE YOU:   Understand these instructions.  Will watch your condition.  Will get help right away if you are not doing well or get worse.   This information is not intended to replace advice given to you by your health care provider. Make sure you discuss any questions you have with your health care provider.   Document Released: 06/22/2000 Document Revised: 05/13/2014 Document Reviewed: 04/03/2013 Elsevier Interactive Patient Education [employer].

## 2015-08-04 NOTE — Addendum Note (Signed)
Addended by: Moshe Cipro, MESHELL A on: 08/04/2015 03:50 PM   Modules accepted: Orders

## 2015-08-10 ENCOUNTER — Other Ambulatory Visit: Payer: Medicare Other

## 2015-08-10 DIAGNOSIS — I10 Essential (primary) hypertension: Secondary | ICD-10-CM

## 2015-08-10 DIAGNOSIS — E1121 Type 2 diabetes mellitus with diabetic nephropathy: Secondary | ICD-10-CM

## 2015-08-10 DIAGNOSIS — E1169 Type 2 diabetes mellitus with other specified complication: Secondary | ICD-10-CM

## 2015-08-10 DIAGNOSIS — E782 Mixed hyperlipidemia: Secondary | ICD-10-CM

## 2015-08-10 LAB — BASIC METABOLIC PANEL
BUN: 20 mg/dL (ref 7–25)
CO2: 25 mmol/L (ref 20–31)
Calcium: 9.6 mg/dL (ref 8.6–10.4)
Chloride: 100 mmol/L (ref 98–110)
Creat: 1.01 mg/dL — ABNORMAL HIGH (ref 0.60–0.93)
Glucose, Bld: 110 mg/dL — ABNORMAL HIGH (ref 65–99)
Potassium: 4.4 mmol/L (ref 3.5–5.3)
Sodium: 136 mmol/L (ref 135–146)

## 2015-08-10 LAB — HEMOGLOBIN A1C
Hgb A1c MFr Bld: 6.4 % — ABNORMAL HIGH (ref <5.7)
Mean Plasma Glucose: 137 mg/dL

## 2015-08-10 LAB — LIPID PANEL
Cholesterol: 129 mg/dL (ref 125–200)
HDL: 42 mg/dL — ABNORMAL LOW (ref >=46)
LDL Cholesterol: 54 mg/dL (ref <130)
Total CHOL/HDL Ratio: 3.1 Ratio (ref <=5.0)
Triglycerides: 167 mg/dL — ABNORMAL HIGH (ref <150)
VLDL: 33 mg/dL — ABNORMAL HIGH (ref <30)

## 2015-08-13 ENCOUNTER — Encounter: Payer: Self-pay | Admitting: Internal Medicine

## 2015-08-13 ENCOUNTER — Ambulatory Visit (INDEPENDENT_AMBULATORY_CARE_PROVIDER_SITE_OTHER): Payer: Medicare Other | Admitting: Internal Medicine

## 2015-08-13 VITALS — BP 110/70 | HR 78 | Temp 98.6°F | Wt 135.0 lb

## 2015-08-13 DIAGNOSIS — E782 Mixed hyperlipidemia: Secondary | ICD-10-CM | POA: Diagnosis not present

## 2015-08-13 DIAGNOSIS — I10 Essential (primary) hypertension: Secondary | ICD-10-CM

## 2015-08-13 DIAGNOSIS — N183 Chronic kidney disease, stage 3 unspecified: Secondary | ICD-10-CM

## 2015-08-13 DIAGNOSIS — M899 Disorder of bone, unspecified: Secondary | ICD-10-CM

## 2015-08-13 DIAGNOSIS — E1122 Type 2 diabetes mellitus with diabetic chronic kidney disease: Secondary | ICD-10-CM | POA: Diagnosis not present

## 2015-08-13 DIAGNOSIS — N952 Postmenopausal atrophic vaginitis: Secondary | ICD-10-CM

## 2015-08-13 DIAGNOSIS — R634 Abnormal weight loss: Secondary | ICD-10-CM | POA: Diagnosis not present

## 2015-08-13 DIAGNOSIS — M8580 Other specified disorders of bone density and structure, unspecified site: Secondary | ICD-10-CM

## 2015-08-13 MED ORDER — VITAMIN D3 50 MCG (2000 UT) PO CHEW: 1.0000 | CHEWABLE_TABLET | Freq: Every day | ORAL | 3 refills | Status: DC

## 2015-08-13 NOTE — Progress Notes (Signed)
Location:  [facility] clinic Provider:  Lenya Sterne L. [name], D.O., C.M.D.  Goals of Care:  Advanced Directives 08/13/2015  Does patient have an advance directive? No  Would patient like information on creating an advanced directive? -  Pre-existing out of facility DNR order (yellow form or pink MOST form) -   Chief Complaint  Patient presents with  . Medical Management of Chronic Issues    72mth follow-up    HPI: Patient is a 75 y.o. female seen today for medical management of chronic diseases.    She has lost weight--5 lbs since the beginning of July. Gets up and eats breakfast, then lunch.  Has not been eating an evening meal.  Drinking glucerna only.  Doesn't feel well if she goes to be soon after a meal.     C/o dry mouth in the afternoon.  Admits she skips her water pill sometimes.  Claims the detrol started afterwards.  Is using biotene spray.   Sugar average is stable at 6.4.    Still having urgency to go, but does not come out right away.  Is on detrol and unsure if it is helping.  No incontinence though.  Past Medical History:  Diagnosis Date  . Benign neoplasm of colon   . Diabetes mellitus without complication ([facility])    123456 currently a goal  . Essential hypertension, benign   . GERD (gastroesophageal reflux disease)   . Hyperlipidemia    Followed by PCP  . Hypertension   . Lumbago   . Migraine, unspecified, without mention of intractable migraine without mention of status migrainosus   . Osteoarthrosis, unspecified whether generalized or localized, unspecified site   . Palpitations    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  . Solitary pulmonary nodule 2012  . Urinary frequency     Past Surgical History:  Procedure Laterality Date  . ABDOMINAL HYSTERECTOMY    . CARDIAC CATHETERIZATION  11/15/1994   Patent coronary arteries and normal LV function  . CARDIAC CATHETERIZATION  11/03/2003   Normal coronary arteries  . COLONOSCOPY  2010   Dr.Bucinni, Normal   . COLONOSCOPY  2004     Dr.Bucinni  . EXERCISE STRESS TEST  11/12/1987   Positive  . TRANSTHORACIC ECHOCARDIOGRAM  05/19/2010   EF >55%, normal-mild    Allergies  Allergen Reactions  . Azithromycin Hives  . Statins     Myalgias-high doses  . Tramadol Nausea Only  . Gemfibrozil Other (See Comments)    Leg cramps at night      Medication List       Accurate as of 08/13/15  8:28 AM. Always use your most recent med list.          acetaminophen 500 MG tablet Commonly known as:  TYLENOL Take 500 mg by mouth as needed for pain.   amLODipine 10 MG tablet Commonly known as:  NORVASC Take 1 tablet (10 mg total) by mouth daily.   aspirin 325 MG tablet Take 1/2 tablet by mouth daily   conjugated estrogens vaginal cream Commonly known as:  PREMARIN Place 1 Applicatorful vaginally 3 (three) times a week.   hydrochlorothiazide 25 MG tablet Commonly known as:  HYDRODIURIL Take 1 tablet (25 mg total) by mouth daily.   lansoprazole 30 MG capsule Commonly known as:  PREVACID Take 30 mg by mouth daily at 12 noon.   losartan 50 MG tablet Commonly known as:  COZAAR Take 1 tablet (50 mg total) by mouth daily.   lovastatin 10 MG tablet  Commonly known as:  MEVACOR TAKE ONE TABLET BY MOUTH ONCE DAILY WITH  EVENING  MEAL  OR  AT  BEDTIME   metFORMIN 1000 MG tablet Commonly known as:  GLUCOPHAGE Take 1 tablet (1,000 mg total) by mouth 2 (two) times daily with a meal.   metoprolol 100 MG tablet Commonly known as:  LOPRESSOR Take 0.5 tablets (50 mg total) by mouth 2 (two) times daily.   multivitamin tablet Take 1 tablet by mouth daily.   Omega-3 1000 MG Caps Take 1 g by mouth.   tolterodine 4 MG 24 hr capsule Commonly known as:  DETROL LA Take 4 mg by mouth daily.       Review of Systems:  Review of Systems  Constitutional: Positive for weight loss. Negative for chills and fever.       But review of her weights, she ranged from 136-145 over the past 3 years  HENT: Positive for hearing loss.         Dry mouth  Eyes: Negative for blurred vision.       Glasses  Respiratory: Negative for cough and shortness of breath.   Cardiovascular: Negative for chest pain, palpitations and leg swelling.       Reports occasional skipped beat  Gastrointestinal: Negative for abdominal pain, blood in stool, constipation, diarrhea and melena.  Genitourinary: Negative for dysuria, frequency and urgency.  Musculoskeletal: Negative for falls.  Skin: Negative for itching and rash.  Neurological: Negative for dizziness, loss of consciousness, weakness and headaches.  Psychiatric/Behavioral: Positive for depression. Negative for memory loss. The patient is nervous/anxious.        Still misses her husband    Health Maintenance  Topic Date Due  . INFLUENZA VACCINE  08/11/2015  . ZOSTAVAX  08/09/2016 (Originally 01/10/2001)  . FOOT EXAM  11/20/2015  . HEMOGLOBIN A1C  02/10/2016  . OPHTHALMOLOGY EXAM  06/28/2016  . MAMMOGRAM  12/22/2016  . COLONOSCOPY  08/15/2023  . TETANUS/TDAP  09/02/2024  . DEXA SCAN  Completed  . PNA vac Low Risk Adult  Completed    Physical Exam: Vitals:   08/13/15 0757  BP: 110/70  Pulse: 78  Temp: 98.6 F (37 C)  TempSrc: Oral  SpO2: 98%  Weight: 135 lb (61.2 kg)   Body mass index is 23.17 kg/m. Physical Exam  Constitutional: She is oriented to person, place, and time. She appears well-developed and well-nourished. No distress.  Cardiovascular: Normal rate, regular rhythm, normal heart sounds and intact distal pulses.   Pulmonary/Chest: Effort normal and breath sounds normal. No respiratory distress.  Abdominal: Soft. Bowel sounds are normal. She exhibits no distension. There is no tenderness. There is no guarding.  Musculoskeletal: Normal range of motion.  Walks w/o assistive device  Neurological: She is alert and oriented to person, place, and time.  Skin: Skin is warm and dry.  Psychiatric:  Flat affect, then anxious when talking    Labs reviewed: Basic  Metabolic Panel:  Recent Labs  11/17/14 0944 03/16/15 1041 08/10/15 1001  NA 137 138 136  K 4.5 4.0 4.4  CL 96* 95* 100  CO2 25 24 25   GLUCOSE 101* 105* 110*  BUN 15 14 20   CREATININE 1.03* 0.96 1.01*  CALCIUM 9.5 9.4 9.6   Liver Function Tests:  Recent Labs  03/16/15 1041  AST 15  ALT 8  ALKPHOS 69  BILITOT 0.3  PROT 7.4  ALBUMIN 4.5    Recent Labs  11/17/14 0944  LIPASE 26  AMYLASE 73  No results for input(s): AMMONIA in the last 8760 hours. CBC:  Recent Labs  03/16/15 1041  WBC 4.4  NEUTROABS 1.5  HCT 40.7  MCV 89  PLT 307   Lipid Panel:  Recent Labs  11/17/14 0944 08/10/15 1001  CHOL 139 129  HDL 40 42*  LDLCALC 70 54  TRIG 145 167*  CHOLHDL 3.5 3.1   Lab Results  Component Value Date   HGBA1C 6.4 (H) 08/10/2015   Assessment/Plan 1. Senile osteopenia - previously advised to take vitamin D, but is not so will order for her today--to take 2000 units daily - DG Bone Density; Future - Basic metabolic panel; Future  2. Essential hypertension - bp low today and hasn't had medication, but reports bps in 140s at home -does not make sense especially with the amount of medication she is taking at the moment -I wanted to stop hctz due to dry mouth and urinary incontinence, but we compromised with every other day now - Basic metabolic panel; Future  3. Controlled type 2 diabetes mellitus with stage 3 chronic kidney disease, without long-term current use of insulin ([place]) - well controlled, but needs to eat frequent small meals - Hemoglobin A1c; Future  4. Hypercholesterolemia with hypertriglyceridemia -triglycerides trended up again, but LDL at goal; cont mevacor  5. CKD (chronic kidney disease) stage 3, GFR 30-59 ml/min - has been stable, cont hydration, avoid nephrotoxic meds, cont losartan for protection - Basic metabolic panel; Future  6. Atrophic vaginitis -cont estrogen cream to help with irritation  7. Loss of weight -weight has  fluctuated up to 145 max in past 3 years, but has dropped 5 lbs in one month -admits to some persistent grieving, but does not want meds (wants me to stop meds) -cont to monitor and get back to frequent small meals  Labs/tests ordered:   Orders Placed This Encounter  Procedures  . DG Bone Density    Standing Status:   Future    Standing Expiration Date:   10/12/2016    Order Specific Question:   Reason for Exam (SYMPTOM  OR DIAGNOSIS REQUIRED)    Answer:   osteopenia, has not been taking vitamin D supplement only MVI    Order Specific Question:   Preferred imaging location?    Answer:   External    Comments:   [facility]  . Basic metabolic panel    Standing Status:   Future    Standing Expiration Date:   04/12/2016    Order Specific Question:   Has the patient fasted?    Answer:   Yes  . Hemoglobin A1c    Standing Status:   Future    Standing Expiration Date:   04/12/2016    Next appt:  12/31/2015 med mgt with labs before   Lashanda Storlie L. Izzak Fries, D.O. [facility] [address], [place] [address] Cell Phone (Mon-Fri 8am-5pm):  [phone] On Call:  [phone] & follow prompts after 5pm & weekends Office Phone:  [phone] Office Fax:  [phone]

## 2015-08-13 NOTE — Patient Instructions (Signed)
Let me know if your blood pressure is over 150/90 with taking your hydrochlorothiazide every other day.

## 2015-08-27 LAB — HM DEXA SCAN

## 2015-08-31 ENCOUNTER — Encounter: Payer: Self-pay | Admitting: *Deleted

## 2015-09-02 ENCOUNTER — Other Ambulatory Visit: Payer: Self-pay

## 2015-09-02 MED ORDER — METOPROLOL TARTRATE 100 MG PO TABS: 50.0000 mg | ORAL_TABLET | Freq: Two times a day (BID) | ORAL | 11 refills | Status: DC

## 2015-10-16 ENCOUNTER — Other Ambulatory Visit: Payer: Self-pay | Admitting: *Deleted

## 2015-10-16 MED ORDER — METFORMIN HCL 1000 MG PO TABS: 1000.0000 mg | ORAL_TABLET | Freq: Two times a day (BID) | ORAL | 1 refills | Status: DC

## 2015-10-16 NOTE — Telephone Encounter (Signed)
[place] [phone]

## 2015-11-03 ENCOUNTER — Other Ambulatory Visit: Payer: Self-pay | Admitting: *Deleted

## 2015-11-03 MED ORDER — LOVASTATIN 10 MG PO TABS: ORAL_TABLET | ORAL | 0 refills | Status: DC

## 2015-11-03 NOTE — Telephone Encounter (Signed)
[facility]

## 2015-11-09 ENCOUNTER — Other Ambulatory Visit: Payer: Self-pay | Admitting: *Deleted

## 2015-11-09 MED ORDER — LOSARTAN POTASSIUM 50 MG PO TABS: 50.0000 mg | ORAL_TABLET | Freq: Every day | ORAL | 1 refills | Status: DC

## 2015-11-09 NOTE — Telephone Encounter (Signed)
[facility] 

## 2015-12-14 ENCOUNTER — Telehealth: Payer: Self-pay | Admitting: Internal Medicine

## 2015-12-14 NOTE — Telephone Encounter (Signed)
left msg asking pt to confirm this AWV w/ nurse and CPE w/ Dr. Reed. VDM (DD) °

## 2015-12-16 ENCOUNTER — Other Ambulatory Visit: Payer: Self-pay | Admitting: *Deleted

## 2015-12-16 MED ORDER — AMLODIPINE BESYLATE 10 MG PO TABS: 10.0000 mg | ORAL_TABLET | Freq: Every day | ORAL | 1 refills | Status: DC

## 2015-12-16 NOTE — Telephone Encounter (Signed)
[facility] 

## 2015-12-28 ENCOUNTER — Other Ambulatory Visit: Payer: Medicare Other

## 2015-12-28 DIAGNOSIS — N183 Chronic kidney disease, stage 3 unspecified: Secondary | ICD-10-CM

## 2015-12-28 DIAGNOSIS — I10 Essential (primary) hypertension: Secondary | ICD-10-CM

## 2015-12-28 DIAGNOSIS — M8580 Other specified disorders of bone density and structure, unspecified site: Secondary | ICD-10-CM

## 2015-12-28 DIAGNOSIS — E1122 Type 2 diabetes mellitus with diabetic chronic kidney disease: Secondary | ICD-10-CM

## 2015-12-28 LAB — BASIC METABOLIC PANEL
BUN: 15 mg/dL (ref 7–25)
CO2: 27 mmol/L (ref 20–31)
Calcium: 9.5 mg/dL (ref 8.6–10.4)
Chloride: 104 mmol/L (ref 98–110)
Creat: 1 mg/dL — ABNORMAL HIGH (ref 0.60–0.93)
Glucose, Bld: 103 mg/dL — ABNORMAL HIGH (ref 65–99)
Potassium: 4.4 mmol/L (ref 3.5–5.3)
Sodium: 140 mmol/L (ref 135–146)

## 2015-12-29 LAB — HEMOGLOBIN A1C
Hgb A1c MFr Bld: 5.9 % — ABNORMAL HIGH (ref <5.7)
Mean Plasma Glucose: 123 mg/dL

## 2015-12-31 ENCOUNTER — Ambulatory Visit (INDEPENDENT_AMBULATORY_CARE_PROVIDER_SITE_OTHER): Payer: Medicare Other | Admitting: Internal Medicine

## 2015-12-31 ENCOUNTER — Encounter: Payer: Self-pay | Admitting: Internal Medicine

## 2015-12-31 VITALS — BP 118/62 | HR 66 | Temp 98.4°F | Wt 134.0 lb

## 2015-12-31 DIAGNOSIS — N183 Chronic kidney disease, stage 3 unspecified: Secondary | ICD-10-CM

## 2015-12-31 DIAGNOSIS — N3281 Overactive bladder: Secondary | ICD-10-CM | POA: Diagnosis not present

## 2015-12-31 DIAGNOSIS — E1122 Type 2 diabetes mellitus with diabetic chronic kidney disease: Secondary | ICD-10-CM

## 2015-12-31 DIAGNOSIS — I10 Essential (primary) hypertension: Secondary | ICD-10-CM | POA: Diagnosis not present

## 2015-12-31 DIAGNOSIS — N952 Postmenopausal atrophic vaginitis: Secondary | ICD-10-CM

## 2015-12-31 DIAGNOSIS — E782 Mixed hyperlipidemia: Secondary | ICD-10-CM | POA: Diagnosis not present

## 2015-12-31 NOTE — Patient Instructions (Signed)
Stop hctz.   Please monitor your blood pressure since we are stopping the water pill.  If you notice blood pressure coming up over 140/90 regularly, call me back so we can adjust one of your other blood pressure pills.

## 2015-12-31 NOTE — Progress Notes (Signed)
Location:  [facility] clinic Provider:  Mirabella Hilario L. [name], D.O., C.M.D.  Code Status: full code Goals of Care:  Advanced Directives 12/31/2015  Does Patient Have a Medical Advance Directive? No  Would patient like information on creating a medical advance directive? No - Patient declined  Pre-existing out of facility DNR order (yellow form or pink MOST form) -   Chief Complaint  Patient presents with  . Medical Management of Chronic Issues    32mth follow-up    HPI: Patient is a 75 y.o. female seen today for medical management of chronic diseases.    Sugar average has improved to 5.9  from 6.4. Kidney function stable. Needs foot exam today.  Says she is doing well.   Says she continuously feels she has to urinate and will have to sit and sit to actually go.  Was treated for another UTI with abx for a week in October.  She has a new sexual partner.  Her spirits are much better.  She has a dry mouth from the detrol and hctz.  Requests a change.     Past Medical History:  Diagnosis Date  . Benign neoplasm of colon   . Diabetes mellitus without complication (HCC)    123456 currently a goal  . Essential hypertension, benign   . GERD (gastroesophageal reflux disease)   . Hyperlipidemia    Followed by PCP  . Hypertension   . Lumbago   . Migraine, unspecified, without mention of intractable migraine without mention of status migrainosus   . Osteoarthrosis, unspecified whether generalized or localized, unspecified site   . Palpitations    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  . Solitary pulmonary nodule 2012  . Urinary frequency     Past Surgical History:  Procedure Laterality Date  . ABDOMINAL HYSTERECTOMY    . CARDIAC CATHETERIZATION  11/15/1994   Patent coronary arteries and normal LV function  . CARDIAC CATHETERIZATION  11/03/2003   Normal coronary arteries  . COLONOSCOPY  2010   Dr.Bucinni, Normal   . COLONOSCOPY  2004   Dr.Bucinni  . EXERCISE STRESS TEST  11/12/1987   Positive    . TRANSTHORACIC ECHOCARDIOGRAM  05/19/2010   EF >55%, normal-mild    Allergies  Allergen Reactions  . Azithromycin Hives  . Statins     Myalgias-high doses  . Tramadol Nausea Only  . Gemfibrozil Other (See Comments)    Leg cramps at night    Allergies as of 12/31/2015      Reactions   Azithromycin Hives   Statins    Myalgias-high doses   Tramadol Nausea Only   Gemfibrozil Other (See Comments)   Leg cramps at night      Medication List       Accurate as of 12/31/15  8:50 AM. Always use your most recent med list.          acetaminophen 500 MG tablet Commonly known as:  TYLENOL Take 500 mg by mouth as needed for pain.   amLODipine 10 MG tablet Commonly known as:  NORVASC Take 1 tablet (10 mg total) by mouth daily.   aspirin 325 MG tablet Take 1/2 tablet by mouth daily   conjugated estrogens vaginal cream Commonly known as:  PREMARIN Place vaginally daily.   hydrochlorothiazide 25 MG tablet Commonly known as:  HYDRODIURIL Take 1 tablet (25 mg total) by mouth daily.   lansoprazole 30 MG capsule Commonly known as:  PREVACID Take 30 mg by mouth daily at 12 noon.   losartan  50 MG tablet Commonly known as:  COZAAR Take 1 tablet (50 mg total) by mouth daily.   lovastatin 10 MG tablet Commonly known as:  MEVACOR Take one tablet by mouth daily with evening meal or at bedtime   metFORMIN 1000 MG tablet Commonly known as:  GLUCOPHAGE Take 1 tablet (1,000 mg total) by mouth 2 (two) times daily with a meal.   metoprolol 100 MG tablet Commonly known as:  LOPRESSOR Take 0.5 tablets (50 mg total) by mouth 2 (two) times daily.   multivitamin tablet Take 1 tablet by mouth daily.   Omega-3 1000 MG Caps Take 1 g by mouth.   tolterodine 4 MG 24 hr capsule Commonly known as:  DETROL LA Take 4 mg by mouth daily.   Vitamin D3 2000 units Chew Chew 1 capsule by mouth daily.       Review of Systems:  Review of Systems  Constitutional: Positive for weight  loss. Negative for chills and fever.       Down one lb  HENT: Negative for congestion.   Eyes: Negative for blurred vision.       Glasses  Respiratory: Negative for cough and shortness of breath.   Cardiovascular: Negative for chest pain, palpitations and leg swelling.  Gastrointestinal: Negative for abdominal pain, blood in stool, constipation and melena.  Genitourinary: Positive for frequency and urgency. Negative for dysuria, flank pain and hematuria.  Musculoskeletal: Negative for back pain, falls, joint pain, myalgias and neck pain.  Skin: Negative for itching and rash.  Neurological: Negative for dizziness and loss of consciousness.  Endo/Heme/Allergies: Does not bruise/bleed easily.  Psychiatric/Behavioral: Negative for depression and memory loss. The patient does not have insomnia.     Health Maintenance  Topic Date Due  . FOOT EXAM  11/20/2015  . ZOSTAVAX  08/09/2016 (Originally 01/10/2001)  . HEMOGLOBIN A1C  06/27/2016  . OPHTHALMOLOGY EXAM  06/28/2016  . MAMMOGRAM  12/22/2016  . COLONOSCOPY  08/15/2023  . TETANUS/TDAP  09/02/2024  . INFLUENZA VACCINE  Completed  . DEXA SCAN  Completed  . PNA vac Low Risk Adult  Completed    Physical Exam: Vitals:   12/31/15 0828  BP: 118/62  Pulse: 66  Temp: 98.4 F (36.9 C)  TempSrc: Oral  SpO2: 96%  Weight: 134 lb (60.8 kg)   Body mass index is 23 kg/m. Physical Exam  Constitutional: She is oriented to person, place, and time. She appears well-developed and well-nourished. No distress.  Cardiovascular: Normal rate, regular rhythm, normal heart sounds and intact distal pulses.   Pulmonary/Chest: Effort normal and breath sounds normal. No respiratory distress.  Abdominal: Soft. Bowel sounds are normal. She exhibits no distension.  Musculoskeletal: Normal range of motion.  Neurological: She is alert and oriented to person, place, and time.  See diabetic foot exam  Skin: Skin is warm and dry.  Psychiatric: She has a normal  mood and affect. Her behavior is normal. Judgment and thought content normal.    Labs reviewed: Basic Metabolic Panel:  Recent Labs  03/16/15 1041 08/10/15 1001 12/28/15 0843  NA 138 136 140  K 4.0 4.4 4.4  CL 95* 100 104  CO2 24 25 27   GLUCOSE 105* 110* 103*  BUN 14 20 15   CREATININE 0.96 1.01* 1.00*  CALCIUM 9.4 9.6 9.5   Liver Function Tests:  Recent Labs  03/16/15 1041  AST 15  ALT 8  ALKPHOS 69  BILITOT 0.3  PROT 7.4  ALBUMIN 4.5   No results for  input(s): LIPASE, AMYLASE in the last 8760 hours. No results for input(s): AMMONIA in the last 8760 hours. CBC:  Recent Labs  03/16/15 1041  WBC 4.4  NEUTROABS 1.5  HCT 40.7  MCV 89  PLT 307   Lipid Panel:  Recent Labs  08/10/15 1001  CHOL 129  HDL 42*  LDLCALC 54  TRIG 167*  CHOLHDL 3.1   Lab Results  Component Value Date   HGBA1C 5.9 (H) 12/28/2015    Assessment/Plan 1. Essential hypertension - bp at goal today, but we decided to stop hctz for her dry mouth and urinary frequency - advised to monitor her bp at home and call me back if she is having bps over 140/90 so her other meds can be increased - CBC with Differential/Platelet; Future - COMPLETE METABOLIC PANEL WITH GFR; Future  2. Controlled type 2 diabetes mellitus with stage 3 chronic kidney disease, without long-term current use of insulin (HCC) - well controlled (improved, in fact), cont metformin, asa, lovastatin, losartan - Hemoglobin A1c; Future  3. Hypercholesterolemia with hypertriglyceridemia - lipids have been satisfactory with her lovastatin, cont to monitor and work on diet - Lipid panel; Future  4. CKD (chronic kidney disease) stage 3, GFR 30-59 ml/min - cont to encourage water intake and not caffeine - avoid nsaids and other nephrotoxic agents and adjust appropriately when renally excreted - COMPLETE METABOLIC PANEL WITH GFR; Future  5. Overactive bladder -continues on detrol xl per urology--explained that that is  probably causing her dry mouth more than the hctz due to anticholinergic symptoms  6. Atrophic vaginitis - cont to follow with urology  Labs/tests ordered:   Orders Placed This Encounter  Procedures  . CBC with Differential/Platelet    Standing Status:   Future    Standing Expiration Date:   08/30/2016  . COMPLETE METABOLIC PANEL WITH GFR    [facility]    Standing Status:   Future    Standing Expiration Date:   08/30/2016  . Hemoglobin A1c    Standing Status:   Future    Standing Expiration Date:   08/30/2016  . Lipid panel    Standing Status:   Future    Standing Expiration Date:   08/30/2016    Order Specific Question:   Has the patient fasted?    Answer:   Yes   Next appt:  05/05/2016   Skyelynn Rambeau L. Kahlie Deutscher, D.O. [facility] [address], [place] [address] Cell Phone (Mon-Fri 8am-5pm):  [phone] On Call:  [phone] & follow prompts after 5pm & weekends Office Phone:  [phone] Office Fax:  [phone]

## 2016-01-18 ENCOUNTER — Other Ambulatory Visit: Payer: Self-pay | Admitting: *Deleted

## 2016-01-18 MED ORDER — METFORMIN HCL 1000 MG PO TABS: 1000.0000 mg | ORAL_TABLET | Freq: Two times a day (BID) | ORAL | 1 refills | Status: DC

## 2016-01-18 NOTE — Telephone Encounter (Signed)
Mandy Perry

## 2016-01-22 ENCOUNTER — Other Ambulatory Visit: Payer: Self-pay | Admitting: *Deleted

## 2016-01-22 MED ORDER — AMLODIPINE BESYLATE 10 MG PO TABS: 10.0000 mg | ORAL_TABLET | Freq: Every day | ORAL | 1 refills | Status: DC

## 2016-01-22 NOTE — Telephone Encounter (Signed)
[facility] 

## 2016-03-11 ENCOUNTER — Ambulatory Visit (INDEPENDENT_AMBULATORY_CARE_PROVIDER_SITE_OTHER): Payer: Medicare Other | Admitting: Internal Medicine

## 2016-03-11 ENCOUNTER — Encounter: Payer: Self-pay | Admitting: Internal Medicine

## 2016-03-11 VITALS — BP 158/80 | HR 84 | Temp 98.6°F | Resp 12 | Ht 64.0 in | Wt 134.0 lb

## 2016-03-11 DIAGNOSIS — Z79899 Other long term (current) drug therapy: Secondary | ICD-10-CM

## 2016-03-11 DIAGNOSIS — B351 Tinea unguium: Secondary | ICD-10-CM | POA: Diagnosis not present

## 2016-03-11 LAB — HEPATIC FUNCTION PANEL
ALT: 9 U/L (ref 6–29)
AST: 12 U/L (ref 10–35)
Albumin: 4.3 g/dL (ref 3.6–5.1)
Alkaline Phosphatase: 55 U/L (ref 33–130)
Bilirubin, Direct: 0.1 mg/dL (ref <=0.2)
Indirect Bilirubin: 0.3 mg/dL (ref 0.2–1.2)
Total Bilirubin: 0.4 mg/dL (ref 0.2–1.2)
Total Protein: 7.2 g/dL (ref 6.1–8.1)

## 2016-03-11 MED ORDER — TERBINAFINE HCL 250 MG PO TABS: 250.0000 mg | ORAL_TABLET | Freq: Every day | ORAL | 0 refills | Status: AC

## 2016-03-11 NOTE — Progress Notes (Signed)
Location:  [facility] clinic Provider: Maybree Riling L. Mariea Perry, D.O., C.M.D.  Code Status: full code Goals of Care:  Advanced Directives 03/11/2016  Does Patient Have a Medical Advance Directive? No  Would patient like information on creating a medical advance directive? -  Pre-existing out of facility DNR order (yellow form or pink MOST form) -  Again reviewed need to complete living will and hcpoa  Chief Complaint  Patient presents with  . Acute Visit    Patient c/o fungus on finger nails (both hands) x 3 weeks   . Advance Directive    Discuss Advance Directive (handout to be given to patient)   . Medication Refill    No refills needed today     HPI: Patient is a 76 y.o. female seen today for an acute visit for fungal nails on fingers.  Had gone to get them done before a cruise when went on early last month and it was starting before that.  Tried topical antifungal otc for nail fungus--didn't apply twice a day but used off and on since 2/18.  No improvement at all.  Both thumbs and left ring finger.  She had it before.   Past Medical History:  Diagnosis Date  . Benign neoplasm of colon   . Diabetes mellitus without complication (HCC)    123456 currently a goal  . Essential hypertension, benign   . GERD (gastroesophageal reflux disease)   . Hyperlipidemia    Followed by PCP  . Hypertension   . Lumbago   . Migraine, unspecified, without mention of intractable migraine without mention of status migrainosus   . Osteoarthrosis, unspecified whether generalized or localized, unspecified site   . Palpitations    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  . Solitary pulmonary nodule 2012  . Urinary frequency     Past Surgical History:  Procedure Laterality Date  . ABDOMINAL HYSTERECTOMY    . CARDIAC CATHETERIZATION  11/15/1994   Patent coronary arteries and normal LV function  . CARDIAC CATHETERIZATION  11/03/2003   Normal coronary arteries  . COLONOSCOPY  2010   Dr.Bucinni, Normal   . COLONOSCOPY   2004   Dr.Bucinni  . EXERCISE STRESS TEST  11/12/1987   Positive  . TRANSTHORACIC ECHOCARDIOGRAM  05/19/2010   EF >55%, normal-mild    Allergies  Allergen Reactions  . Azithromycin Hives  . Statins     Myalgias-high doses  . Tramadol Nausea Only  . Gemfibrozil Other (See Comments)    Leg cramps at night    Allergies as of 03/11/2016      Reactions   Azithromycin Hives   Statins    Myalgias-high doses   Tramadol Nausea Only   Gemfibrozil Other (See Comments)   Leg cramps at night      Medication List       Accurate as of 03/11/16  8:23 AM. Always use your most recent med list.          acetaminophen 500 MG tablet Commonly known as:  TYLENOL Take 500 mg by mouth as needed for pain.   amLODipine 10 MG tablet Commonly known as:  NORVASC Take 1 tablet (10 mg total) by mouth daily.   aspirin 325 MG tablet Take 1/2 tablet by mouth daily   conjugated estrogens vaginal cream Commonly known as:  PREMARIN Pea size every 2-3 days   HYDROcodone-acetaminophen 5-325 MG tablet Commonly known as:  NORCO/VICODIN Take 1 by mouth 2-3 times daily as needed for knee pain   lansoprazole 30 MG  capsule Commonly known as:  PREVACID Take 30 mg by mouth daily at 12 noon.   losartan 50 MG tablet Commonly known as:  COZAAR Take 1 tablet (50 mg total) by mouth daily.   lovastatin 10 MG tablet Commonly known as:  MEVACOR Take one tablet by mouth daily with evening meal or at bedtime   meloxicam 15 MG tablet Commonly known as:  MOBIC Take 1 by mouth daily as needed for pain/inflammation   metFORMIN 1000 MG tablet Commonly known as:  GLUCOPHAGE Take 1 tablet (1,000 mg total) by mouth 2 (two) times daily with a meal.   metoprolol 100 MG tablet Commonly known as:  LOPRESSOR Take 0.5 tablets (50 mg total) by mouth 2 (two) times daily.   multivitamin tablet Take 1 tablet by mouth daily.   Omega-3 1000 MG Caps Take 1 g by mouth.   tolterodine 4 MG 24 hr capsule Commonly known  as:  DETROL LA Take 4 mg by mouth daily.   Vitamin D3 2000 units Chew Chew 1 capsule by mouth daily.       Review of Systems:  Review of Systems  Constitutional: Negative for chills and fever.  HENT: Positive for hearing loss.   Eyes:       Glasses  Respiratory: Negative for shortness of breath.   Cardiovascular: Negative for chest pain and palpitations.  Musculoskeletal: Negative for falls.  Skin:       Fungus beneath index and bilateral thumbs  Neurological: Positive for tingling and sensory change.  Psychiatric/Behavioral: Negative for depression.    Health Maintenance  Topic Date Due  . HEMOGLOBIN A1C  06/27/2016  . OPHTHALMOLOGY EXAM  06/28/2016  . FOOT EXAM  12/30/2016  . COLONOSCOPY  08/15/2023  . TETANUS/TDAP  09/02/2024  . INFLUENZA VACCINE  Completed  . DEXA SCAN  Completed  . PNA vac Low Risk Adult  Completed    Physical Exam: Vitals:   03/11/16 0811  BP: (!) 158/80  Pulse: 84  Resp: 12  Temp: 98.6 F (37 C)  TempSrc: Oral  SpO2: 96%  Weight: 134 lb (60.8 kg)  Height: 5\' 4"  (1.626 m)   Body mass index is 23 kg/m. Physical Exam  Constitutional: She is oriented to person, place, and time. She appears well-developed and well-nourished. No distress.  Cardiovascular: Normal rate, regular rhythm, normal heart sounds and intact distal pulses.   Pulmonary/Chest: Effort normal and breath sounds normal. No respiratory distress.  Musculoskeletal: Normal range of motion.  Neurological: She is alert and oriented to person, place, and time.  Skin:  [name] fungus beneath bilateral thumb nails causing nails to turn up and also on left 4th digit  Psychiatric: She has a normal mood and affect.    Labs reviewed: Basic Metabolic Panel:  Recent Labs  03/16/15 1041 08/10/15 1001 12/28/15 0843  NA 138 136 140  K 4.0 4.4 4.4  CL 95* 100 104  CO2 24 25 27   GLUCOSE 105* 110* 103*  BUN 14 20 15   CREATININE 0.96 1.01* 1.00*  CALCIUM 9.4 9.6 9.5   Liver  Function Tests:  Recent Labs  03/16/15 1041  AST 15  ALT 8  ALKPHOS 69  BILITOT 0.3  PROT 7.4  ALBUMIN 4.5   No results for input(s): LIPASE, AMYLASE in the last 8760 hours. No results for input(s): AMMONIA in the last 8760 hours. CBC:  Recent Labs  03/16/15 1041  WBC 4.4  NEUTROABS 1.5  HCT 40.7  MCV 89  PLT 307  Lipid Panel:  Recent Labs  08/10/15 1001  CHOL 129  HDL 42*  LDLCALC 54  TRIG 167*  CHOLHDL 3.1   Lab Results  Component Value Date   HGBA1C 5.9 (H) 12/28/2015    Assessment/Plan 1. Onychomycosis - recurrent on fingernails as in hpi -not having luck with topicals - obtain liver panel first, then may fill lamisil rx if wnl - terbinafine (LAMISIL) 250 MG tablet; Take 1 tablet (250 mg total) by mouth daily.  Dispense: 42 tablet; Refill: 0  2. Medication management - pre-tx, check liver panel - Hepatic Function Panel  Labs/tests ordered:   Orders Placed This Encounter  Procedures  . Hepatic Function Panel   Next appt:  05/03/2016  Chisom Aust L. Niya Behler, D.O. [facility] [address], [place] [address] Cell Phone (Mon-Fri 8am-5pm):  [phone] On Call:  [phone] & follow prompts after 5pm & weekends Office Phone:  [phone] Office Fax:  [phone]

## 2016-03-14 ENCOUNTER — Encounter: Payer: Self-pay | Admitting: *Deleted

## 2016-04-07 ENCOUNTER — Ambulatory Visit
Admission: RE | Admit: 2016-04-07 | Discharge: 2016-04-07 | Disposition: A | Discharge status: Home / Self Care | Payer: Medicare Other | Source: Ambulatory Visit | Attending: Internal Medicine | Admitting: Internal Medicine

## 2016-04-07 ENCOUNTER — Ambulatory Visit (INDEPENDENT_AMBULATORY_CARE_PROVIDER_SITE_OTHER): Payer: Medicare Other | Admitting: Internal Medicine

## 2016-04-07 ENCOUNTER — Encounter: Payer: Self-pay | Admitting: Internal Medicine

## 2016-04-07 VITALS — BP 148/70 | HR 67 | Temp 98.8°F | Wt 136.0 lb

## 2016-04-07 DIAGNOSIS — J111 Influenza due to unidentified influenza virus with other respiratory manifestations: Secondary | ICD-10-CM

## 2016-04-07 DIAGNOSIS — R079 Chest pain, unspecified: Secondary | ICD-10-CM

## 2016-04-07 DIAGNOSIS — R69 Illness, unspecified: Secondary | ICD-10-CM

## 2016-04-07 DIAGNOSIS — R059 Cough, unspecified: Secondary | ICD-10-CM

## 2016-04-07 DIAGNOSIS — R05 Cough: Secondary | ICD-10-CM | POA: Diagnosis not present

## 2016-04-07 LAB — POCT INFLUENZA A/B
Influenza A, POC: NEGATIVE
Influenza B, POC: NEGATIVE

## 2016-04-07 MED ORDER — OSELTAMIVIR PHOSPHATE 30 MG PO CAPS: 30.0000 mg | ORAL_CAPSULE | Freq: Two times a day (BID) | ORAL | 0 refills | Status: AC

## 2016-04-07 NOTE — Patient Instructions (Signed)

## 2016-04-07 NOTE — Progress Notes (Signed)
Location:  [facility] clinic Provider: Melanye Hiraldo L. [name], D.O., C.M.D.  Code Status: full code Goals of Care:  Advanced Directives 03/11/2016  Does Patient Have a Medical Advance Directive? No  Would patient like information on creating a medical advance directive? -  Pre-existing out of facility DNR order (yellow form or pink MOST form) -   Chief Complaint  Patient presents with  . Acute Visit    cough, headaches, x3 days    HPI: Patient is a 76 y.o. female seen today for an acute visit for cough, headaches x 3 days.  Feels like something is in her right lung, feels tight.  Sore throat and has been gargling and that part got better.  Some myalgias, not real bad, but has been taking tylenol.  No noted fever, chills (but on tylenol).  Mucus is not discolored from nose.  Ends up swallowing mucus--unable to expectorate.  No known sick contacts. Reports wheezing at hs.  Past Medical History:  Diagnosis Date  . Benign neoplasm of colon   . Diabetes mellitus without complication (HCC)    W0J currently a goal  . Essential hypertension, benign   . GERD (gastroesophageal reflux disease)   . Hyperlipidemia    Followed by PCP  . Hypertension   . Lumbago   . Migraine, unspecified, without mention of intractable migraine without mention of status migrainosus   . Osteoarthrosis, unspecified whether generalized or localized, unspecified site   . Palpitations    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  . Solitary pulmonary nodule 2012  . Urinary frequency     Past Surgical History:  Procedure Laterality Date  . ABDOMINAL HYSTERECTOMY    . CARDIAC CATHETERIZATION  11/15/1994   Patent coronary arteries and normal LV function  . CARDIAC CATHETERIZATION  11/03/2003   Normal coronary arteries  . COLONOSCOPY  2010   Dr.Bucinni, Normal   . COLONOSCOPY  2004   Dr.Bucinni  . EXERCISE STRESS TEST  11/12/1987   Positive  . TRANSTHORACIC ECHOCARDIOGRAM  05/19/2010   EF >55%, normal-mild    Allergies  Allergen  Reactions  . Azithromycin Hives  . Statins     Myalgias-high doses  . Tramadol Nausea Only  . Gemfibrozil Other (See Comments)    Leg cramps at night    Allergies as of 04/07/2016      Reactions   Azithromycin Hives   Statins    Myalgias-high doses   Tramadol Nausea Only   Gemfibrozil Other (See Comments)   Leg cramps at night      Medication List       Accurate as of 04/07/16  9:27 AM. Always use your most recent med list.          acetaminophen 500 MG tablet Commonly known as:  TYLENOL Take 500 mg by mouth as needed for pain.   amLODipine 10 MG tablet Commonly known as:  NORVASC Take 1 tablet (10 mg total) by mouth daily.   aspirin 325 MG tablet Take 1/2 tablet by mouth daily   conjugated estrogens vaginal cream Commonly known as:  PREMARIN Pea size every 2-3 days   lansoprazole 30 MG capsule Commonly known as:  PREVACID Take 30 mg by mouth daily at 12 noon.   losartan 50 MG tablet Commonly known as:  COZAAR Take 1 tablet (50 mg total) by mouth daily.   lovastatin 10 MG tablet Commonly known as:  MEVACOR Take one tablet by mouth daily with evening meal or at bedtime   metFORMIN 1000  MG tablet Commonly known as:  GLUCOPHAGE Take 1 tablet (1,000 mg total) by mouth 2 (two) times daily with a meal.   metoprolol 100 MG tablet Commonly known as:  LOPRESSOR Take 0.5 tablets (50 mg total) by mouth 2 (two) times daily.   multivitamin tablet Take 1 tablet by mouth daily.   Omega-3 1000 MG Caps Take 1 g by mouth.   terbinafine 250 MG tablet Commonly known as:  LAMISIL Take 1 tablet (250 mg total) by mouth daily.   tolterodine 4 MG 24 hr capsule Commonly known as:  DETROL LA Take 4 mg by mouth daily.   Vitamin D3 2000 units Chew Chew 1 capsule by mouth daily.       Review of Systems:  Review of Systems  Constitutional: Positive for malaise/fatigue. Negative for chills and fever.  HENT: Positive for congestion and ear pain. Negative for sinus  pain and sore throat.        Intermittent right ear pain  Eyes: Negative for blurred vision.  Respiratory: Positive for cough. Negative for sputum production and shortness of breath.   Cardiovascular: Positive for chest pain. Negative for palpitations, orthopnea, claudication, leg swelling and PND.       Right sided with cough  Gastrointestinal: Negative for abdominal pain, diarrhea, nausea and vomiting.  Musculoskeletal: Positive for myalgias. Negative for falls.  Skin: Negative for itching and rash.  Neurological: Positive for headaches. Negative for dizziness and loss of consciousness.  Endo/Heme/Allergies: Does not bruise/bleed easily.  Psychiatric/Behavioral: Negative for depression.    Health Maintenance  Topic Date Due  . HEMOGLOBIN A1C  06/27/2016  . OPHTHALMOLOGY EXAM  06/28/2016  . FOOT EXAM  12/30/2016  . COLONOSCOPY  08/15/2023  . TETANUS/TDAP  09/02/2024  . INFLUENZA VACCINE  Completed  . DEXA SCAN  Completed  . PNA vac Low Risk Adult  Completed    Physical Exam: Vitals:   04/07/16 0912  BP: (!) 148/70  Pulse: 67  Temp: 98.8 F (37.1 C)  TempSrc: Oral  SpO2: 94%  Weight: 136 lb (61.7 kg)   Body mass index is 23.34 kg/m. Physical Exam  Constitutional: She is oriented to person, place, and time. No distress.  HENT:  Head: Normocephalic and atraumatic.  Right Ear: External ear normal.  Left Ear: External ear normal.  Nose: Nose normal.  Mouth/Throat: Oropharynx is clear and moist. No oropharyngeal exudate.  Eyes: Conjunctivae are normal.  Neck: Neck supple.  Cardiovascular: Normal rate, regular rhythm, normal heart sounds and intact distal pulses.   Pulmonary/Chest: Effort normal. No respiratory distress. She has wheezes. She has no rales.  Wet coarse cough  Abdominal: Bowel sounds are normal.  Musculoskeletal: Normal range of motion.  Lymphadenopathy:    She has no cervical adenopathy.  Neurological: She is alert and oriented to person, place, and  time.  Skin: Skin is warm and dry.  Psychiatric: She has a normal mood and affect.    Labs reviewed: Basic Metabolic Panel:  Recent Labs  08/10/15 1001 12/28/15 0843  NA 136 140  K 4.4 4.4  CL 100 104  CO2 25 27  GLUCOSE 110* 103*  BUN 20 15  CREATININE 1.01* 1.00*  CALCIUM 9.6 9.5   Liver Function Tests:  Recent Labs  03/11/16 0848  AST 12  ALT 9  ALKPHOS 55  BILITOT 0.4  PROT 7.2  ALBUMIN 4.3   No results for input(s): LIPASE, AMYLASE in the last 8760 hours. No results for input(s): AMMONIA in the last  8760 hours. CBC: No results for input(s): WBC, NEUTROABS, HGB, HCT, MCV, PLT in the last 8760 hours. Lipid Panel:  Recent Labs  08/10/15 1001  CHOL 129  HDL 42*  LDLCALC 54  TRIG 167*  CHOLHDL 3.1   Lab Results  Component Value Date   HGBA1C 5.9 (H) 12/28/2015    Assessment/Plan 1. Right-sided chest pain - DG Chest 2 View; Future - POCT Rapid Influenza A&B was negative, but I'm suspicious enough to still treat her so given tamiflu 30mg  po bid for 5 days  2. Cough -see #1, due to 3 - DG Chest 2 View; Future - POCT Rapid Influenza A&B  3. Influenza-like illness -I suspect this really is flu b, but rapid flu negative (test did not seem to run properly per CMA and pt with the classic symptoms) -treating with tamiflu and check for pneumonia also on cxr due to chest pain  Labs/tests ordered:   Orders Placed This Encounter  Procedures  . DG Chest 2 View    Standing Status:   Future    Standing Expiration Date:   06/07/2017    Order Specific Question:   Reason for Exam (SYMPTOM  OR DIAGNOSIS REQUIRED)    Answer:   right-sided chest pain, cough, no fever or myalgias    Order Specific Question:   Preferred imaging location?    Answer:   [id number]    Next appt:  05/03/2016  Jaryn Hocutt L. Akeira Lahm, D.O. [facility] [address],  [address] Cell Phone (Mon-Fri 8am-5pm):  [phone] On  Call:  [phone] & follow prompts after 5pm & weekends Office Phone:  [phone] Office Fax:  [phone]

## 2016-04-25 ENCOUNTER — Encounter: Payer: Self-pay | Admitting: Nurse Practitioner

## 2016-04-25 ENCOUNTER — Ambulatory Visit (INDEPENDENT_AMBULATORY_CARE_PROVIDER_SITE_OTHER): Payer: Medicare Other | Admitting: Nurse Practitioner

## 2016-04-25 VITALS — BP 128/78 | HR 75 | Temp 98.2°F | Resp 17 | Ht 64.0 in | Wt 136.0 lb

## 2016-04-25 DIAGNOSIS — R300 Dysuria: Secondary | ICD-10-CM | POA: Diagnosis not present

## 2016-04-25 LAB — POCT URINALYSIS DIPSTICK
BILIRUBIN UA: NEGATIVE
Blood, UA: NEGATIVE
GLUCOSE UA: NEGATIVE
Ketones, UA: NEGATIVE
Nitrite, UA: NEGATIVE
Protein, UA: 300
Spec Grav, UA: 1.025 (ref 1.010–1.025)
Urobilinogen, UA: NEGATIVE E.U./dL — AB
pH, UA: 5 (ref 5.0–8.0)

## 2016-04-25 MED ORDER — CIPROFLOXACIN HCL 500 MG PO TABS: 500.0000 mg | ORAL_TABLET | Freq: Two times a day (BID) | ORAL | 0 refills | Status: DC

## 2016-04-25 MED ORDER — TOLTERODINE TARTRATE ER 4 MG PO CP24: 4.0000 mg | ORAL_CAPSULE | Freq: Every day | ORAL | 3 refills | Status: DC

## 2016-04-25 NOTE — Progress Notes (Signed)
Careteam: Patient Care Team: Gayland Curry, DO as PCP - General (Geriatric Medicine) Leonie Man, MD as Consulting Physician (Cardiology)  Advanced Directive information Does Patient Have a Medical Advance Directive?: Yes, Type of Advance Directive: [place];Living will  Allergies  Allergen Reactions  . Azithromycin Hives  . Statins     Myalgias-high doses  . Tramadol Nausea Only  . Gemfibrozil Other (See Comments)    Leg cramps at night    Chief Complaint  Patient presents with  . Acute Visit    Pt is being seen due to burning, frequent urination x 3 days.      HPI: Patient is a 76 y.o. female seen in the office today due to burning with urination for 3 days. Increased frequency and urgency. No fevers or chills. No back pain. Almost can not make it to the bathroom at night. Had UTI in the past but not frequent.  Trying to increase hydration.  Blood sugars are normally okay, worse over the weekend due to drinking cranberry juice.   Review of Systems:  Review of Systems  Constitutional: Negative for activity change, appetite change, fatigue and fever.  Gastrointestinal: Positive for constipation. Negative for diarrhea.  Genitourinary: Positive for dysuria, frequency and urgency.    Past Medical History:  Diagnosis Date  . Benign neoplasm of colon   . Diabetes mellitus without complication (HCC)    H2D currently a goal  . Essential hypertension, benign   . GERD (gastroesophageal reflux disease)   . Hyperlipidemia    Followed by PCP  . Hypertension   . Lumbago   . Migraine, unspecified, without mention of intractable migraine without mention of status migrainosus   . Osteoarthrosis, unspecified whether generalized or localized, unspecified site   . Palpitations    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  . Solitary pulmonary nodule 2012  . Urinary frequency    Past Surgical History:  Procedure Laterality Date  . ABDOMINAL HYSTERECTOMY    .  CARDIAC CATHETERIZATION  11/15/1994   Patent coronary arteries and normal LV function  . CARDIAC CATHETERIZATION  11/03/2003   Normal coronary arteries  . COLONOSCOPY  2010   Dr.Bucinni, Normal   . COLONOSCOPY  2004   Dr.Bucinni  . EXERCISE STRESS TEST  11/12/1987   Positive  . TRANSTHORACIC ECHOCARDIOGRAM  05/19/2010   EF >55%, normal-mild   Social History:   reports that she quit smoking about 39 years ago. Her smoking use included Cigarettes. She has never used smokeless tobacco. She reports that she does not drink alcohol or use drugs.  Family History  Problem Relation Age of Onset  . CVA Mother   . Heart attack Father   . Cancer Father     Prostate cancer  . COPD Brother   . Diabetes Sister   . Colon cancer Sister   . Diabetes Sister   . Cancer Brother     Medications: Patient's Medications  New Prescriptions   No medications on file  Previous Medications   ACETAMINOPHEN (TYLENOL) 500 MG TABLET    Take 500 mg by mouth as needed for pain.   AMLODIPINE (NORVASC) 10 MG TABLET    Take 1 tablet (10 mg total) by mouth daily.   ASPIRIN 325 MG TABLET    Take 1/2 tablet by mouth daily   CHOLECALCIFEROL (VITAMIN D3) 2000 UNITS CHEW    Chew 1 capsule by mouth daily.   CONJUGATED ESTROGENS (PREMARIN) VAGINAL CREAM    Pea size  every 2-3 days   LANSOPRAZOLE (PREVACID) 30 MG CAPSULE    Take 30 mg by mouth daily at 12 noon.    LOSARTAN (COZAAR) 50 MG TABLET    Take 1 tablet (50 mg total) by mouth daily.   LOVASTATIN (MEVACOR) 10 MG TABLET    Take one tablet by mouth daily with evening meal or at bedtime   METFORMIN (GLUCOPHAGE) 1000 MG TABLET    Take 1 tablet (1,000 mg total) by mouth 2 (two) times daily with a meal.   METOPROLOL (LOPRESSOR) 100 MG TABLET    Take 0.5 tablets (50 mg total) by mouth 2 (two) times daily.   MULTIPLE VITAMIN (MULTIVITAMIN) TABLET    Take 1 tablet by mouth daily.    OMEGA-3 1000 MG CAPS    Take 1 g by mouth.  Modified Medications   Modified Medication  Previous Medication   TOLTERODINE (DETROL LA) 4 MG 24 HR CAPSULE tolterodine (DETROL LA) 4 MG 24 hr capsule      Take 1 capsule (4 mg total) by mouth daily.    Take 4 mg by mouth daily.  Discontinued Medications   No medications on file     Physical Exam:  Vitals:   04/25/16 1556  BP: 128/78  Pulse: 75  Resp: 17  Temp: 98.2 F (36.8 C)  TempSrc: Oral  SpO2: 95%  Weight: 136 lb (61.7 kg)  Height: 5\' 4"  (1.626 m)   Body mass index is 23.34 kg/m.  Physical Exam  Constitutional: She is oriented to person, place, and time. She appears well-developed and well-nourished. No distress.  Cardiovascular: Normal rate, regular rhythm and normal heart sounds.   Pulmonary/Chest: Effort normal and breath sounds normal. No respiratory distress.  Abdominal: Soft. Bowel sounds are normal. She exhibits no distension. There is no tenderness.  Neurological: She is alert and oriented to person, place, and time.  Skin: Skin is warm and dry. She is not diaphoretic.   Labs reviewed: Basic Metabolic Panel:  Recent Labs  08/10/15 1001 12/28/15 0843  NA 136 140  K 4.4 4.4  CL 100 104  CO2 25 27  GLUCOSE 110* 103*  BUN 20 15  CREATININE 1.01* 1.00*  CALCIUM 9.6 9.5   Liver Function Tests:  Recent Labs  03/11/16 0848  AST 12  ALT 9  ALKPHOS 55  BILITOT 0.4  PROT 7.2  ALBUMIN 4.3   No results for input(s): LIPASE, AMYLASE in the last 8760 hours. No results for input(s): AMMONIA in the last 8760 hours. CBC: No results for input(s): WBC, NEUTROABS, HGB, HCT, MCV, PLT in the last 8760 hours. Lipid Panel:  Recent Labs  08/10/15 1001  CHOL 129  HDL 42*  LDLCALC 54  TRIG 167*  CHOLHDL 3.1   TSH: No results for input(s): TSH in the last 8760 hours. A1C: Lab Results  Component Value Date   HGBA1C 5.9 (H) 12/28/2015     Assessment/Plan 1. Dysuria - POCT urinalysis dipstick abnormal, will send for  Culture, Urine -to increase fluids - ciprofloxacin (CIPRO) 500 MG  tablet; Take 1 tablet (500 mg total) by mouth 2 (two) times daily.  Dispense: 14 tablet; Refill: 0 -florastor BID while on antibiotics   Bristol Osentoski K. Harle Battiest  [facility] [phone] 8 am - 5 pm) [phone] (after hours)

## 2016-04-25 NOTE — Patient Instructions (Addendum)
Cont to increase hydration - limit sweet drinks Use florastor twice daily while on antibiotic  To take cipro twice daily for 7 days-- we will let you know when culture comes back if you need something different

## 2016-04-26 ENCOUNTER — Other Ambulatory Visit: Payer: Self-pay | Admitting: *Deleted

## 2016-04-26 LAB — URINE CULTURE

## 2016-05-03 ENCOUNTER — Other Ambulatory Visit: Payer: Medicare Other

## 2016-05-04 ENCOUNTER — Other Ambulatory Visit: Payer: Medicare Other

## 2016-05-04 DIAGNOSIS — N183 Chronic kidney disease, stage 3 unspecified: Secondary | ICD-10-CM

## 2016-05-04 DIAGNOSIS — E782 Mixed hyperlipidemia: Secondary | ICD-10-CM

## 2016-05-04 DIAGNOSIS — E1122 Type 2 diabetes mellitus with diabetic chronic kidney disease: Secondary | ICD-10-CM

## 2016-05-04 DIAGNOSIS — I10 Essential (primary) hypertension: Secondary | ICD-10-CM

## 2016-05-04 LAB — COMPLETE METABOLIC PANEL WITH GFR
AG Ratio: 1.5 Ratio (ref 1.0–2.5)
ALT: 8 U/L (ref 6–29)
AST: 12 U/L (ref 10–35)
Albumin: 4.1 g/dL (ref 3.6–5.1)
Alkaline Phosphatase: 55 U/L (ref 33–130)
BUN/Creatinine Ratio: 17.8 Ratio (ref 6–22)
BUN: 19 mg/dL (ref 7–25)
CO2: 25 mmol/L (ref 20–31)
Calcium: 9.3 mg/dL (ref 8.6–10.4)
Chloride: 104 mmol/L (ref 98–110)
Creat: 1.07 mg/dL — ABNORMAL HIGH (ref 0.60–0.93)
GFR, Est African American: 59 mL/min — ABNORMAL LOW (ref >=60)
GFR, Est Non African American: 51 mL/min — ABNORMAL LOW (ref >=60)
Globulin: 2.8 g/dL (ref 1.9–3.7)
Glucose, Bld: 95 mg/dL (ref 65–99)
Potassium: 4.4 mmol/L (ref 3.5–5.3)
Sodium: 138 mmol/L (ref 135–146)
Total Bilirubin: 0.4 mg/dL (ref 0.2–1.2)
Total Protein: 6.9 g/dL (ref 6.1–8.1)

## 2016-05-04 LAB — CBC WITH DIFFERENTIAL/PLATELET
Basophils Absolute: 0 cells/uL (ref 0–200)
Basophils Relative: 0 %
Eosinophils Absolute: 147 cells/uL (ref 15–500)
Eosinophils Relative: 3 %
HCT: 39.2 % (ref 35.0–45.0)
Hemoglobin: 12.6 g/dL (ref 11.7–15.5)
Lymphocytes Relative: 39 %
Lymphs Abs: 1911 cells/uL (ref 850–3900)
MCH: 28.8 pg (ref 27.0–33.0)
MCHC: 32.1 g/dL (ref 32.0–36.0)
MCV: 89.7 fL (ref 80.0–100.0)
MPV: 9.7 fL (ref 7.5–12.5)
Monocytes Absolute: 490 cells/uL (ref 200–950)
Monocytes Relative: 10 %
Neutro Abs: 2352 cells/uL (ref 1500–7800)
Neutrophils Relative %: 48 %
Platelets: 309 10*3/uL (ref 140–400)
RBC: 4.37 MIL/uL (ref 3.80–5.10)
RDW: 14.7 % (ref 11.0–15.0)
WBC: 4.9 10*3/uL (ref 3.8–10.8)

## 2016-05-04 LAB — LIPID PANEL
Cholesterol: 176 mg/dL (ref <200)
HDL: 42 mg/dL — ABNORMAL LOW (ref >50)
LDL Cholesterol: 105 mg/dL — ABNORMAL HIGH (ref <100)
Total CHOL/HDL Ratio: 4.2 Ratio (ref <5.0)
Triglycerides: 143 mg/dL (ref <150)
VLDL: 29 mg/dL (ref <30)

## 2016-05-05 ENCOUNTER — Encounter: Payer: Medicare Other | Admitting: Internal Medicine

## 2016-05-05 ENCOUNTER — Ambulatory Visit: Payer: Medicare Other

## 2016-05-05 ENCOUNTER — Encounter: Payer: Self-pay | Admitting: *Deleted

## 2016-05-05 LAB — HEMOGLOBIN A1C
Hgb A1c MFr Bld: 5.9 % — ABNORMAL HIGH (ref <5.7)
Mean Plasma Glucose: 123 mg/dL

## 2016-05-16 ENCOUNTER — Other Ambulatory Visit: Payer: Self-pay | Admitting: *Deleted

## 2016-05-16 ENCOUNTER — Ambulatory Visit (INDEPENDENT_AMBULATORY_CARE_PROVIDER_SITE_OTHER): Payer: Medicare Other | Admitting: Internal Medicine

## 2016-05-16 ENCOUNTER — Encounter: Payer: Self-pay | Admitting: Internal Medicine

## 2016-05-16 ENCOUNTER — Ambulatory Visit: Payer: Medicare Other

## 2016-05-16 VITALS — BP 140/70 | HR 71 | Temp 98.5°F | Ht 64.0 in | Wt 137.0 lb

## 2016-05-16 DIAGNOSIS — N183 Chronic kidney disease, stage 3 unspecified: Secondary | ICD-10-CM

## 2016-05-16 DIAGNOSIS — M8580 Other specified disorders of bone density and structure, unspecified site: Secondary | ICD-10-CM

## 2016-05-16 DIAGNOSIS — B351 Tinea unguium: Secondary | ICD-10-CM | POA: Diagnosis not present

## 2016-05-16 DIAGNOSIS — Z0000 Encounter for general adult medical examination without abnormal findings: Secondary | ICD-10-CM | POA: Diagnosis not present

## 2016-05-16 DIAGNOSIS — E782 Mixed hyperlipidemia: Secondary | ICD-10-CM

## 2016-05-16 DIAGNOSIS — Z1239 Encounter for other screening for malignant neoplasm of breast: Secondary | ICD-10-CM

## 2016-05-16 DIAGNOSIS — Z1231 Encounter for screening mammogram for malignant neoplasm of breast: Secondary | ICD-10-CM | POA: Diagnosis not present

## 2016-05-16 DIAGNOSIS — N3281 Overactive bladder: Secondary | ICD-10-CM | POA: Diagnosis not present

## 2016-05-16 DIAGNOSIS — I10 Essential (primary) hypertension: Secondary | ICD-10-CM | POA: Diagnosis not present

## 2016-05-16 DIAGNOSIS — Z23 Encounter for immunization: Secondary | ICD-10-CM | POA: Diagnosis not present

## 2016-05-16 DIAGNOSIS — E1122 Type 2 diabetes mellitus with diabetic chronic kidney disease: Secondary | ICD-10-CM | POA: Diagnosis not present

## 2016-05-16 MED ORDER — LOVASTATIN 10 MG PO TABS: 20.0000 mg | ORAL_TABLET | Freq: Every day | ORAL | 0 refills | Status: DC

## 2016-05-16 MED ORDER — ZOSTER VAC RECOMB ADJUVANTED 50 MCG/0.5ML IM SUSR: 0.5000 mL | Freq: Once | INTRAMUSCULAR | 1 refills | Status: AC

## 2016-05-16 MED ORDER — AMLODIPINE BESYLATE 10 MG PO TABS: 10.0000 mg | ORAL_TABLET | Freq: Every day | ORAL | 1 refills | Status: DC

## 2016-05-16 MED ORDER — TERBINAFINE HCL 250 MG PO TABS: 250.0000 mg | ORAL_TABLET | Freq: Every day | ORAL | 0 refills | Status: DC

## 2016-05-16 NOTE — Patient Instructions (Addendum)
Please bring [place] a copy of your living will and health care power of attorney.  Increase your lovastatin (mevacor) to 20mg  from 10mg  in the evening (you may take 2 of the 10mg  pills).  Please go get your shingrix vaccines at the [facility] on [facility].    Please reschedule your annual wellness visit with Clarise Cruz, RN here at the office.

## 2016-05-16 NOTE — Telephone Encounter (Signed)
[facility] 
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
## 2016-05-16 NOTE — Addendum Note (Signed)
Addended by: Hollace Kinnier L on: 05/16/2016 03:07 PM   Modules accepted: Level of Service

## 2016-05-17 ENCOUNTER — Other Ambulatory Visit: Payer: Self-pay

## 2016-05-17 DIAGNOSIS — E782 Mixed hyperlipidemia: Secondary | ICD-10-CM

## 2016-05-17 MED ORDER — LOVASTATIN 20 MG PO TABS: 20.0000 mg | ORAL_TABLET | Freq: Every day | ORAL | 1 refills | Status: DC

## 2016-05-25 ENCOUNTER — Ambulatory Visit (INDEPENDENT_AMBULATORY_CARE_PROVIDER_SITE_OTHER): Payer: Medicare Other

## 2016-05-25 VITALS — BP 130/72 | HR 74 | Temp 98.4°F | Ht 64.0 in | Wt 137.0 lb

## 2016-05-25 DIAGNOSIS — Z0000 Encounter for general adult medical examination without abnormal findings: Secondary | ICD-10-CM | POA: Diagnosis not present

## 2016-05-25 NOTE — Progress Notes (Signed)
Quick Notes   Health Maintenance: None      Abnormal Screen: MMSE 2930. 05/16/2016     Patient Concerns: Since last Friday she feels like she has to urinate all the time     Nurse Concerns: None

## 2016-05-25 NOTE — Progress Notes (Signed)
Subjective:   Mandy Perry is a 76 y.o. female who presents for Medicare Annual (Subsequent) preventive examination.``     Objective:     Vitals: BP 130/72 (BP Location: Right Arm, Patient Position: Sitting)   Pulse 74   Temp 98.4 F (36.9 C) (Oral)   Ht 5\' 4"  (1.626 m)   Wt 137 lb (62.1 kg)   SpO2 95%   BMI 23.52 kg/m   Body mass index is 23.52 kg/m.   Tobacco History  Smoking Status  . Former Smoker  . Packs/day: 0.50  . Years: 20.00  . Types: Cigarettes  . Quit date: 01/10/1977  Smokeless Tobacco  . Never Used    Comment: Quit at age 23     Counseling given: Not Answered   Past Medical History:  Diagnosis Date  . Benign neoplasm of colon   . Diabetes mellitus without complication (HCC)    X4J currently a goal  . Essential hypertension, benign   . GERD (gastroesophageal reflux disease)   . Hyperlipidemia    Followed by PCP  . Hypertension   . Lumbago   . Migraine, unspecified, without mention of intractable migraine without mention of status migrainosus   . Osteoarthrosis, unspecified whether generalized or localized, unspecified site   . Palpitations    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  . Solitary pulmonary nodule 2012  . Urinary frequency    Past Surgical History:  Procedure Laterality Date  . ABDOMINAL HYSTERECTOMY    . CARDIAC CATHETERIZATION  11/15/1994   Patent coronary arteries and normal LV function  . CARDIAC CATHETERIZATION  11/03/2003   Normal coronary arteries  . COLONOSCOPY  2010   Dr.Bucinni, Normal   . COLONOSCOPY  2004   Dr.Bucinni  . EXERCISE STRESS TEST  11/12/1987   Positive  . TRANSTHORACIC ECHOCARDIOGRAM  05/19/2010   EF >55%, normal-mild   Family History  Problem Relation Age of Onset  . CVA Mother   . Heart attack Father   . Cancer Father        Prostate cancer  . COPD Brother   . Diabetes Sister   . Colon cancer Sister   . Diabetes Sister   . Cancer Brother    History  Sexual Activity  . Sexual activity: Not  Currently    Outpatient Encounter Prescriptions as of 05/25/2016  Medication Sig  . acetaminophen (TYLENOL) 500 MG tablet Take 500 mg by mouth as needed for pain.  Marland Kitchen amLODipine (NORVASC) 10 MG tablet Take 1 tablet (10 mg total) by mouth daily.  Marland Kitchen aspirin 325 MG tablet Take 1/2 tablet by mouth daily  . Cholecalciferol (VITAMIN D3) 2000 units CHEW Chew 1 capsule by mouth daily.  Marland Kitchen conjugated estrogens (PREMARIN) vaginal cream Pea size every 2-3 days  . lansoprazole (PREVACID) 30 MG capsule Take 30 mg by mouth daily at 12 noon.   Marland Kitchen losartan (COZAAR) 50 MG tablet Take 1 tablet (50 mg total) by mouth daily.  Marland Kitchen lovastatin (MEVACOR) 20 MG tablet Take 1 tablet (20 mg total) by mouth at bedtime.  . metFORMIN (GLUCOPHAGE) 1000 MG tablet Take 1 tablet (1,000 mg total) by mouth 2 (two) times daily with a meal.  . metoprolol (LOPRESSOR) 100 MG tablet Take 0.5 tablets (50 mg total) by mouth 2 (two) times daily.  . Multiple Vitamin (MULTIVITAMIN) tablet Take 1 tablet by mouth daily.   . Omega-3 1000 MG CAPS Take 1 g by mouth.  . terbinafine (LAMISIL) 250 MG tablet Take 1 tablet (250  mg total) by mouth daily.  Marland Kitchen tolterodine (DETROL LA) 4 MG 24 hr capsule Take 1 capsule (4 mg total) by mouth daily.   No facility-administered encounter medications on file as of 05/25/2016.     Activities of Daily Living In your present state of health, do you have any difficulty performing the following activities: 05/25/2016  Hearing? N  Vision? N  Difficulty concentrating or making decisions? N  Walking or climbing stairs? N  Dressing or bathing? N  Doing errands, shopping? N  Preparing Food and eating ? N  Using the Toilet? N  In the past six months, have you accidently leaked urine? N  Do you have problems with loss of bowel control? N  Managing your Medications? N  Managing your Finances? N  Housekeeping or managing your Housekeeping? N  Some recent data might be hidden    Patient Care Team: Gayland Curry, DO as PCP - General (Geriatric Medicine) Leonie Man, MD as Consulting Physician (Cardiology)    Assessment:    Exercise Activities and Dietary recommendations Current Exercise Habits: The patient does not participate in regular exercise at present, Exercise limited by: None identified  Goals    . Exercise 3x per week (30 min per time)          Starting today pt will return to her previous routine of walking       Fall Risk Fall Risk  05/25/2016 05/16/2016 04/25/2016 04/07/2016 03/11/2016  Falls in the past year? No No No No No  Number falls in past yr: - - - - -  Injury with Fall? - - - - -   Depression Screen PHQ 2/9 Scores 05/25/2016 05/16/2016 04/07/2016 12/31/2015  PHQ - 2 Score 2 0 0 0  PHQ- 9 Score 2 - - -     Cognitive Function MMSE - Mini Mental State Exam 05/16/2016 05/16/2016 03/19/2015 07/19/2013  Orientation to time 5 5 5 5   Orientation to Place 5 5 5 5   Registration 3 3 3 3   Attention/ Calculation 5 5 5 5   Recall 2 2 2 2   Language- name 2 objects 2 2 2 2   Language- repeat 1 1 1 1   Language- follow 3 step command 3 3 3 3   Language- read & follow direction 1 1 1 1   Write a sentence 1 1 1 1   Copy design 1 1 1 1   Total score 29 29 29 29         Immunization History  Administered Date(s) Administered  . Influenza,inj,Quad PF,36+ Mos 11/20/2012, 10/03/2013, 11/20/2014  . Influenza-Unspecified 10/07/2010, 11/03/2011, 11/21/2015  . Pneumococcal Conjugate-13 12/31/2012  . Pneumococcal Polysaccharide-23 11/20/2014  . Tdap 09/03/2014   Screening Tests Health Maintenance  Topic Date Due  . OPHTHALMOLOGY EXAM  06/28/2016  . INFLUENZA VACCINE  08/10/2016  . HEMOGLOBIN A1C  11/03/2016  . FOOT EXAM  12/30/2016  . COLONOSCOPY  08/15/2023  . TETANUS/TDAP  09/02/2024  . DEXA SCAN  Completed  . PNA vac Low Risk Adult  Completed      Plan:    I have personally reviewed and addressed the Medicare Annual Wellness questionnaire and have noted the following in the  patient's chart:  A. Medical and social history B. Use of alcohol, tobacco or illicit drugs  C. Current medications and supplements D. Functional ability and status E.  Nutritional status F.  Physical activity G. Advance directives H. List of other physicians I.  Hospitalizations, surgeries, and ER visits in previous  12 months J.  [place] to include hearing, vision, cognitive, depression L. Referrals and appointments - none  In addition, I have reviewed and discussed with patient certain preventive protocols, quality metrics, and best practice recommendations. A written personalized care plan for preventive services as well as general preventive health recommendations were provided to patient.  See attached scanned questionnaire for additional information.   Signed,   Rich Reining, RN Nurse Health Advisor   I have reviewed the health advisor's note and was available for consultation. I agree with documentation and plan.   Mycah Mcdougall S. Perlie Gold  [facility] [address] [place], [place] [address] [phone] Cell (Monday-Friday 8 AM - 5 PM) [phone] After 5 PM and follow prompts

## 2016-05-25 NOTE — Patient Instructions (Addendum)
Mandy Perry , Thank you for taking time to come for your Medicare Wellness Visit. I appreciate your ongoing commitment to your health goals. Please review the following plan we discussed and let me know if I can assist you in the future.   Screening recommendations/referrals: Colonoscopy up to date Mammogram up to date Bone Density up to date Recommended yearly ophthalmology/optometry visit for glaucoma screening and checkup Recommended yearly dental visit for hygiene and checkup  Vaccinations: Influenza vaccine up to date Pneumococcal vaccine up to date Tdap vaccine up to date. Due 09/02/2024 Shingles vaccine prescription in  Advanced directives: Pt brought in hers  Conditions/risks identified: None  Next appointment: Mandy Perry 9/13@ 8:30 am   Preventive Care 65 Years and Older, Female Preventive care refers to lifestyle choices and visits with your health care provider that can promote health and wellness. What does preventive care include?  A yearly physical exam. This is also called an annual well check.  Dental exams once or twice a year.  Routine eye exams. Ask your health care provider how often you should have your eyes checked.  Personal lifestyle choices, including:  Daily care of your teeth and gums.  Regular physical activity.  Eating a healthy diet.  Avoiding tobacco and drug use.  Limiting alcohol use.  Practicing safe sex.  Taking low-dose aspirin every day.  Taking vitamin and mineral supplements as recommended by your health care provider. What happens during an annual well check? The services and screenings done by your health care provider during your annual well check will depend on your age, overall health, lifestyle risk factors, and family history of disease. Counseling  Your health care provider may ask you questions about your:  Alcohol use.  Tobacco use.  Drug use.  Emotional well-being.  Home and relationship well-being.  Sexual  activity.  Eating habits.  History of falls.  Memory and ability to understand (cognition).  Work and work Statistician.  Reproductive health. Screening  You may have the following tests or measurements:  Height, weight, and BMI.  Blood pressure.  Lipid and cholesterol levels. These may be checked every 5 years, or more frequently if you are over 75 years old.  Skin check.  Lung cancer screening. You may have this screening every year starting at age 75 if you have a 30-pack-year history of smoking and currently smoke or have quit within the past 15 years.  Fecal occult blood test (FOBT) of the stool. You may have this test every year starting at age 75.  Flexible sigmoidoscopy or colonoscopy. You may have a sigmoidoscopy every 5 years or a colonoscopy every 10 years starting at age 75.  Hepatitis C blood test.  Hepatitis B blood test.  Sexually transmitted disease (STD) testing.  Diabetes screening. This is done by checking your blood sugar (glucose) after you have not eaten for a while (fasting). You may have this done every 1-3 years.  Bone density scan. This is done to screen for osteoporosis. You may have this done starting at age 75.  Mammogram. This may be done every 1-2 years. Talk to your health care provider about how often you should have regular mammograms. Talk with your health care provider about your test results, treatment options, and if necessary, the need for more tests. Vaccines  Your health care provider may recommend certain vaccines, such as:  Influenza vaccine. This is recommended every year.  Tetanus, diphtheria, and acellular pertussis (Tdap, Td) vaccine. You may need a Td booster every  10 years.  Zoster vaccine. You may need this after age 75.  Pneumococcal 13-valent conjugate (PCV13) vaccine. One dose is recommended after age 75.  Pneumococcal polysaccharide (PPSV23) vaccine. One dose is recommended after age 30. Talk to your health care  provider about which screenings and vaccines you need and how often you need them. This information is not intended to replace advice given to you by your health care provider. Make sure you discuss any questions you have with your health care provider. Document Released: 01/23/2015 Document Revised: 09/16/2015 Document Reviewed: 10/28/2014 Elsevier Interactive Patient Education  2017 [place] Prevention in the Home Falls can cause injuries. They can happen to people of all ages. There are many things you can do to make your home safe and to help prevent falls. What can I do on the outside of my home?  Regularly fix the edges of walkways and driveways and fix any cracks.  Remove anything that might make you trip as you walk through a door, such as a raised step or threshold.  Trim any bushes or trees on the path to your home.  Use bright outdoor lighting.  Clear any walking paths of anything that might make someone trip, such as rocks or tools.  Regularly check to see if handrails are loose or broken. Make sure that both sides of any steps have handrails.  Any raised decks and porches should have guardrails on the edges.  Have any leaves, snow, or ice cleared regularly.  Use sand or salt on walking paths during winter.  Clean up any spills in your garage right away. This includes oil or grease spills. What can I do in the bathroom?  Use night lights.  Install grab bars by the toilet and in the tub and shower. Do not use towel bars as grab bars.  Use non-skid mats or decals in the tub or shower.  If you need to sit down in the shower, use a plastic, non-slip stool.  Keep the floor dry. Clean up any water that spills on the floor as soon as it happens.  Remove soap buildup in the tub or shower regularly.  Attach bath mats securely with double-sided non-slip rug tape.  Do not have throw rugs and other things on the floor that can make you trip. What can I do in  the bedroom?  Use night lights.  Make sure that you have a light by your bed that is easy to reach.  Do not use any sheets or blankets that are too big for your bed. They should not hang down onto the floor.  Have a firm chair that has side arms. You can use this for support while you get dressed.  Do not have throw rugs and other things on the floor that can make you trip. What can I do in the kitchen?  Clean up any spills right away.  Avoid walking on wet floors.  Keep items that you use a lot in easy-to-reach places.  If you need to reach something above you, use a strong step stool that has a grab bar.  Keep electrical cords out of the way.  Do not use floor polish or wax that makes floors slippery. If you must use wax, use non-skid floor wax.  Do not have throw rugs and other things on the floor that can make you trip. What can I do with my stairs?  Do not leave any items on the stairs.  Make sure  that there are handrails on both sides of the stairs and use them. Fix handrails that are broken or loose. Make sure that handrails are as long as the stairways.  Check any carpeting to make sure that it is firmly attached to the stairs. Fix any carpet that is loose or worn.  Avoid having throw rugs at the top or bottom of the stairs. If you do have throw rugs, attach them to the floor with carpet tape.  Make sure that you have a light switch at the top of the stairs and the bottom of the stairs. If you do not have them, ask someone to add them for you. What else can I do to help prevent falls?  Wear shoes that:  Do not have high heels.  Have rubber bottoms.  Are comfortable and fit you well.  Are closed at the toe. Do not wear sandals.  If you use a stepladder:  Make sure that it is fully opened. Do not climb a closed stepladder.  Make sure that both sides of the stepladder are locked into place.  Ask someone to hold it for you, if possible.  Clearly mark and  make sure that you can see:  Any grab bars or handrails.  First and last steps.  Where the edge of each step is.  Use tools that help you move around (mobility aids) if they are needed. These include:  Canes.  Walkers.  Scooters.  Crutches.  Turn on the lights when you go into a dark area. Replace any light bulbs as soon as they burn out.  Set up your furniture so you have a clear path. Avoid moving your furniture around.  If any of your floors are uneven, fix them.  If there are any pets around you, be aware of where they are.  Review your medicines with your doctor. Some medicines can make you feel dizzy. This can increase your chance of falling. Ask your doctor what other things that you can do to help prevent falls. This information is not intended to replace advice given to you by your health care provider. Make sure you discuss any questions you have with your health care provider. Document Released: 10/23/2008 Document Revised: 06/04/2015 Document Reviewed: 01/31/2014 Elsevier Interactive Patient Education  2017 [employer].

## 2016-05-26 ENCOUNTER — Ambulatory Visit: Payer: Medicare Other

## 2016-06-01 ENCOUNTER — Other Ambulatory Visit: Payer: Self-pay | Admitting: *Deleted

## 2016-06-01 MED ORDER — LOSARTAN POTASSIUM 50 MG PO TABS: 50.0000 mg | ORAL_TABLET | Freq: Every day | ORAL | 1 refills | Status: DC

## 2016-06-01 NOTE — Telephone Encounter (Signed)
[facility] 

## 2016-07-06 ENCOUNTER — Other Ambulatory Visit: Payer: Self-pay

## 2016-07-06 DIAGNOSIS — B351 Tinea unguium: Secondary | ICD-10-CM

## 2016-07-06 MED ORDER — TERBINAFINE HCL 250 MG PO TABS: 250.0000 mg | ORAL_TABLET | Freq: Every day | ORAL | 0 refills | Status: DC

## 2016-07-06 MED ORDER — METFORMIN HCL 1000 MG PO TABS: 1000.0000 mg | ORAL_TABLET | Freq: Two times a day (BID) | ORAL | 1 refills | Status: DC

## 2016-07-06 NOTE — Addendum Note (Signed)
Addended by: Denyse Amass on: 07/06/2016 03:35 PM   Modules accepted: Orders

## 2016-07-06 NOTE — Telephone Encounter (Signed)
A refill request was received for metformin 1000 mg tablet. Rx was sent to pharmacy electronically for #[id number] with 1 RF.

## 2016-07-06 NOTE — Telephone Encounter (Signed)
A fax was received from [facility] for lamisil.   Rx was printed and placed in Dr. Cyndi Lennert folder for review and signing.

## 2016-07-14 ENCOUNTER — Other Ambulatory Visit: Payer: Self-pay | Admitting: Internal Medicine

## 2016-07-14 DIAGNOSIS — B351 Tinea unguium: Secondary | ICD-10-CM

## 2016-07-21 ENCOUNTER — Other Ambulatory Visit: Payer: Self-pay | Admitting: *Deleted

## 2016-07-21 MED ORDER — METOPROLOL TARTRATE 100 MG PO TABS: 50.0000 mg | ORAL_TABLET | Freq: Two times a day (BID) | ORAL | 11 refills | Status: DC

## 2016-07-21 NOTE — Telephone Encounter (Signed)
[facility] 

## 2016-07-25 ENCOUNTER — Other Ambulatory Visit: Payer: Self-pay | Admitting: *Deleted

## 2016-07-25 MED ORDER — LOVASTATIN 20 MG PO TABS: 20.0000 mg | ORAL_TABLET | Freq: Every day | ORAL | 1 refills | Status: DC

## 2016-07-25 NOTE — Telephone Encounter (Signed)
Patient requested refill Faxed to pharmacy.  

## 2016-09-15 ENCOUNTER — Other Ambulatory Visit: Payer: Self-pay | Admitting: *Deleted

## 2016-09-15 MED ORDER — LOSARTAN POTASSIUM 50 MG PO TABS: 50.0000 mg | ORAL_TABLET | Freq: Every day | ORAL | 1 refills | Status: DC

## 2016-09-15 NOTE — Telephone Encounter (Signed)
[facility] 

## 2016-09-20 ENCOUNTER — Other Ambulatory Visit: Payer: Medicare Other

## 2016-09-22 ENCOUNTER — Encounter: Payer: Self-pay | Admitting: Internal Medicine

## 2016-09-22 ENCOUNTER — Ambulatory Visit (INDEPENDENT_AMBULATORY_CARE_PROVIDER_SITE_OTHER): Payer: Medicare Other | Admitting: Internal Medicine

## 2016-09-22 VITALS — BP 120/80 | HR 71 | Temp 98.1°F | Wt 138.0 lb

## 2016-09-22 DIAGNOSIS — N183 Chronic kidney disease, stage 3 unspecified: Secondary | ICD-10-CM

## 2016-09-22 DIAGNOSIS — E1169 Type 2 diabetes mellitus with other specified complication: Secondary | ICD-10-CM

## 2016-09-22 DIAGNOSIS — B351 Tinea unguium: Secondary | ICD-10-CM | POA: Diagnosis not present

## 2016-09-22 DIAGNOSIS — E782 Mixed hyperlipidemia: Secondary | ICD-10-CM

## 2016-09-22 DIAGNOSIS — I10 Essential (primary) hypertension: Secondary | ICD-10-CM

## 2016-09-22 DIAGNOSIS — E1143 Type 2 diabetes mellitus with diabetic autonomic (poly)neuropathy: Secondary | ICD-10-CM

## 2016-09-22 DIAGNOSIS — Z23 Encounter for immunization: Secondary | ICD-10-CM | POA: Diagnosis not present

## 2016-09-22 DIAGNOSIS — R350 Frequency of micturition: Secondary | ICD-10-CM | POA: Diagnosis not present

## 2016-09-22 MED ORDER — TERBINAFINE HCL 250 MG PO TABS: 250.0000 mg | ORAL_TABLET | Freq: Every day | ORAL | 0 refills | Status: DC

## 2016-09-22 NOTE — Addendum Note (Signed)
Addended by: Despina Hidden on: 09/22/2016 10:01 AM   Modules accepted: Orders

## 2016-09-22 NOTE — Progress Notes (Signed)
Location:  [facility] clinic Provider:  Italo Banton L. [name], D.O., C.M.D.  Code Status: DNR Goals of Care:  Advanced Directives 09/22/2016  Does Patient Have a Medical Advance Directive? Yes  Type of Paramedic of [place];Living will  Does patient want to make changes to medical advance directive? -  Copy of [place] in Chart? Yes  Would patient like information on creating a medical advance directive? -  Pre-existing out of facility DNR order (yellow form or pink MOST form) -   Chief Complaint  Patient presents with  . Medical Management of Chronic Issues    35mth follow-up    HPI: Patient is a 76 y.o. female seen today for medical management of chronic diseases.    HTN:  bp at goal on current meds.    DMII:  hba1c 5.9 in April.  Has been well controlled.  Hyperlipidemia:  LDL 105.  Goal <70.    Urinary frequency persists--3x at night.  Is drinking a lot of water with her dry mouth from the detrol.  Tried myrbetriq samples and went back to detrol when she was done b/c it was no better.    MMSE 29/30.  At [facility] in May.    Got flu shot already here today.    Fingernail fungus is back on thumbnails.    Past Medical History:  Diagnosis Date  . Benign neoplasm of colon   . Diabetes mellitus without complication (HCC)    W7P currently a goal  . Essential hypertension, benign   . GERD (gastroesophageal reflux disease)   . Hyperlipidemia    Followed by PCP  . Hypertension   . Lumbago   . Migraine, unspecified, without mention of intractable migraine without mention of status migrainosus   . Osteoarthrosis, unspecified whether generalized or localized, unspecified site   . Palpitations    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  . Solitary pulmonary nodule 2012  . Urinary frequency     Past Surgical History:  Procedure Laterality Date  . ABDOMINAL HYSTERECTOMY    . CARDIAC CATHETERIZATION  11/15/1994   Patent coronary arteries and normal LV  function  . CARDIAC CATHETERIZATION  11/03/2003   Normal coronary arteries  . COLONOSCOPY  2010   Dr.Bucinni, Normal   . COLONOSCOPY  2004   Dr.Bucinni  . EXERCISE STRESS TEST  11/12/1987   Positive  . TRANSTHORACIC ECHOCARDIOGRAM  05/19/2010   EF >55%, normal-mild    Allergies  Allergen Reactions  . Azithromycin Hives  . Statins     Myalgias-high doses  . Tramadol Nausea Only  . Gemfibrozil Other (See Comments)    Leg cramps at night    Allergies as of 09/22/2016      Reactions   Azithromycin Hives   Statins    Myalgias-high doses   Tramadol Nausea Only   Gemfibrozil Other (See Comments)   Leg cramps at night      Medication List       Accurate as of 09/22/16  9:16 AM. Always use your most recent med list.          acetaminophen 500 MG tablet Commonly known as:  TYLENOL Take 500 mg by mouth as needed for pain.   amLODipine 10 MG tablet Commonly known as:  NORVASC Take 1 tablet (10 mg total) by mouth daily.   aspirin 325 MG tablet Take 1/2 tablet by mouth daily   conjugated estrogens vaginal cream Commonly known as:  PREMARIN Pea size every 2-3 days  lansoprazole 30 MG capsule Commonly known as:  PREVACID Take 30 mg by mouth daily at 12 noon.   losartan 50 MG tablet Commonly known as:  COZAAR Take 1 tablet (50 mg total) by mouth daily.   lovastatin 20 MG tablet Commonly known as:  MEVACOR Take 1 tablet (20 mg total) by mouth at bedtime.   metFORMIN 1000 MG tablet Commonly known as:  GLUCOPHAGE Take 1 tablet (1,000 mg total) by mouth 2 (two) times daily with a meal.   metoprolol tartrate 100 MG tablet Commonly known as:  LOPRESSOR Take 0.5 tablets (50 mg total) by mouth 2 (two) times daily.   multivitamin tablet Take 1 tablet by mouth daily.   Omega-3 1000 MG Caps Take 1 g by mouth.   terbinafine 250 MG tablet Commonly known as:  LAMISIL Take 1 tablet (250 mg total) by mouth daily.   tolterodine 4 MG 24 hr capsule Commonly known as:   DETROL LA Take 1 capsule (4 mg total) by mouth daily.   Vitamin D3 2000 units Chew Chew 1 capsule by mouth daily.            Discharge Care Instructions        Start     Ordered   09/22/16 0000  terbinafine (LAMISIL) 250 MG tablet  Daily     09/22/16 0841      Review of Systems:  Review of Systems  Constitutional: Negative for chills, fever and malaise/fatigue.  HENT: Negative for congestion.   Eyes: Negative for blurred vision.  Respiratory: Negative for shortness of breath.   Cardiovascular: Negative for chest pain and palpitations.  Gastrointestinal: Negative for abdominal pain, blood in stool, constipation and melena.  Genitourinary: Positive for frequency and urgency. Negative for dysuria.       Atrophic vaginitis  Musculoskeletal: Negative for falls, joint pain and myalgias.  Skin: Negative for itching and rash.       Bilateral nail fungus of thumbs  Neurological: Negative for dizziness, loss of consciousness and weakness.  Endo/Heme/Allergies: Does not bruise/bleed easily.  Psychiatric/Behavioral: Negative for depression and memory loss.    Health Maintenance  Topic Date Due  . OPHTHALMOLOGY EXAM  06/28/2016  . INFLUENZA VACCINE  08/10/2016  . HEMOGLOBIN A1C  11/03/2016  . FOOT EXAM  12/30/2016  . COLONOSCOPY  08/15/2023  . TETANUS/TDAP  09/02/2024  . DEXA SCAN  Completed  . PNA vac Low Risk Adult  Completed    Physical Exam: Vitals:   09/22/16 0838  BP: 120/80  Pulse: 71  Temp: 98.1 F (36.7 C)  TempSrc: Oral  SpO2: 94%  Weight: 138 lb (62.6 kg)   Body mass index is 23.69 kg/m. Physical Exam  Constitutional: She is oriented to person, place, and time. She appears well-developed and well-nourished. No distress.  Cardiovascular: Normal rate, regular rhythm, normal heart sounds and intact distal pulses.   Pulmonary/Chest: Effort normal and breath sounds normal. No respiratory distress.  Abdominal: Bowel sounds are normal.  Musculoskeletal:  Normal range of motion.  Neurological: She is alert and oriented to person, place, and time.  Skin: Skin is warm and dry. Capillary refill takes less than 2 seconds.  Psychiatric: She has a normal mood and affect. Her behavior is normal. Judgment and thought content normal.    Labs reviewed: Basic Metabolic Panel:  Recent Labs  12/28/15 0843 05/04/16 0818  NA 140 138  K 4.4 4.4  CL 104 104  CO2 27 25  GLUCOSE 103* 95  BUN  15 19  CREATININE 1.00* 1.07*  CALCIUM 9.5 9.3   Liver Function Tests:  Recent Labs  03/11/16 0848 05/04/16 0818  AST 12 12  ALT 9 8  ALKPHOS 55 55  BILITOT 0.4 0.4  PROT 7.2 6.9  ALBUMIN 4.3 4.1   No results for input(s): LIPASE, AMYLASE in the last 8760 hours. No results for input(s): AMMONIA in the last 8760 hours. CBC:  Recent Labs  05/04/16 0818  WBC 4.9  NEUTROABS 2,352  HGB 12.6  HCT 39.2  MCV 89.7  PLT 309   Lipid Panel:  Recent Labs  05/04/16 0818  CHOL 176  HDL 42*  LDLCALC 105*  TRIG 143  CHOLHDL 4.2   Lab Results  Component Value Date   HGBA1C 5.9 (H) 05/04/2016    Assessment/Plan 1. Onychomycosis - bilateral fingernails has recurred - terbinafine (LAMISIL) 250 MG tablet; Take 1 tablet (250 mg total) by mouth daily.  Dispense: 30 tablet; Refill: 0  2. Essential hypertension - bp at goal with current therapy, cont same  - CBC with Differential/Platelet; Future - COMPLETE METABOLIC PANEL WITH GFR; Future  3. Diabetic autonomic neuropathy associated with type 2 diabetes mellitus ([place]) - well controlled, cont metformin, asa, losartan - Hemoglobin A1c; Future  4. Mixed hyperlipidemia due to type 2 diabetes mellitus (HCC) - Lipid panel; Future -cont lovastatin  5. CKD (chronic kidney disease) stage 3, GFR 30-59 ml/min - Avoid nephrotoxic agents like nsaids, dose adjust renally excreted meds, hydrate. - COMPLETE METABOLIC PANEL WITH GFR; Future  6. Urinary frequency -seeing urology, detrol LA given by them  and myrbetriq was too expensive and no more effective  7. Need for influenza vaccination -flu shot given  Labs/tests ordered:   Orders Placed This Encounter  Procedures  . CBC with Differential/Platelet    Standing Status:   Future    Standing Expiration Date:   05/22/2017  . COMPLETE METABOLIC PANEL WITH GFR    Standing Status:   Future    Standing Expiration Date:   05/22/2017  . Hemoglobin A1c    Standing Status:   Future    Standing Expiration Date:   05/22/2017  . Lipid panel    Standing Status:   Future    Standing Expiration Date:   05/22/2017    Next appt:  01/23/2017 with labs before  [place]. Marirose Deveney, D.O. [facility] [address], [place] [address] Cell Phone (Mon-Fri 8am-5pm):  [phone] On Call:  [phone] & follow prompts after 5pm & weekends Office Phone:  [phone] Office Fax:  [phone]

## 2016-09-22 NOTE — Patient Instructions (Signed)
Please go to the pharmacy in a week or two to start the shingrix shot series (2 shots 2-6 months apart).

## 2016-09-28 ENCOUNTER — Encounter: Payer: Self-pay | Admitting: Cardiology

## 2016-09-28 ENCOUNTER — Ambulatory Visit (INDEPENDENT_AMBULATORY_CARE_PROVIDER_SITE_OTHER): Payer: Medicare Other | Admitting: Cardiology

## 2016-09-28 VITALS — BP 145/76 | HR 73 | Ht 65.0 in | Wt 140.2 lb

## 2016-09-28 DIAGNOSIS — E782 Mixed hyperlipidemia: Secondary | ICD-10-CM

## 2016-09-28 DIAGNOSIS — Z8679 Personal history of other diseases of the circulatory system: Secondary | ICD-10-CM | POA: Diagnosis not present

## 2016-09-28 DIAGNOSIS — I10 Essential (primary) hypertension: Secondary | ICD-10-CM

## 2016-09-28 NOTE — Progress Notes (Signed)
PCP: Gayland Curry, DO  Clinic Note: Chief Complaint  Patient presents with  . Follow-up    h/o SVT    HPI: Mandy Perry is a 76 y.o. female with a PMH below who presents today for delayed f/u (~ 3 yrs) for intervals of SVT.Marland Kitchen  she ha aortic sclerosis but no  Stenosis by echocardiogram.    Mandy Perry was last seen on 10/14/2013 --  She noted some mild exercise intolerance and fatigue.  Recent Hospitalizations:  none  Studies Personally Reviewed - (if available, images/films reviewed: From Epic Chart or Care Everywhere)   none  Interval History:   Mandy Perry returns today for follow-up  Overall doing fairly well.  Clearly, since she has not seen me  In almost 3 year , she has been doing relatively well. She still notes that she is active all the time, but does get a bit more tired sooner than usual.  She notes nocturia at least 2 times a night she has been tingling.    From a cardiacstandpoint, she denies any recurrent rapid  irregularheartbeats or palpitations. No  Syncope/near syncope or TIA/ amaurosis fugax. No chest pain or shortness of breath with rest or exertion. No PND, orthopnea or edema.  No melena, hematochezia, hematuria, or epstaxis. No claudication. on  ROS: A comprehensive was performed. Review of Systems  Constitutional: Negative for malaise/fatigue.  HENT: Negative for congestion and nosebleeds.   Respiratory: Negative for cough, sputum production, shortness of breath and wheezing.   Genitourinary: Positive for frequency (Nocturia).  Musculoskeletal: Negative for myalgias.  Neurological: Positive for tingling (In feet).  Endo/Heme/Allergies: Negative for environmental allergies. Does not bruise/bleed easily.  Psychiatric/Behavioral: Negative for depression and memory loss. The patient is not nervous/anxious and does not have insomnia.   All other systems reviewed and are negative.   I have reviewed and (if needed) personally updated the patient's problem  list, medications, allergies, past medical and surgical history, social and family history.   Past Medical History:  Diagnosis Date  . Benign neoplasm of colon   . Diabetes mellitus without complication (HCC)    A3F currently a goal  . Essential hypertension, benign   . GERD (gastroesophageal reflux disease)   . Hyperlipidemia    Followed by PCP  . Hypertension   . Lumbago   . Migraine, unspecified, without mention of intractable migraine without mention of status migrainosus   . Osteoarthrosis, unspecified whether generalized or localized, unspecified site   . Palpitations    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  . Solitary pulmonary nodule 2012  . Urinary frequency     Past Surgical History:  Procedure Laterality Date  . ABDOMINAL HYSTERECTOMY    . CARDIAC CATHETERIZATION  11/15/1994   Patent coronary arteries and normal LV function  . CARDIAC CATHETERIZATION  11/03/2003   Normal coronary arteries  . COLONOSCOPY  2010   Dr.Bucinni, Normal   . COLONOSCOPY  2004   Dr.Bucinni  . EXERCISE STRESS TEST  11/12/1987   Positive  . TRANSTHORACIC ECHOCARDIOGRAM  05/19/2010   EF >55%, normal-mild    Current Meds  Medication Sig  . acetaminophen (TYLENOL) 500 MG tablet Take 500 mg by mouth as needed for pain.  Marland Kitchen aspirin 325 MG tablet Take 1/2 tablet by mouth daily  . Cholecalciferol (VITAMIN D3) 2000 units CHEW Chew 1 capsule by mouth daily.  Marland Kitchen conjugated estrogens (PREMARIN) vaginal cream Pea size every 2-3 days  . lansoprazole (PREVACID) 30 MG capsule Take 30 mg  by mouth daily at 12 noon.   Marland Kitchen losartan (COZAAR) 50 MG tablet Take 1 tablet (50 mg total) by mouth daily.  Marland Kitchen lovastatin (MEVACOR) 20 MG tablet Take 1 tablet (20 mg total) by mouth at bedtime.  . metFORMIN (GLUCOPHAGE) 1000 MG tablet Take 1 tablet (1,000 mg total) by mouth 2 (two) times daily with a meal.  . metoprolol tartrate (LOPRESSOR) 100 MG tablet Take 0.5 tablets (50 mg total) by mouth 2 (two) times daily.  . Multiple  Vitamin (MULTIVITAMIN) tablet Take 1 tablet by mouth daily.   . Omega-3 1000 MG CAPS Take 1 g by mouth.  . terbinafine (LAMISIL) 250 MG tablet Take 1 tablet (250 mg total) by mouth daily.  Marland Kitchen tolterodine (DETROL LA) 4 MG 24 hr capsule Take 1 capsule (4 mg total) by mouth daily.  . [DISCONTINUED] amLODipine (NORVASC) 10 MG tablet Take 1 tablet (10 mg total) by mouth daily.    Allergies  Allergen Reactions  . Azithromycin Hives  . Statins     Myalgias-high doses  . Tramadol Nausea Only  . Gemfibrozil Other (See Comments)    Leg cramps at night    Social History   Social History  . Marital status: Widowed    Spouse name: N/A  . Number of children: N/A  . Years of education: N/A   Occupational History  . retired Marine scientist    Social History Main Topics  . Smoking status: Former Smoker    Packs/day: 0.50    Years: 20.00    Types: Cigarettes    Quit date: 01/10/1977  . Smokeless tobacco: Never Used     Comment: Quit at age 11  . Alcohol use No  . Drug use: No  . Sexual activity: Not Currently   Other Topics Concern  . 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    She had 10 years a history of about a half pack a day but quit 10 years ago.    Her exercising is limited by her osteoarthritis pains. But she tries to do some of the exercises with walking on a daily basis.   Alcohol none    family history includes COPD in her brother; CVA in her mother; Cancer in her brother and father; Colon cancer in her sister; Diabetes in her sister and sister; Heart attack in her father.  Wt Readings from Last 3 Encounters:  09/28/16 140 lb 3.2 oz (63.6 kg)  09/22/16 138 lb (62.6 kg)  05/25/16 137 lb (62.1 kg)    PHYSICAL EXAM BP (!) 145/76   Pulse 73   Ht 5\' 5"  (1.651 m)   Wt 140 lb 3.2 oz (63.6 kg)   BMI 23.33 kg/m  Physical Exam  Constitutional: She is oriented to person, place, and time. She  appears well-developed and well-nourished. No distress.  Healthy-appearing. Well groomed  HENT:  Head: Normocephalic and atraumatic.  Eyes: EOM are normal.  Neck: Normal range of motion. Neck supple. No hepatojugular reflux and no JVD present. Carotid bruit is not present. No thyromegaly present.  Cardiovascular: Normal rate, regular rhythm and intact distal pulses.  PMI is not displaced.  Exam reveals no gallop, no distant heart sounds, no friction rub and no midsystolic click.   Murmur heard.  Medium-pitched harsh crescendo-decrescendo early systolic murmur is present with a grade of 1/6  at the upper right sternal  border radiating to the neck Pulmonary/Chest: Effort normal and breath sounds normal. No respiratory distress. She has no wheezes.  Abdominal: Soft. Bowel sounds are normal. She exhibits no distension. There is no tenderness. There is no rebound.  Musculoskeletal: Normal range of motion. She exhibits no edema.  Neurological: She is alert and oriented to person, place, and time.  Skin: Skin is warm and dry. No rash noted. No erythema.  Psychiatric: She has a normal mood and affect. Her behavior is normal. Judgment and thought content normal.  Nursing note and vitals reviewed.    Adult ECG Report  Rate: 73 ;  Rhythm: normal sinus rhythm and Nonspecific ST and T wave changes. Also criteria for LVH.;   Narrative Interpretation:  Stable EKG   Other studies Reviewed: Additional studies/ records that were reviewed today include:  Recent Labs:   Lab Results  Component Value Date   CHOL 176 05/04/2016   HDL 42 (L) 05/04/2016   LDLCALC 105 (H) 05/04/2016   TRIG 143 05/04/2016   CHOLHDL 4.2 05/04/2016   Lab Results  Component Value Date   CREATININE 1.07 (H) 05/04/2016   BUN 19 05/04/2016   NA 138 05/04/2016   K 4.4 05/04/2016   CL 104 05/04/2016   CO2 25 05/04/2016   ASSESSMENT / PLAN: Problem List Items Addressed This Visit    Essential hypertension (Chronic)    Relevant Orders   EKG 12-Lead (Completed)   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 She is on standing dose of metoprolol  We discussed vagal maneuvers.      Hypercholesterolemia with hypertriglyceridemia (Chronic)    Larger by PCP. Currently on Zerit epos omega-3 fatty acids. The LDL is close to 100. Probably okay given her risk factors.         Current medicines are reviewed at length with the patient today. (+/- concerns) n/a The following changes have been made: n/a  Patient Instructions  NO CHANGE WITH CURRENT MEDICATIONS   Your physician wants you to follow-up in [place]. You will receive a reminder letter in the mail two months in advance. If you don't receive a letter, please call our office to schedule the follow-up appointment.         Studies Ordered:   Orders Placed This Encounter  Procedures  . EKG 12-Lead      Glenetta Hew, M.D., M.S. Interventional Cardiologist   Pager # [phone] Phone # [phone] [address] [place], [place] [address]

## 2016-09-28 NOTE — Patient Instructions (Addendum)
NO CHANGE WITH CURRENT MEDICATIONS      Your physician wants you to follow-up in 24 MONTHS WITH DR HARDING. You will receive a reminder letter in the mail two months in advance. If you don't receive a letter, please call our office to schedule the follow-up appointment.  

## 2016-09-29 ENCOUNTER — Other Ambulatory Visit: Payer: Self-pay | Admitting: *Deleted

## 2016-09-29 MED ORDER — AMLODIPINE BESYLATE 10 MG PO TABS: 10.0000 mg | ORAL_TABLET | Freq: Every day | ORAL | 1 refills | Status: DC

## 2016-09-29 NOTE — Telephone Encounter (Signed)
[place]

## 2016-10-01 ENCOUNTER — Encounter: Payer: Self-pay | Admitting: Cardiology

## 2016-10-01 NOTE — Assessment & Plan Note (Signed)
It would sound like she has not had any recurrent episodes of PSVTsince I last saw her 3 years ago. She is on standing dose of metoprolol  We discussed vagal maneuvers.

## 2016-10-01 NOTE — Assessment & Plan Note (Signed)
Larger by PCP. Currently on Zerit epos omega-3 fatty acids. The LDL is close to 100. Probably okay given her risk factors.

## 2016-10-06 ENCOUNTER — Ambulatory Visit: Payer: Medicare Other | Admitting: Cardiology

## 2016-10-17 ENCOUNTER — Other Ambulatory Visit: Payer: Self-pay | Admitting: *Deleted

## 2016-10-17 MED ORDER — LOVASTATIN 20 MG PO TABS: 20.0000 mg | ORAL_TABLET | Freq: Every day | ORAL | 1 refills | Status: DC

## 2016-10-17 NOTE — Telephone Encounter (Signed)
[facility] 

## 2016-10-27 ENCOUNTER — Other Ambulatory Visit: Payer: Self-pay | Admitting: *Deleted

## 2016-10-27 DIAGNOSIS — B351 Tinea unguium: Secondary | ICD-10-CM

## 2016-10-27 MED ORDER — TERBINAFINE HCL 250 MG PO TABS: 250.0000 mg | ORAL_TABLET | Freq: Every day | ORAL | 0 refills | Status: DC

## 2016-10-27 NOTE — Telephone Encounter (Signed)
rx sent to pharmacy by e-script  

## 2016-11-15 LAB — HM COLONOSCOPY

## 2016-12-07 ENCOUNTER — Encounter: Payer: Self-pay | Admitting: Nurse Practitioner

## 2016-12-07 ENCOUNTER — Ambulatory Visit: Payer: Medicare Other | Admitting: Nurse Practitioner

## 2016-12-07 VITALS — BP 124/78 | HR 74 | Temp 98.6°F | Resp 18 | Wt 141.0 lb

## 2016-12-07 DIAGNOSIS — R300 Dysuria: Secondary | ICD-10-CM

## 2016-12-07 DIAGNOSIS — N3281 Overactive bladder: Secondary | ICD-10-CM | POA: Diagnosis not present

## 2016-12-07 DIAGNOSIS — B351 Tinea unguium: Secondary | ICD-10-CM | POA: Diagnosis not present

## 2016-12-07 DIAGNOSIS — I10 Essential (primary) hypertension: Secondary | ICD-10-CM | POA: Diagnosis not present

## 2016-12-07 LAB — COMPLETE METABOLIC PANEL WITH GFR
AG RATIO: 1.6 (calc) (ref 1.0–2.5)
ALBUMIN MSPROF: 4.3 g/dL (ref 3.6–5.1)
ALT: 12 U/L (ref 6–29)
AST: 13 U/L (ref 10–35)
Alkaline phosphatase (APISO): 62 U/L (ref 33–130)
BUN/Creatinine Ratio: 17 (calc) (ref 6–22)
BUN: 19 mg/dL (ref 7–25)
CALCIUM: 9.6 mg/dL (ref 8.6–10.4)
CO2: 25 mmol/L (ref 20–32)
Chloride: 103 mmol/L (ref 98–110)
Creat: 1.11 mg/dL — ABNORMAL HIGH (ref 0.60–0.93)
GFR, EST AFRICAN AMERICAN: 56 mL/min/{1.73_m2} — AB (ref >=60)
GFR, EST NON AFRICAN AMERICAN: 49 mL/min/{1.73_m2} — AB (ref >=60)
GLOBULIN: 2.7 g/dL (ref 1.9–3.7)
Glucose, Bld: 142 mg/dL — ABNORMAL HIGH (ref 65–139)
POTASSIUM: 4.2 mmol/L (ref 3.5–5.3)
SODIUM: 136 mmol/L (ref 135–146)
Total Bilirubin: 0.3 mg/dL (ref 0.2–1.2)
Total Protein: 7 g/dL (ref 6.1–8.1)

## 2016-12-07 LAB — POCT URINALYSIS DIPSTICK
Bilirubin, UA: NEGATIVE
Blood, UA: NEGATIVE
Glucose, UA: NEGATIVE
KETONES UA: NEGATIVE
Nitrite, UA: NEGATIVE
PH UA: 5 (ref 5.0–8.0)
PROTEIN UA: NEGATIVE
SPEC GRAV UA: 1.015 (ref 1.010–1.025)
UROBILINOGEN UA: NEGATIVE U/dL — AB

## 2016-12-07 MED ORDER — TERBINAFINE HCL 250 MG PO TABS: 250.0000 mg | ORAL_TABLET | Freq: Every day | ORAL | 0 refills | Status: DC

## 2016-12-07 MED ORDER — AMLODIPINE BESYLATE 5 MG PO TABS: 5.0000 mg | ORAL_TABLET | Freq: Every day | ORAL | 3 refills | Status: DC

## 2016-12-07 MED ORDER — MIRABEGRON ER 25 MG PO TB24: 25.0000 mg | ORAL_TABLET | Freq: Every day | ORAL | 3 refills | Status: DC

## 2016-12-07 NOTE — Progress Notes (Signed)
Careteam: Patient Care Team: Gayland Curry, DO as PCP - General (Geriatric Medicine) Leonie Man, MD as Consulting Physician (Cardiology)  Advanced Directive information Does Patient Have a Medical Advance Directive?: Yes, Type of Advance Directive: [place];Living will  Allergies  Allergen Reactions  . Azithromycin Hives  . Statins     Myalgias-high doses  . Tramadol Nausea Only  . Gemfibrozil Other (See Comments)    Leg cramps at night    Chief Complaint  Patient presents with  . Acute Visit    Pt is being seen due to urinary frequency and urgency, burning with urination, and dark color to urine.   . Medication Refill    Rx pended for lamisil     HPI: Patient is a 76 y.o. female seen in the office today due to urinary frequency, urgency and stinging for the last several days.  Reports she is frequently drinking water.  Suprapubic pressure.  Feels the urge to go all the time but then can not go.  No fevers or chills.  No new back pain.   Tried myrebriq but only for a few weeks.  Review of Systems:  Review of Systems  Constitutional: Negative for chills, fever and malaise/fatigue.  HENT: Negative for congestion.   Respiratory: Negative for shortness of breath.   Cardiovascular: Negative for chest pain and palpitations.  Gastrointestinal: Negative for abdominal pain, blood in stool, constipation and melena.  Genitourinary: Positive for frequency and urgency. Negative for dysuria.       Atrophic vaginitis  Musculoskeletal: Negative for falls, joint pain and myalgias.  Skin: Negative for itching and rash.       Bilateral nail fungus of thumbs  Neurological: Negative for dizziness, loss of consciousness and weakness.  Endo/Heme/Allergies: Does not bruise/bleed easily.  Psychiatric/Behavioral: Negative for depression and memory loss.    Past Medical History:  Diagnosis Date  . Benign neoplasm of colon   . Diabetes mellitus without  complication (HCC)    C4U currently a goal  . Essential hypertension, benign   . GERD (gastroesophageal reflux disease)   . Hyperlipidemia    Followed by PCP  . Hypertension   . Lumbago   . Migraine, unspecified, without mention of intractable migraine without mention of status migrainosus   . Osteoarthrosis, unspecified whether generalized or localized, unspecified site   . Palpitations    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  . Solitary pulmonary nodule 2012  . Urinary frequency    Past Surgical History:  Procedure Laterality Date  . ABDOMINAL HYSTERECTOMY    . CARDIAC CATHETERIZATION  11/15/1994   Patent coronary arteries and normal LV function  . CARDIAC CATHETERIZATION  11/03/2003   Normal coronary arteries  . COLONOSCOPY  2010   Dr.Bucinni, Normal   . COLONOSCOPY  2004   Dr.Bucinni  . EXERCISE STRESS TEST  11/12/1987   Positive  . TRANSTHORACIC ECHOCARDIOGRAM  05/19/2010   EF >55%, normal-mild   Social History:   reports that she quit smoking about 39 years ago. Her smoking use included cigarettes. She has a 10.00 pack-year smoking history. she has never used smokeless tobacco. She reports that she does not drink alcohol or use drugs.  Family History  Problem Relation Age of Onset  . CVA Mother   . Heart attack Father   . Cancer Father        Prostate cancer  . COPD Brother   . Diabetes Sister   . Colon cancer Sister   .  Diabetes Sister   . Cancer Brother     Medications:   Medication List        Accurate as of 12/07/16 10:23 AM. Always use your most recent med list.          acetaminophen 500 MG tablet Commonly known as:  TYLENOL   amLODipine 10 MG tablet Commonly known as:  NORVASC Take 1 tablet (10 mg total) by mouth daily.   aspirin 325 MG tablet   conjugated estrogens vaginal cream Commonly known as:  PREMARIN   lansoprazole 30 MG capsule Commonly known as:  PREVACID   losartan 50 MG tablet Commonly known as:  COZAAR Take 1 tablet (50 mg  total) by mouth daily.   lovastatin 20 MG tablet Commonly known as:  MEVACOR Take 1 tablet (20 mg total) by mouth at bedtime.   metFORMIN 1000 MG tablet Commonly known as:  GLUCOPHAGE Take 1 tablet (1,000 mg total) by mouth 2 (two) times daily with a meal.   metoprolol tartrate 100 MG tablet Commonly known as:  LOPRESSOR Take 0.5 tablets (50 mg total) by mouth 2 (two) times daily.   multivitamin tablet   Omega-3 1000 MG Caps   terbinafine 250 MG tablet Commonly known as:  LAMISIL Take 1 tablet (250 mg total) by mouth daily.   tolterodine 4 MG 24 hr capsule Commonly known as:  DETROL LA Take 1 capsule (4 mg total) by mouth daily.   Vitamin D3 2000 units Chew Chew 1 capsule by mouth daily.        Physical Exam:  Vitals:   12/07/16 1019  BP: 124/78  Pulse: 74  Resp: 18  Temp: 98.6 F (37 C)  TempSrc: Oral  SpO2: 97%  Weight: 141 lb (64 kg)   Body mass index is 23.46 kg/m.  Physical Exam  Constitutional: She is oriented to person, place, and time. She appears well-developed and well-nourished. No distress.  Cardiovascular: Normal rate, regular rhythm and normal heart sounds.  Pulmonary/Chest: Effort normal and breath sounds normal. No respiratory distress.  Abdominal: Soft. Bowel sounds are normal. She exhibits no distension. There is no tenderness.  Neurological: She is alert and oriented to person, place, and time.  Skin: Skin is warm and dry.  Psychiatric: She has a normal mood and affect. Her behavior is normal. Judgment and thought content normal.    Labs reviewed: Basic Metabolic Panel: Recent Labs    12/28/15 0843 05/04/16 0818  NA 140 138  K 4.4 4.4  CL 104 104  CO2 27 25  GLUCOSE 103* 95  BUN 15 19  CREATININE 1.00* 1.07*  CALCIUM 9.5 9.3   Liver Function Tests: Recent Labs    03/11/16 0848 05/04/16 0818  AST 12 12  ALT 9 8  ALKPHOS 55 55  BILITOT 0.4 0.4  PROT 7.2 6.9  ALBUMIN 4.3 4.1   No results for input(s): LIPASE,  AMYLASE in the last 8760 hours. No results for input(s): AMMONIA in the last 8760 hours. CBC: Recent Labs    05/04/16 0818  WBC 4.9  NEUTROABS 2,352  HGB 12.6  HCT 39.2  MCV 89.7  PLT 309   Lipid Panel: Recent Labs    05/04/16 0818  CHOL 176  HDL 42*  LDLCALC 105*  TRIG 143  CHOLHDL 4.2   TSH: No results for input(s): TSH in the last 8760 hours. A1C: Lab Results  Component Value Date   HGBA1C 5.9 (H) 05/04/2016     Assessment/Plan 1. Onychomycosis -completed  2 months of lamisil - terbinafine (LAMISIL) 250 MG tablet; Take 1 tablet (250 mg total) by mouth daily.  Dispense: 30 tablet; Refill: 0 - CMP with eGFR  2. Essential hypertension -dizziness noted on novasc 10 mg daily therefore she decreased to 5 mg and symptoms improved. Blood pressure stable at this time, to cont norvasc 5 mg daily  - amLODipine (NORVASC) 5 MG tablet; Take 1 tablet (5 mg total) by mouth daily.  Dispense: 90 tablet; Refill: 3  3. Dysuria -encouraged to increase hydration with water -conts on premarin cream - POCT urinalysis dipstick abnormal therefore will send for Culture, Urine; - Culture, Urine  4. Overactive bladder -to stop detrol- reports dry mouth -has tried myrbetriq in the past but only for a week or 2, willing to try again for longer duration to see if this helps -samples provided for 1 month supply. - mirabegron ER (MYRBETRIQ) 25 MG TB24 tablet; Take 1 tablet (25 mg total) by mouth daily.  Dispense: 30 tablet; Refill: 3   Next appt: as scheduled, sooner if needed  Lummie Montijo K. Harle Battiest  [facility] [phone] 8 am - 5 pm) [phone] (after hours)

## 2016-12-07 NOTE — Patient Instructions (Addendum)
We will send your urine off for culture Cont to increase hydration Make sure you are using your premarin cream as well  This is your last refill for the Lamisil then treatment is complete  To stop DETROL and start using myrbetriq 25 mg by mouth daily for overactive bladder- try this at least a month

## 2016-12-08 LAB — URINE CULTURE
MICRO NUMBER: 81336296
SPECIMEN QUALITY:: ADEQUATE

## 2017-01-18 ENCOUNTER — Other Ambulatory Visit: Payer: Self-pay | Admitting: *Deleted

## 2017-01-18 MED ORDER — METFORMIN HCL 1000 MG PO TABS: 1000.0000 mg | ORAL_TABLET | Freq: Two times a day (BID) | ORAL | 1 refills | Status: DC

## 2017-01-18 NOTE — Telephone Encounter (Signed)
[facility] 

## 2017-01-19 ENCOUNTER — Other Ambulatory Visit: Payer: Medicare Other

## 2017-01-19 DIAGNOSIS — I10 Essential (primary) hypertension: Secondary | ICD-10-CM

## 2017-01-19 DIAGNOSIS — E782 Mixed hyperlipidemia: Principal | ICD-10-CM

## 2017-01-19 DIAGNOSIS — N183 Chronic kidney disease, stage 3 unspecified: Secondary | ICD-10-CM

## 2017-01-19 DIAGNOSIS — E1143 Type 2 diabetes mellitus with diabetic autonomic (poly)neuropathy: Secondary | ICD-10-CM

## 2017-01-19 DIAGNOSIS — E1169 Type 2 diabetes mellitus with other specified complication: Secondary | ICD-10-CM

## 2017-01-20 LAB — COMPLETE METABOLIC PANEL WITH GFR
AG Ratio: 1.6 (calc) (ref 1.0–2.5)
ALT: 12 U/L (ref 6–29)
AST: 13 U/L (ref 10–35)
Albumin: 4.5 g/dL (ref 3.6–5.1)
Alkaline phosphatase (APISO): 65 U/L (ref 33–130)
BUN/Creatinine Ratio: 16 (calc) (ref 6–22)
BUN: 16 mg/dL (ref 7–25)
CO2: 28 mmol/L (ref 20–32)
Calcium: 9.7 mg/dL (ref 8.6–10.4)
Chloride: 103 mmol/L (ref 98–110)
Creat: 1.02 mg/dL — ABNORMAL HIGH (ref 0.60–0.93)
GFR, Est African American: 62 mL/min/{1.73_m2} (ref >=60)
GFR, Est Non African American: 53 mL/min/{1.73_m2} — ABNORMAL LOW (ref >=60)
Globulin: 2.9 g/dL (calc) (ref 1.9–3.7)
Glucose, Bld: 119 mg/dL — ABNORMAL HIGH (ref 65–99)
Potassium: 4.2 mmol/L (ref 3.5–5.3)
Sodium: 140 mmol/L (ref 135–146)
Total Bilirubin: 0.3 mg/dL (ref 0.2–1.2)
Total Protein: 7.4 g/dL (ref 6.1–8.1)

## 2017-01-20 LAB — CBC WITH DIFFERENTIAL/PLATELET
Basophils Absolute: 41 cells/uL (ref 0–200)
Basophils Relative: 0.8 %
Eosinophils Absolute: 128 cells/uL (ref 15–500)
Eosinophils Relative: 2.5 %
HCT: 38.8 % (ref 35.0–45.0)
Hemoglobin: 13.2 g/dL (ref 11.7–15.5)
Lymphs Abs: 1989 cells/uL (ref 850–3900)
MCH: 30.3 pg (ref 27.0–33.0)
MCHC: 34 g/dL (ref 32.0–36.0)
MCV: 89.2 fL (ref 80.0–100.0)
MPV: 9.9 fL (ref 7.5–12.5)
Monocytes Relative: 8.2 %
Neutro Abs: 2525 cells/uL (ref 1500–7800)
Neutrophils Relative %: 49.5 %
Platelets: 305 10*3/uL (ref 140–400)
RBC: 4.35 10*6/uL (ref 3.80–5.10)
RDW: 13.2 % (ref 11.0–15.0)
Total Lymphocyte: 39 %
WBC mixed population: 418 cells/uL (ref 200–950)
WBC: 5.1 10*3/uL (ref 3.8–10.8)

## 2017-01-20 LAB — HEMOGLOBIN A1C
Hgb A1c MFr Bld: 6.1 % of total Hgb — ABNORMAL HIGH (ref <5.7)
Mean Plasma Glucose: 128 (calc)
eAG (mmol/L): 7.1 (calc)

## 2017-01-20 LAB — LIPID PANEL
Cholesterol: 149 mg/dL (ref <200)
HDL: 47 mg/dL — ABNORMAL LOW (ref >50)
LDL Cholesterol (Calc): 72 mg/dL (calc)
Non-HDL Cholesterol (Calc): 102 mg/dL (calc) (ref <130)
Total CHOL/HDL Ratio: 3.2 (calc) (ref <5.0)
Triglycerides: 201 mg/dL — ABNORMAL HIGH (ref <150)

## 2017-01-23 ENCOUNTER — Ambulatory Visit: Payer: Medicare Other | Admitting: Internal Medicine

## 2017-01-23 ENCOUNTER — Encounter: Payer: Self-pay | Admitting: Internal Medicine

## 2017-01-23 VITALS — BP 136/70 | HR 87 | Temp 98.5°F | Wt 142.0 lb

## 2017-01-23 DIAGNOSIS — N3281 Overactive bladder: Secondary | ICD-10-CM

## 2017-01-23 DIAGNOSIS — N183 Chronic kidney disease, stage 3 unspecified: Secondary | ICD-10-CM

## 2017-01-23 DIAGNOSIS — E1143 Type 2 diabetes mellitus with diabetic autonomic (poly)neuropathy: Secondary | ICD-10-CM

## 2017-01-23 DIAGNOSIS — E1169 Type 2 diabetes mellitus with other specified complication: Secondary | ICD-10-CM

## 2017-01-23 DIAGNOSIS — M1711 Unilateral primary osteoarthritis, right knee: Secondary | ICD-10-CM

## 2017-01-23 DIAGNOSIS — K2100 Gastro-esophageal reflux disease with esophagitis, without bleeding: Secondary | ICD-10-CM

## 2017-01-23 DIAGNOSIS — I10 Essential (primary) hypertension: Secondary | ICD-10-CM | POA: Diagnosis not present

## 2017-01-23 DIAGNOSIS — E782 Mixed hyperlipidemia: Secondary | ICD-10-CM | POA: Diagnosis not present

## 2017-01-23 MED ORDER — AMLODIPINE BESYLATE 2.5 MG PO TABS: 2.5000 mg | ORAL_TABLET | Freq: Every day | ORAL | 3 refills | Status: DC

## 2017-01-23 MED ORDER — MIRABEGRON ER 25 MG PO TB24: 25.0000 mg | ORAL_TABLET | Freq: Every day | ORAL | 3 refills | Status: AC

## 2017-01-23 NOTE — Progress Notes (Signed)
Location:  [facility] clinic Provider:  Jabier Deese L. [name], D.O., C.M.D.  Code Status: FULL CODE--need to discuss at next appt Goals of Care:  Advanced Directives 01/23/2017  Does Patient Have a Medical Advance Directive? Yes  Type of Advance Directive Living will  Does patient want to make changes to medical advance directive? No - Patient declined  Copy of [place] in Chart? -  Would patient like information on creating a medical advance directive? No - Patient declined  Pre-existing out of facility DNR order (yellow form or pink MOST form) -     Chief Complaint  Patient presents with  . Medical Management of Chronic Issues    47mth followup, right ache and pain, discuss norvasc    HPI: Patient is a 77 y.o. female seen today for medical management of chronic diseases.    She says she is doing great except some knee pain on the right.  $130 for voltaren gel so didn't get.  Says she does not always need anything so she might get it anyway b/c it will last her a long time.  Myrbetriq is helping her OAB. Is about the same effectiveness as her ditropan.  Still has dry mouth.    Hyperlipidemia:  She's been wanting a cookie lately after meals which explains her elevation of triglycerides.   Pt wants to stop her norvasc because her blood pressure is in the normal range.  Explained that it is b/c she is taking it.    CKD3:  No recent changes.  Avoiding otc nsaids.    GERD:    On prevacid daily.  Continues to say her "stomach bothers her", but seems to be in her lower abdomen not stomach  Past Medical History:  Diagnosis Date  . Benign neoplasm of colon   . Diabetes mellitus without complication (HCC)    N5A currently a goal  . Essential hypertension, benign   . GERD (gastroesophageal reflux disease)   . Hyperlipidemia    Followed by PCP  . Hypertension   . Lumbago   . Migraine, unspecified, without mention of intractable migraine without mention of status  migrainosus   . Osteoarthrosis, unspecified whether generalized or localized, unspecified site   . Palpitations    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  . Solitary pulmonary nodule 2012  . Urinary frequency     Past Surgical History:  Procedure Laterality Date  . ABDOMINAL HYSTERECTOMY    . CARDIAC CATHETERIZATION  11/15/1994   Patent coronary arteries and normal LV function  . CARDIAC CATHETERIZATION  11/03/2003   Normal coronary arteries  . COLONOSCOPY  2010   Dr.Bucinni, Normal   . COLONOSCOPY  2004   Dr.Bucinni  . EXERCISE STRESS TEST  11/12/1987   Positive  . TRANSTHORACIC ECHOCARDIOGRAM  05/19/2010   EF >55%, normal-mild    Allergies  Allergen Reactions  . Azithromycin Hives  . Statins     Myalgias-high doses  . Tramadol Nausea Only  . Gemfibrozil Other (See Comments)    Leg cramps at night    Outpatient Encounter Medications as of 01/23/2017  Medication Sig  . acetaminophen (TYLENOL) 500 MG tablet Take 500 mg by mouth as needed for pain.  Marland Kitchen amLODipine (NORVASC) 5 MG tablet Take 1 tablet (5 mg total) by mouth daily.  Marland Kitchen aspirin 325 MG tablet Take 1/2 tablet by mouth daily  . Cholecalciferol (VITAMIN D3) 2000 units CHEW Chew 1 capsule by mouth daily.  Marland Kitchen conjugated estrogens (PREMARIN) vaginal cream Pea size  every 2-3 days  . lansoprazole (PREVACID) 30 MG capsule Take 30 mg by mouth daily at 12 noon.   Marland Kitchen losartan (COZAAR) 50 MG tablet Take 1 tablet (50 mg total) by mouth daily.  Marland Kitchen lovastatin (MEVACOR) 20 MG tablet Take 1 tablet (20 mg total) by mouth at bedtime.  . metFORMIN (GLUCOPHAGE) 1000 MG tablet Take 1 tablet (1,000 mg total) by mouth 2 (two) times daily with a meal.  . metoprolol tartrate (LOPRESSOR) 100 MG tablet Take 0.5 tablets (50 mg total) by mouth 2 (two) times daily.  . mirabegron ER (MYRBETRIQ) 25 MG TB24 tablet Take 1 tablet (25 mg total) by mouth daily.  . Multiple Vitamin (MULTIVITAMIN) tablet Take 1 tablet by mouth daily.   . Omega-3 1000 MG CAPS Take 1 g  by mouth.  . [DISCONTINUED] terbinafine (LAMISIL) 250 MG tablet Take 1 tablet (250 mg total) by mouth daily.   No facility-administered encounter medications on file as of 01/23/2017.     Review of Systems:  Review of Systems  Constitutional: Negative for chills, fever and malaise/fatigue.  HENT:       Dry mouth persists  Eyes: Negative for blurred vision.  Respiratory: Negative for shortness of breath.   Cardiovascular: Negative for chest pain, palpitations and leg swelling.  Gastrointestinal: Positive for abdominal pain and heartburn. Negative for blood in stool, constipation and melena.  Genitourinary: Positive for frequency and urgency. Negative for dysuria.       Improved with myrbetriq  Musculoskeletal: Positive for joint pain. Negative for falls.  Skin: Negative for itching and rash.  Neurological: Negative for dizziness, loss of consciousness and weakness.  Psychiatric/Behavioral: Negative for depression.    Health Maintenance  Topic Date Due  . OPHTHALMOLOGY EXAM  06/28/2016  . FOOT EXAM  12/30/2016  . HEMOGLOBIN A1C  07/19/2017  . TETANUS/TDAP  09/02/2024  . INFLUENZA VACCINE  Completed  . DEXA SCAN  Completed  . PNA vac Low Risk Adult  Completed    Physical Exam: Vitals:   01/23/17 1049  BP: 136/70  Pulse: 87  Temp: 98.5 F (36.9 C)  TempSrc: Oral  SpO2: 97%  Weight: 142 lb (64.4 kg)   Body mass index is 23.63 kg/m. Physical Exam  Constitutional: She is oriented to person, place, and time. She appears well-developed and well-nourished. No distress.  Cardiovascular: Normal rate, regular rhythm, normal heart sounds and intact distal pulses.  Pulmonary/Chest: Effort normal and breath sounds normal. No respiratory distress.  Abdominal: Soft. Bowel sounds are normal. She exhibits no distension. There is no tenderness.  Musculoskeletal: Normal range of motion. She exhibits tenderness.  Right medial knee  Neurological: She is alert and oriented to person,  place, and time.  Skin: Skin is warm and dry.  Psychiatric: She has a normal mood and affect.    Labs reviewed: Basic Metabolic Panel: Recent Labs    05/04/16 0818 12/07/16 1045 01/19/17 0806  NA 138 136 140  K 4.4 4.2 4.2  CL 104 103 103  CO2 25 25 28   GLUCOSE 95 142* 119*  BUN 19 19 16   CREATININE 1.07* 1.11* 1.02*  CALCIUM 9.3 9.6 9.7   Liver Function Tests: Recent Labs    03/11/16 0848 05/04/16 0818 12/07/16 1045 01/19/17 0806  AST 12 12 13 13   ALT 9 8 12 12   ALKPHOS 55 55  --   --   BILITOT 0.4 0.4 0.3 0.3  PROT 7.2 6.9 7.0 7.4  ALBUMIN 4.3 4.1  --   --  No results for input(s): LIPASE, AMYLASE in the last 8760 hours. No results for input(s): AMMONIA in the last 8760 hours. CBC: Recent Labs    05/04/16 0818 01/19/17 0806  WBC 4.9 5.1  NEUTROABS 2,352 2,525  HGB 12.6 13.2  HCT 39.2 38.8  MCV 89.7 89.2  PLT 309 305   Lipid Panel: Recent Labs    05/04/16 0818 01/19/17 0806  CHOL 176 149  HDL 42* 47*  LDLCALC 105*  --   TRIG 143 201*  CHOLHDL 4.2 3.2   Lab Results  Component Value Date   HGBA1C 6.1 (H) 01/19/2017    Assessment/Plan 1. Diabetic autonomic neuropathy associated with type 2 diabetes mellitus ([place]) -intact with monofilament today -control excellent for a long time now, cont dietary changes and metformin, losartan, full asa, fish oil - Basic metabolic panel; Future - Lipid panel; Future - Hemoglobin A1c; Future  2. Gastroesophageal reflux disease with esophagitis -stable, cont prevacid   3. Essential hypertension - cont lopressor, losartan and norvasc low dose--renewed - amLODipine (NORVASC) 2.5 MG tablet; Take 1 tablet (2.5 mg total) by mouth at bedtime.  Dispense: 90 tablet; Refill: 3 - Basic metabolic panel; Future  4. Mixed hyperlipidemia due to type 2 diabetes mellitus (HCC) -TG trended up with recent addition of cookie after meals - Lipid panel; Future  5. CKD (chronic kidney disease) stage 3, GFR 30-59 ml/min  (HCC) -stable, Avoid nephrotoxic agents like nsaids, dose adjust renally excreted meds, hydrate. -on losartan  6. Overactive bladder - mirabegron ER (MYRBETRIQ) 25 MG TB24 tablet; Take 1 tablet (25 mg total) by mouth daily.  Dispense: 30 tablet; Refill: 3 -clearer mentally off ditropan, but still says she has dry mouth  7. Primary osteoarthritis of right knee -recommended she does fill the voltaren b/c she does not need to use it daily b/c symptoms are only some nights--oddly, I don't see it on her list as I finish her note  Labs/tests ordered:   Orders Placed This Encounter  Procedures  . Basic metabolic panel    Standing Status:   Future    Standing Expiration Date:   09/23/2017    Order Specific Question:   Has the patient fasted?    Answer:   Yes  . Lipid panel    Standing Status:   Future    Standing Expiration Date:   09/23/2017    Order Specific Question:   Has the patient fasted?    Answer:   Yes  . Hemoglobin A1c    Standing Status:   Future    Standing Expiration Date:   09/23/2017    Next appt: 05/25/2017 med mgt, labs before  Azeez Dunker L. Cheryllynn Sarff, D.O. [facility] [address], [place] [address] Cell Phone (Mon-Fri 8am-5pm):  [phone] On Call:  [phone] & follow prompts after 5pm & weekends Office Phone:  [phone] Office Fax:  [phone]

## 2017-02-03 ENCOUNTER — Other Ambulatory Visit: Payer: Self-pay | Admitting: *Deleted

## 2017-02-03 MED ORDER — LOSARTAN POTASSIUM 50 MG PO TABS: 50.0000 mg | ORAL_TABLET | Freq: Every day | ORAL | 1 refills | Status: DC

## 2017-02-03 NOTE — Telephone Encounter (Signed)
[place]

## 2017-02-05 DIAGNOSIS — M1711 Unilateral primary osteoarthritis, right knee: Secondary | ICD-10-CM | POA: Insufficient documentation

## 2017-02-13 ENCOUNTER — Other Ambulatory Visit: Payer: Self-pay | Admitting: Internal Medicine

## 2017-02-13 DIAGNOSIS — I10 Essential (primary) hypertension: Secondary | ICD-10-CM

## 2017-02-13 MED ORDER — IRBESARTAN 150 MG PO TABS: 150.0000 mg | ORAL_TABLET | Freq: Every day | ORAL | 3 refills | Status: DC

## 2017-02-14 ENCOUNTER — Other Ambulatory Visit: Payer: Self-pay | Admitting: Internal Medicine

## 2017-03-17 ENCOUNTER — Telehealth: Payer: Self-pay | Admitting: *Deleted

## 2017-03-17 NOTE — Telephone Encounter (Signed)
Received fax asking for "AVALON CK 500" which is a backbrace. I spoke with [employer] [facility] and they stated they would need notes to send to insurance to cover the cost. I advised to patient that we didn't have notes, per pt her ortho dr should have them. I then asked her to call [employer] [facility] and ask them to fax her orthopedic dr. The cost of the back brace is $1,644.03 and medicare allows $1,236.11. Per pt she's not sure if she will get this, she thought it was free.  (forms in your folder)

## 2017-03-20 ENCOUNTER — Other Ambulatory Visit: Payer: Self-pay | Admitting: Internal Medicine

## 2017-03-20 NOTE — Telephone Encounter (Signed)
I had spoke with patient and advised they would need to contact ortho.

## 2017-03-20 NOTE — Telephone Encounter (Signed)
If ortho has been doing the evaluation for the brace, then they should fill out the forms.  Did they recommend the brace for her or did the company contact her suggesting she get the brace?

## 2017-04-24 ENCOUNTER — Other Ambulatory Visit: Payer: Self-pay | Admitting: Internal Medicine

## 2017-05-12 ENCOUNTER — Other Ambulatory Visit: Payer: Self-pay | Admitting: Internal Medicine

## 2017-05-19 ENCOUNTER — Other Ambulatory Visit: Payer: Self-pay | Admitting: Internal Medicine

## 2017-05-23 ENCOUNTER — Other Ambulatory Visit: Payer: Medicare Other

## 2017-05-23 DIAGNOSIS — E782 Mixed hyperlipidemia: Secondary | ICD-10-CM

## 2017-05-23 DIAGNOSIS — E1143 Type 2 diabetes mellitus with diabetic autonomic (poly)neuropathy: Secondary | ICD-10-CM

## 2017-05-23 DIAGNOSIS — I10 Essential (primary) hypertension: Secondary | ICD-10-CM

## 2017-05-23 DIAGNOSIS — E1169 Type 2 diabetes mellitus with other specified complication: Secondary | ICD-10-CM

## 2017-05-25 ENCOUNTER — Ambulatory Visit (INDEPENDENT_AMBULATORY_CARE_PROVIDER_SITE_OTHER): Payer: Medicare Other | Admitting: Internal Medicine

## 2017-05-25 ENCOUNTER — Encounter: Payer: Self-pay | Admitting: Internal Medicine

## 2017-05-25 VITALS — BP 160/80 | HR 71 | Temp 98.8°F | Ht 65.0 in | Wt 137.0 lb

## 2017-05-25 DIAGNOSIS — E1143 Type 2 diabetes mellitus with diabetic autonomic (poly)neuropathy: Secondary | ICD-10-CM

## 2017-05-25 DIAGNOSIS — N183 Chronic kidney disease, stage 3 unspecified: Secondary | ICD-10-CM

## 2017-05-25 DIAGNOSIS — K2100 Gastro-esophageal reflux disease with esophagitis, without bleeding: Secondary | ICD-10-CM

## 2017-05-25 DIAGNOSIS — R413 Other amnesia: Secondary | ICD-10-CM

## 2017-05-25 DIAGNOSIS — R5383 Other fatigue: Secondary | ICD-10-CM

## 2017-05-25 DIAGNOSIS — E782 Mixed hyperlipidemia: Secondary | ICD-10-CM

## 2017-05-25 DIAGNOSIS — E1169 Type 2 diabetes mellitus with other specified complication: Secondary | ICD-10-CM

## 2017-05-25 DIAGNOSIS — I10 Essential (primary) hypertension: Secondary | ICD-10-CM | POA: Diagnosis not present

## 2017-05-25 DIAGNOSIS — N3281 Overactive bladder: Secondary | ICD-10-CM

## 2017-05-25 LAB — TEST AUTHORIZATION

## 2017-05-25 LAB — LIPID PANEL
Cholesterol: 136 mg/dL (ref <200)
HDL: 43 mg/dL — ABNORMAL LOW (ref >50)
LDL Cholesterol (Calc): 67 mg/dL (calc)
Non-HDL Cholesterol (Calc): 93 mg/dL (calc) (ref <130)
Total CHOL/HDL Ratio: 3.2 (calc) (ref <5.0)
Triglycerides: 180 mg/dL — ABNORMAL HIGH (ref <150)

## 2017-05-25 LAB — BASIC METABOLIC PANEL
BUN/Creatinine Ratio: 18 (calc) (ref 6–22)
BUN: 18 mg/dL (ref 7–25)
CO2: 28 mmol/L (ref 20–32)
Calcium: 9.7 mg/dL (ref 8.6–10.4)
Chloride: 104 mmol/L (ref 98–110)
Creat: 1.02 mg/dL — ABNORMAL HIGH (ref 0.60–0.93)
Glucose, Bld: 104 mg/dL — ABNORMAL HIGH (ref 65–99)
Potassium: 4.1 mmol/L (ref 3.5–5.3)
Sodium: 140 mmol/L (ref 135–146)

## 2017-05-25 LAB — HEMOGLOBIN A1C
Hgb A1c MFr Bld: 6 % of total Hgb — ABNORMAL HIGH (ref <5.7)
Mean Plasma Glucose: 126 (calc)
eAG (mmol/L): 7 (calc)

## 2017-05-25 LAB — VITAMIN B12: Vitamin B-12: 386 pg/mL (ref 200–1100)

## 2017-05-25 LAB — TSH: TSH: 2.18 mIU/L (ref 0.40–4.50)

## 2017-05-25 NOTE — Progress Notes (Signed)
Location:  [facility] clinic Provider:  Joanell Cressler L. [name], D.O., C.M.D.  Code Status: full code Goals of Care:  Advanced Directives 01/23/2017  Does Patient Have a Medical Advance Directive? Yes  Type of Advance Directive Living will  Does patient want to make changes to medical advance directive? No - Patient declined  Copy of [place] in Chart? -  Would patient like information on creating a medical advance directive? No - Patient declined  Pre-existing out of facility DNR order (yellow form or pink MOST form) -     Chief Complaint  Patient presents with  . Medical Management of Chronic Issues    11mth follow-up, discuss BP meds" do I need all three" myrbetriq too expensive    HPI: Patient is a 77 y.o. female seen today for medical management of chronic diseases.    BP high this morning.  She has not taken her medicine yet b/c she did not eat yet this morning.  Asks if she needs all 3 pills, but is not clear which ARB she is taking.  Says myrbetriq is too expensive, but we discussed that detrol affected her memory when she took it and dried her mouth out worse.  I'm checking for samples.  Memory:  Feels her short-term is bad.  Says her brother-in-law said she had CRS.  She notices word-finding difficulty.  She can play a song multiple times and not remember the words.  She's not forgetting her pills--she has them lined up for a week.  Writes her appts on her calendar right away but says she would otherwise forget.  Her children are noticing some memory loss.    Asks if ASA necessary--definitely in her case.    Asks about meds for memory.  Discussed we need to find out if anything reversible is causing her memory loss first. She scored 29/30 on her mmse last May.    Her back went out and it was awful. She was changing a filter and she could not get up.  Had to hold onto something.  It's much better now.  She did some PT for it.  Took pain meds just a few times and then  went to tylenol.  Has just a little residual soreness.    She is fatigued which has been a chronic complaint.    Past Medical History:  Diagnosis Date  . Benign neoplasm of colon   . Diabetes mellitus without complication (HCC)    O3J currently a goal  . Essential hypertension, benign   . GERD (gastroesophageal reflux disease)   . Hyperlipidemia    Followed by PCP  . Hypertension   . Lumbago   . Migraine, unspecified, without mention of intractable migraine without mention of status migrainosus   . Osteoarthrosis, unspecified whether generalized or localized, unspecified site   . Palpitations    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  . Solitary pulmonary nodule 2012  . Urinary frequency     Past Surgical History:  Procedure Laterality Date  . ABDOMINAL HYSTERECTOMY    . CARDIAC CATHETERIZATION  11/15/1994   Patent coronary arteries and normal LV function  . CARDIAC CATHETERIZATION  11/03/2003   Normal coronary arteries  . COLONOSCOPY  2010   Dr.Bucinni, Normal   . COLONOSCOPY  2004   Dr.Bucinni  . EXERCISE STRESS TEST  11/12/1987   Positive  . TRANSTHORACIC ECHOCARDIOGRAM  05/19/2010   EF >55%, normal-mild    Allergies  Allergen Reactions  . Azithromycin Hives  .  Statins     Myalgias-high doses  . Tramadol Nausea Only  . Gemfibrozil Other (See Comments)    Leg cramps at night    Outpatient Encounter Medications as of 05/25/2017  Medication Sig  . acetaminophen (TYLENOL) 500 MG tablet Take 500 mg by mouth as needed for pain.  Marland Kitchen amLODipine (NORVASC) 2.5 MG tablet Take 1 tablet (2.5 mg total) by mouth at bedtime.  Marland Kitchen aspirin 325 MG tablet Take 1/2 tablet by mouth daily  . Cholecalciferol (VITAMIN D3) 2000 units CHEW Chew 1 capsule by mouth daily.  Marland Kitchen conjugated estrogens (PREMARIN) vaginal cream Pea size every 2-3 days  . irbesartan (AVAPRO) 150 MG tablet Take 1 tablet (150 mg total) by mouth daily.  . lansoprazole (PREVACID) 30 MG capsule Take 30 mg by mouth daily at 12  noon.   . lovastatin (MEVACOR) 20 MG tablet Take 1 tablet (20 mg total) by mouth at bedtime.  . metFORMIN (GLUCOPHAGE) 1000 MG tablet TAKE ONE TABLET BY MOUTH TWICE DAILY with meals  . metoprolol tartrate (LOPRESSOR) 100 MG tablet Take 0.5 tablets (50 mg total) by mouth 2 (two) times daily.  . mirabegron ER (MYRBETRIQ) 25 MG TB24 tablet Take 1 tablet (25 mg total) by mouth daily.  . Multiple Vitamin (MULTIVITAMIN) tablet Take 1 tablet by mouth daily.   . Omega-3 1000 MG CAPS Take 1 g by mouth.   No facility-administered encounter medications on file as of 05/25/2017.     Review of Systems:  Review of Systems  Constitutional: Positive for malaise/fatigue. Negative for chills and fever.  HENT: Negative for congestion.   Eyes: Negative for blurred vision.  Respiratory: Negative for shortness of breath.   Cardiovascular: Negative for chest pain, palpitations and leg swelling.  Gastrointestinal: Positive for abdominal pain and heartburn. Negative for blood in stool, constipation, diarrhea, melena, nausea and vomiting.  Genitourinary: Negative for dysuria.  Musculoskeletal: Positive for joint pain. Negative for falls.  Skin: Negative for itching and rash.  Neurological: Negative for dizziness.  Psychiatric/Behavioral: Positive for memory loss. Negative for depression. The patient is not nervous/anxious and does not have insomnia.     Health Maintenance  Topic Date Due  . OPHTHALMOLOGY EXAM  06/28/2016  . INFLUENZA VACCINE  08/10/2017  . HEMOGLOBIN A1C  11/23/2017  . FOOT EXAM  01/23/2018  . TETANUS/TDAP  09/02/2024  . DEXA SCAN  Completed  . PNA vac Low Risk Adult  Completed    Physical Exam: Vitals:   05/25/17 0807  BP: (!) 160/80  Pulse: 71  Temp: 98.8 F (37.1 C)  TempSrc: Oral  SpO2: 97%  Weight: 137 lb (62.1 kg)  Height: 5\' 5"  (1.651 m)   Body mass index is 22.8 kg/m. Physical Exam  Constitutional: She is oriented to person, place, and time. She appears  well-developed and well-nourished. No distress.  Cardiovascular: Normal rate, regular rhythm, normal heart sounds and intact distal pulses.  Pulmonary/Chest: Effort normal and breath sounds normal. No respiratory distress.  Abdominal: Bowel sounds are normal.  Musculoskeletal: Normal range of motion.  Neurological: She is alert and oriented to person, place, and time.  Skin: Skin is warm and dry.  Psychiatric: She has a normal mood and affect.    Labs reviewed: Basic Metabolic Panel: Recent Labs    12/07/16 1045 01/19/17 0806 05/23/17 0807  NA 136 140 140  K 4.2 4.2 4.1  CL 103 103 104  CO2 25 28 28   GLUCOSE 142* 119* 104*  BUN 19 16 18  CREATININE 1.11* 1.02* 1.02*  CALCIUM 9.6 9.7 9.7   Liver Function Tests: Recent Labs    12/07/16 1045 01/19/17 0806  AST 13 13  ALT 12 12  BILITOT 0.3 0.3  PROT 7.0 7.4   No results for input(s): LIPASE, AMYLASE in the last 8760 hours. No results for input(s): AMMONIA in the last 8760 hours. CBC: Recent Labs    01/19/17 0806  WBC 5.1  NEUTROABS 2,525  HGB 13.2  HCT 38.8  MCV 89.2  PLT 305   Lipid Panel: Recent Labs    01/19/17 0806 05/23/17 0807  CHOL 149 136  HDL 47* 43*  LDLCALC 72 67  TRIG 201* 180*  CHOLHDL 3.2 3.2   Lab Results  Component Value Date   HGBA1C 6.0 (H) 05/23/2017    Procedures since last visit: No results found.  Assessment/Plan 1. 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) - Hemoglobin A1c; Future -controlled, cont to work on diet and exercise  2. Essential hypertension - still needs three meds as best I can tell -pt to call back with what meds really are - Basic metabolic panel; Future  3. Gastroesophageal reflux disease with esophagitis -cont prevacid and conservative measures  4. Mixed hyperlipidemia due to type 2 diabetes mellitus (HCC) - cont lovastatin, LDL at goal now - Lipid panel; Future  5. CKD (chronic kidney disease) stage 3, GFR  30-59 ml/min (HCC) -stable, Avoid nephrotoxic agents like nsaids, dose adjust renally excreted meds, hydrate - Basic metabolic panel; Future  6. Overactive bladder -cont myrbetriq, samples provided for one month  7. Memory loss - check b12 and tsh, if normal, will get CT brain as last was 2011 and already showed some chronic ischemic vessel changes - Basic metabolic panel; Future  8. Fatigue, unspecified type -ongoing, check b12, tsh  Labs/tests ordered:   Orders Placed This Encounter  Procedures  . Hemoglobin A1c    Standing Status:   Future    Standing Expiration Date:   01/25/2018  . Basic metabolic panel    Standing Status:   Future    Standing Expiration Date:   01/25/2018    Order Specific Question:   Has the patient fasted?    Answer:   Yes  . Lipid panel    Standing Status:   Future    Standing Expiration Date:   01/25/2018    Order Specific Question:   Has the patient fasted?    Answer:   Yes    Next appt:  4 mos med mgt, labs before   Lane Jacqulene Huntley, D.O. [facility] [address], [place] [address] Cell Phone (Mon-Fri 8am-5pm):  [phone] On Call:  [phone] & follow prompts after 5pm & weekends Office Phone:  [phone] Office Fax:  [phone]

## 2017-05-25 NOTE — Patient Instructions (Addendum)
Please call me back about which medications you are taking for your blood pressure. ?losartan or irbesartan  Schedule your wellness visit with Clarise Cruz for the year.

## 2017-05-29 ENCOUNTER — Telehealth: Payer: Self-pay | Admitting: *Deleted

## 2017-05-29 NOTE — Telephone Encounter (Signed)
-----   Message from Gayland Curry, DO sent at 05/29/2017  7:44 AM EDT ----- Thyroid was normal, but B12 is lower than recommended.  B12 is 386--goal is over 500 for older adults so I recommend she take B12 1092mcg daily over the counter.  Please notify her and add to her med list.

## 2017-06-08 ENCOUNTER — Encounter: Payer: Self-pay | Admitting: Internal Medicine

## 2017-06-08 DIAGNOSIS — E538 Deficiency of other specified B group vitamins: Secondary | ICD-10-CM | POA: Insufficient documentation

## 2017-06-26 ENCOUNTER — Ambulatory Visit: Payer: Medicare Other

## 2017-06-28 ENCOUNTER — Telehealth: Payer: Self-pay

## 2017-06-28 NOTE — Telephone Encounter (Signed)
Called patient to try to reschedule AWV in the office. No answer and voicemail box is not set up to leave a callback number

## 2017-07-11 ENCOUNTER — Other Ambulatory Visit: Payer: Self-pay | Admitting: Internal Medicine

## 2017-07-24 ENCOUNTER — Telehealth: Payer: Self-pay | Admitting: Internal Medicine

## 2017-07-24 NOTE — Telephone Encounter (Signed)
I called the pt to reschedule AWV that was missed in June.  I was unable to leave a message because the voicemail hasn't been set up. VDM (DD)

## 2017-07-31 ENCOUNTER — Other Ambulatory Visit: Payer: Self-pay | Admitting: Internal Medicine

## 2017-08-09 ENCOUNTER — Telehealth: Payer: Self-pay

## 2017-08-09 NOTE — Telephone Encounter (Signed)
Called patient to try to schedule AWV in the office. No answer and no voicemail to leave a call back number

## 2017-09-04 LAB — HM MAMMOGRAPHY

## 2017-09-05 ENCOUNTER — Encounter: Payer: Self-pay | Admitting: *Deleted

## 2017-09-06 ENCOUNTER — Other Ambulatory Visit: Payer: Self-pay | Admitting: *Deleted

## 2017-09-06 DIAGNOSIS — I10 Essential (primary) hypertension: Secondary | ICD-10-CM

## 2017-09-06 MED ORDER — AMLODIPINE BESYLATE 2.5 MG PO TABS: 2.5000 mg | ORAL_TABLET | Freq: Every day | ORAL | 3 refills | Status: DC

## 2017-09-06 NOTE — Telephone Encounter (Signed)
Mandy Perry

## 2017-09-07 ENCOUNTER — Telehealth: Payer: Self-pay | Admitting: Internal Medicine

## 2017-09-07 NOTE — Telephone Encounter (Signed)
Please call pt tomorrow, she returned your call

## 2017-09-07 NOTE — Telephone Encounter (Signed)
I called the pt to schedule AWV-S.  I wasn't able to leave a message because voicemail hasn't been set up. VDM (DD)

## 2017-09-08 ENCOUNTER — Telehealth: Payer: Self-pay | Admitting: Internal Medicine

## 2017-09-08 NOTE — Telephone Encounter (Signed)
I returned the patient's call, but there was no answer and voicemail still hasn't been set up. VDM (DD)

## 2017-09-26 ENCOUNTER — Encounter: Payer: Self-pay | Admitting: Internal Medicine

## 2017-09-26 ENCOUNTER — Ambulatory Visit: Payer: Medicare Other | Admitting: Internal Medicine

## 2017-09-26 VITALS — BP 124/70 | HR 66 | Temp 98.5°F | Ht 65.0 in | Wt 136.0 lb

## 2017-09-26 DIAGNOSIS — N3281 Overactive bladder: Secondary | ICD-10-CM | POA: Diagnosis not present

## 2017-09-26 DIAGNOSIS — E1122 Type 2 diabetes mellitus with diabetic chronic kidney disease: Secondary | ICD-10-CM | POA: Diagnosis not present

## 2017-09-26 DIAGNOSIS — R300 Dysuria: Secondary | ICD-10-CM

## 2017-09-26 DIAGNOSIS — R809 Proteinuria, unspecified: Secondary | ICD-10-CM

## 2017-09-26 DIAGNOSIS — N183 Chronic kidney disease, stage 3 (moderate): Secondary | ICD-10-CM

## 2017-09-26 DIAGNOSIS — N952 Postmenopausal atrophic vaginitis: Secondary | ICD-10-CM

## 2017-09-26 LAB — POCT URINALYSIS DIPSTICK
Nitrite, UA: 0
Protein, UA: POSITIVE — AB
Spec Grav, UA: 1.02
Urobilinogen, UA: 0.2 U/dL
pH, UA: 5

## 2017-09-26 MED ORDER — ESTROGENS, CONJUGATED 0.625 MG/GM VA CREA: TOPICAL_CREAM | VAGINAL | 3 refills | Status: DC

## 2017-09-26 MED ORDER — CIPROFLOXACIN HCL 250 MG PO TABS: 250.0000 mg | ORAL_TABLET | Freq: Two times a day (BID) | ORAL | 0 refills | Status: DC

## 2017-09-26 NOTE — Progress Notes (Signed)
Patient ID: Mandy Perry, female   DOB: July 13, 1940, 77 y.o.   MRN: [id number]   [facility] OFFICE  Provider: DR Arletha Grippe  Code Status:  Goals of Care:  Advanced Directives 01/23/2017  Does Patient Have a Medical Advance Directive? Yes  Type of Advance Directive Living will  Does patient want to make changes to medical advance directive? No - Patient declined  Copy of [place] in Chart? -  Would patient like information on creating a medical advance directive? No - Patient declined  Pre-existing out of facility DNR order (yellow form or pink MOST form) -     Chief Complaint  Patient presents with  . Acute Visit    Urinary frequency, urgency and burning x 4 days  . Medication Refill    Myrbetriq and Premarin Cream    HPI: Patient is a 77 y.o. female seen today for an acute visit for UTI sx's - burning, frequency and urgency x 5 days. Prior to sx's she spent prolonged period of time (15 min) soaking in hot bath water. No hematuria, no N/V. She has intermittent abdominal pain but this is chronic. She has frequent loose stools after drinking glucerna. She has a hx GERD, OAB and DM. BS 100-120s. She ran out of myrbetriq samples 2 weeks ago  Past Medical History:  Diagnosis Date  . Benign neoplasm of colon   . Diabetes mellitus without complication (HCC)    W0J currently a goal  . Essential hypertension, benign   . GERD (gastroesophageal reflux disease)   . Hyperlipidemia    Followed by PCP  . Hypertension   . Lumbago   . Migraine, unspecified, without mention of intractable migraine without mention of status migrainosus   . Osteoarthrosis, unspecified whether generalized or localized, unspecified site   . Palpitations    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  . Solitary pulmonary nodule 2012  . Urinary frequency     Past Surgical History:  Procedure Laterality Date  . ABDOMINAL HYSTERECTOMY    . CARDIAC CATHETERIZATION  11/15/1994   Patent coronary arteries and  normal LV function  . CARDIAC CATHETERIZATION  11/03/2003   Normal coronary arteries  . COLONOSCOPY  2010   Dr.Bucinni, Normal   . COLONOSCOPY  2004   Dr.Bucinni  . EXERCISE STRESS TEST  11/12/1987   Positive  . TRANSTHORACIC ECHOCARDIOGRAM  05/19/2010   EF >55%, normal-mild     reports that she quit smoking about 40 years ago. Her smoking use included cigarettes. She has a 10.00 pack-year smoking history. She has never used smokeless tobacco. She reports that she does not drink alcohol or use drugs. Social History   Socioeconomic History  . Marital status: Widowed    Spouse name: Not on file  . Number of children: Not on file  . Years of education: Not on file  . Highest education level: Not on file  Occupational History  . Occupation: retired Marine scientist  Social Needs  . Financial resource strain: Not on file  . Food insecurity:    Worry: Not on file    Inability: Not on file  . Transportation needs:    Medical: Not on file    Non-medical: Not on file  Tobacco Use  . Smoking status: Former Smoker    Packs/day: 0.50    Years: 20.00    Pack years: 10.00    Types: Cigarettes    Last attempt to quit: 01/10/1977    Years since quitting: 40.7  .  Smokeless tobacco: Never Used  . Tobacco comment: Quit at age 14  Substance and Sexual Activity  . Alcohol use: No  . Drug use: No  . Sexual activity: Not Currently  Lifestyle  . Physical activity:    Days per week: Not on file    Minutes per session: Not on file  . Stress: Not on file  Relationships  . Social connections:    Talks on phone: Not on file    Gets together: Not on file    Attends religious service: Not on file    Active member of club or organization: Not on file    Attends meetings of clubs or organizations: Not on file    Relationship status: Not on file  . Intimate partner violence:    Fear of current or ex partner: Not on file    Emotionally abused: Not on file    Physically abused: Not on file    Forced  sexual activity: Not on file  Other Topics Concern  . Not on file  Social History Narrative   She is a married,  Widowed -husband died 2014-04-29   Retired Marine scientist    mother of 75, grandmother 59.    She had 10 years a history of about a half pack a day but quit 10 years ago.    Her exercising is limited by her osteoarthritis pains. But she tries to do some of the exercises with walking on a daily basis.   Alcohol none    Family History  Problem Relation Age of Onset  . CVA Mother   . Heart attack Father   . Cancer Father        Prostate cancer  . COPD Brother   . Diabetes Sister   . Colon cancer Sister   . Diabetes Sister   . Cancer Brother     Allergies  Allergen Reactions  . Azithromycin Hives  . Statins     Myalgias-high doses  . Tramadol Nausea Only  . Gemfibrozil Other (See Comments)    Leg cramps at night    Outpatient Encounter Medications as of 09/26/2017  Medication Sig  . acetaminophen (TYLENOL) 500 MG tablet Take 500 mg by mouth as needed for pain.  Marland Kitchen amLODipine (NORVASC) 2.5 MG tablet Take 1 tablet (2.5 mg total) by mouth at bedtime.  Marland Kitchen aspirin 325 MG tablet Take 1/2 tablet by mouth daily  . Cholecalciferol (VITAMIN D3) 2000 units CHEW Chew 1 capsule by mouth daily.  Marland Kitchen conjugated estrogens (PREMARIN) vaginal cream Pea size every 2-3 days  . irbesartan (AVAPRO) 150 MG tablet Take 1 tablet (150 mg total) by mouth daily.  . lansoprazole (PREVACID) 30 MG capsule Take 30 mg by mouth daily at 12 noon.   . lovastatin (MEVACOR) 20 MG tablet TAKE ONE TABLET BY MOUTH AT BEDTIME  . metFORMIN (GLUCOPHAGE) 1000 MG tablet TAKE ONE TABLET BY MOUTH TWICE DAILY with meals  . metoprolol tartrate (LOPRESSOR) 100 MG tablet TAKE 1/2 TABLET BY MOUTH TWICE DAILY  . mirabegron ER (MYRBETRIQ) 25 MG TB24 tablet Take 1 tablet (25 mg total) by mouth daily.  . Multiple Vitamin (MULTIVITAMIN) tablet Take 1 tablet by mouth daily.   . Omega-3 1000 MG CAPS Take 1 g by mouth.  . vitamin B-12  (CYANOCOBALAMIN) 1000 MCG tablet Take 1,000 mcg by mouth daily.   No facility-administered encounter medications on file as of 09/26/2017.     Review of Systems:  Review of Systems  Gastrointestinal: Positive for  abdominal distention.  Genitourinary: Positive for dysuria, frequency and urgency. Negative for hematuria and vaginal discharge.  All other systems reviewed and are negative.   Health Maintenance  Topic Date Due  . INFLUENZA VACCINE  10/03/2017 (Originally 08/10/2017)  . OPHTHALMOLOGY EXAM  10/03/2017 (Originally 06/28/2016)  . HEMOGLOBIN A1C  11/23/2017  . FOOT EXAM  01/23/2018  . TETANUS/TDAP  09/02/2024  . DEXA SCAN  Completed  . PNA vac Low Risk Adult  Completed    Physical Exam: Vitals:   09/26/17 1442  BP: 124/70  Pulse: 66  Temp: 98.5 F (36.9 C)  SpO2: 97%  Weight: 136 lb (61.7 kg)  Height: 5\' 5"  (1.651 m)   Body mass index is 22.63 kg/m. Physical Exam  Constitutional: She is oriented to person, place, and time. She appears well-developed and well-nourished.  HENT:  Mouth/Throat: Oropharynx is clear and moist. No oropharyngeal exudate.  MMM; no oral thrush  Eyes: Pupils are equal, round, and reactive to light. No scleral icterus.  Neck: Neck supple. Carotid bruit is not present. No tracheal deviation present. No thyromegaly present.  Cardiovascular: Normal rate, regular rhythm and intact distal pulses. Exam reveals no gallop and no friction rub.  Murmur (1/6 SEM) heard. No LE edema b/l. no calf TTP.   Pulmonary/Chest: Effort normal and breath sounds normal. No stridor. No respiratory distress. She has no wheezes. She has no rales.  Abdominal: Soft. Normal appearance and bowel sounds are normal. She exhibits distension. She exhibits no mass. There is no hepatomegaly. There is tenderness (epigastric). There is no rigidity, no rebound and no guarding. No hernia.  Musculoskeletal: She exhibits edema.  Lymphadenopathy:    She has no cervical adenopathy.    Neurological: She is alert and oriented to person, place, and time. She has normal reflexes.  Skin: Skin is warm and dry. No rash noted.  Psychiatric: She has a normal mood and affect. Her behavior is normal. Judgment and thought content normal.    Labs reviewed: Basic Metabolic Panel: Recent Labs    12/07/16 1045 01/19/17 0806 05/23/17 0807  NA 136 140 140  K 4.2 4.2 4.1  CL 103 103 104  CO2 25 28 28   GLUCOSE 142* 119* 104*  BUN 19 16 18   CREATININE 1.11* 1.02* 1.02*  CALCIUM 9.6 9.7 9.7  TSH  --   --  2.18   Liver Function Tests: Recent Labs    12/07/16 1045 01/19/17 0806  AST 13 13  ALT 12 12  BILITOT 0.3 0.3  PROT 7.0 7.4   No results for input(s): LIPASE, AMYLASE in the last 8760 hours. No results for input(s): AMMONIA in the last 8760 hours. CBC: Recent Labs    01/19/17 0806  WBC 5.1  NEUTROABS 2,525  HGB 13.2  HCT 38.8  MCV 89.2  PLT 305   Lipid Panel: Recent Labs    01/19/17 0806 05/23/17 0807  CHOL 149 136  HDL 47* 43*  LDLCALC 72 67  TRIG 201* 180*  CHOLHDL 3.2 3.2   Lab Results  Component Value Date   HGBA1C 6.0 (H) 05/23/2017    Procedures since last visit: No results found.  Assessment/Plan   ICD-10-CM   1. Dysuria R30.0 POC Urinalysis Dipstick    Culture, Urine  2. Overactive bladder N32.81   3. Proteinuria, unspecified type R80.9   4. Controlled type 2 diabetes mellitus with stage 3 chronic kidney disease, without long-term current use of insulin (HCC) E11.22 Microalbumin/Creatinine Ratio, Urine   N18.3 Microalbumin/Creatinine  Ratio, Urine  5. 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 Perlie Gold   [facility] [address] [place], [place] [address] [phone] Cell (Monday-Friday 8 AM - 5 PM) [phone] After 5 PM and follow prompts

## 2017-09-26 NOTE — Patient Instructions (Addendum)
START CIPRO 250MG  2 TIMES DAILY X 3 DAYS for bladder infection  Will call with urine culture results  PLEASE BRING URINE SAMPLE BACK TO OFFICE TOMORROW to check to see how diabetes affecting kidneys  Follow up with Dr [name] as scheduled or sooner if need be

## 2017-09-28 LAB — URINE CULTURE
MICRO NUMBER:: 91116676
SPECIMEN QUALITY: ADEQUATE

## 2017-09-28 LAB — MICROALBUMIN / CREATININE URINE RATIO
Creatinine, Urine: 46 mg/dL (ref 20–275)
Microalb Creat Ratio: 115 mcg/mg creat — ABNORMAL HIGH (ref <30)
Microalb, Ur: 5.3 mg/dL

## 2017-10-02 ENCOUNTER — Other Ambulatory Visit: Payer: Self-pay | Admitting: Internal Medicine

## 2017-10-04 ENCOUNTER — Other Ambulatory Visit: Payer: Self-pay

## 2017-10-04 DIAGNOSIS — N183 Chronic kidney disease, stage 3 unspecified: Secondary | ICD-10-CM

## 2017-10-05 ENCOUNTER — Telehealth: Payer: Self-pay

## 2017-10-05 NOTE — Telephone Encounter (Signed)
Patient stopped by to ask about labs. I informed patient labs mailed due to multiple attempts to contact her with no success. Patient states it is best to call her home number which she does not know and will call to update  Refer to labs dated 09/27/17  Patient verbalized understanding of labs. RX was sent to [facility] yesterday for approval due to High Risk warning when attempting to refill.   Patient also mentioned that something is not right when she urinates. Patient described sensation as a slight tingling when urinating (best way she could describe). Patient denies pain.  Please advise on RX status and plan of action to address ongoing dysuria

## 2017-10-05 NOTE — Telephone Encounter (Signed)
Rx not sent as pt already taking ARB; she can contact Dr [name] for further recommendations on dysuria

## 2017-10-05 NOTE — Telephone Encounter (Signed)
I called patient, no answer. I will try again later.

## 2017-10-06 NOTE — Telephone Encounter (Signed)
Patient aware of Dr.Carter's additional response and verbalized understanding. Patient will see Dr.Reed next week

## 2017-10-10 ENCOUNTER — Other Ambulatory Visit: Payer: Medicare Other

## 2017-10-10 DIAGNOSIS — I10 Essential (primary) hypertension: Secondary | ICD-10-CM

## 2017-10-10 DIAGNOSIS — E1143 Type 2 diabetes mellitus with diabetic autonomic (poly)neuropathy: Secondary | ICD-10-CM

## 2017-10-10 DIAGNOSIS — N183 Chronic kidney disease, stage 3 unspecified: Secondary | ICD-10-CM

## 2017-10-10 DIAGNOSIS — E782 Mixed hyperlipidemia: Principal | ICD-10-CM

## 2017-10-10 DIAGNOSIS — R413 Other amnesia: Secondary | ICD-10-CM

## 2017-10-10 DIAGNOSIS — E1169 Type 2 diabetes mellitus with other specified complication: Secondary | ICD-10-CM

## 2017-10-11 LAB — HEMOGLOBIN A1C
Hgb A1c MFr Bld: 6.3 % of total Hgb — ABNORMAL HIGH (ref <5.7)
Mean Plasma Glucose: 134 (calc)
eAG (mmol/L): 7.4 (calc)

## 2017-10-11 LAB — BASIC METABOLIC PANEL
BUN/Creatinine Ratio: 15 (calc) (ref 6–22)
BUN: 16 mg/dL (ref 7–25)
CO2: 25 mmol/L (ref 20–32)
Calcium: 9.4 mg/dL (ref 8.6–10.4)
Chloride: 104 mmol/L (ref 98–110)
Creat: 1.08 mg/dL — ABNORMAL HIGH (ref 0.60–0.93)
Glucose, Bld: 105 mg/dL — ABNORMAL HIGH (ref 65–99)
Potassium: 4.2 mmol/L (ref 3.5–5.3)
Sodium: 139 mmol/L (ref 135–146)

## 2017-10-11 LAB — LIPID PANEL
Cholesterol: 149 mg/dL (ref <200)
HDL: 41 mg/dL — ABNORMAL LOW (ref >50)
LDL Cholesterol (Calc): 80 mg/dL (calc)
Non-HDL Cholesterol (Calc): 108 mg/dL (calc) (ref <130)
Total CHOL/HDL Ratio: 3.6 (calc) (ref <5.0)
Triglycerides: 185 mg/dL — ABNORMAL HIGH (ref <150)

## 2017-10-16 ENCOUNTER — Ambulatory Visit: Payer: Medicare Other | Admitting: Internal Medicine

## 2017-10-16 ENCOUNTER — Encounter: Payer: Self-pay | Admitting: Internal Medicine

## 2017-10-16 VITALS — BP 128/70 | HR 73 | Temp 98.5°F | Ht 65.0 in | Wt 136.0 lb

## 2017-10-16 DIAGNOSIS — M51369 Other intervertebral disc degeneration, lumbar region without mention of lumbar back pain or lower extremity pain: Secondary | ICD-10-CM | POA: Insufficient documentation

## 2017-10-16 DIAGNOSIS — N183 Chronic kidney disease, stage 3 unspecified: Secondary | ICD-10-CM

## 2017-10-16 DIAGNOSIS — Z23 Encounter for immunization: Secondary | ICD-10-CM

## 2017-10-16 DIAGNOSIS — N3281 Overactive bladder: Secondary | ICD-10-CM

## 2017-10-16 DIAGNOSIS — M5136 Other intervertebral disc degeneration, lumbar region: Secondary | ICD-10-CM

## 2017-10-16 DIAGNOSIS — E782 Mixed hyperlipidemia: Secondary | ICD-10-CM

## 2017-10-16 DIAGNOSIS — R194 Change in bowel habit: Secondary | ICD-10-CM

## 2017-10-16 DIAGNOSIS — L821 Other seborrheic keratosis: Secondary | ICD-10-CM

## 2017-10-16 DIAGNOSIS — R002 Palpitations: Secondary | ICD-10-CM

## 2017-10-16 DIAGNOSIS — E1169 Type 2 diabetes mellitus with other specified complication: Secondary | ICD-10-CM

## 2017-10-16 DIAGNOSIS — E1143 Type 2 diabetes mellitus with diabetic autonomic (poly)neuropathy: Secondary | ICD-10-CM

## 2017-10-16 DIAGNOSIS — E1122 Type 2 diabetes mellitus with diabetic chronic kidney disease: Secondary | ICD-10-CM

## 2017-10-16 NOTE — Progress Notes (Signed)
Location:  [facility] clinic Provider:  Saryiah Bencosme L. [name], D.O., C.M.D.  Goals of Care:  Advanced Directives 10/16/2017  Does Patient Have a Medical Advance Directive? Yes  Type of Paramedic of [place];Living will  Does patient want to make changes to medical advance directive? No - Patient declined  Copy of [place] in Chart? Yes  Would patient like information on creating a medical advance directive? -  Pre-existing out of facility DNR order (yellow form or pink MOST form) -   Chief Complaint  Patient presents with  . Medical Management of Chronic Issues    68mth follow-up, discuss lab results    HPI: Patient is a 77 y.o. female seen today for medical management of chronic diseases.    She's been here since I last saw her and she was having frequency, urgency and burning for 4 days.  She has chronic overactive bladder and was out of myrbetriq samples for 2 weeks at the time of the appt.    She was treated for UTI with cipro and was to walk back in with urine sample for microalbumin which was posiitve.She's already been on an ARB.  She feels like the infection has cleared up.  Her urine does not come out right away when she sits.  Sometimes she has to run the water to go.  She had seen a urologist before but she moved away (?gyn).   TG have trended up a little bit.   Renal function is about the same as it's been for over a year.  hba1c 6.3 up from 6 last time in May.  Very seldom misses metformin if she gets in at night and she's tired.  May occasionally take an hour after her meal rather than with it.    Time to time, she gets pain in her forearm from her wrist to her elbow--she's righthanded and it's in that arm.  Stings and pressure.  She has not noticed any correlation with anything. Lasts 30 mins to an hour.    Has 4 new moles on her right leg and one on her back.  They are all the size of a pencil eraser or smaller, nontender, lack ABCDEs.   Advised to monitor them.  Was getting leg cramps at night.  She started to take the losartan in the morning and now this is better.    Her biggest concern is her change in bowel habits.  Previously, bowels had been normal.   Two sisters have had colon cancer.  Sometimes she will have diarrhea after meals--30 mins.  Then will have constipation.  She asks if lactose intolerance can come later in life.  Noticed a correlation with glucerna.  Feels like cheese constipates her though.   Has only just had her cscope 11/18.  Next due 5 years.  Sees Dr. Cristina Gong.    Having lower back pain when doing things at the house.  March was putting a filter in at her house, her back has ached since.  Will get relief sometimes. Xray with ortho showed arthritis.  He gave her pain medication.  She was in [place] and has been using aspercreme with lidocaine--lasts 6-7 hrs. Then puts more on.  Avoids lifting. It does bug her pretty good.  Uses tylenol at bedtime which helps then.  She takes a benadryl to so she can rest. Talked about heating pad for 20 mins.     Weight stable in mid-130s.  No blood in stool.  H/h has been normal.    When she got her metoprolol the last time, she had some palpitations 2-3 days later.  She reports that the pharmacy said she got a new generic.  It makes her short of breath for a moment, too.    Past Medical History:  Diagnosis Date  . Benign neoplasm of colon   . Diabetes mellitus without complication (HCC)    W5I currently a goal  . Essential hypertension, benign   . GERD (gastroesophageal reflux disease)   . Hyperlipidemia    Followed by PCP  . Hypertension   . Lumbago   . Migraine, unspecified, without mention of intractable migraine without mention of status migrainosus   . Osteoarthrosis, unspecified whether generalized or localized, unspecified site   . Palpitations    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  . Solitary pulmonary nodule 2012  . Urinary frequency     Past Surgical  History:  Procedure Laterality Date  . ABDOMINAL HYSTERECTOMY    . CARDIAC CATHETERIZATION  11/15/1994   Patent coronary arteries and normal LV function  . CARDIAC CATHETERIZATION  11/03/2003   Normal coronary arteries  . COLONOSCOPY  2010   Dr.Bucinni, Normal   . COLONOSCOPY  2004   Dr.Bucinni  . EXERCISE STRESS TEST  11/12/1987   Positive  . TRANSTHORACIC ECHOCARDIOGRAM  05/19/2010   EF >55%, normal-mild    Allergies  Allergen Reactions  . Azithromycin Hives  . Statins     Myalgias-high doses  . Tramadol Nausea Only  . Gemfibrozil Other (See Comments)    Leg cramps at night    Outpatient Encounter Medications as of 10/16/2017  Medication Sig  . acetaminophen (TYLENOL) 500 MG tablet Take 500 mg by mouth as needed for pain.  Marland Kitchen amLODipine (NORVASC) 2.5 MG tablet Take 1 tablet (2.5 mg total) by mouth at bedtime.  Marland Kitchen aspirin 325 MG tablet Take 1/2 tablet by mouth daily  . Cholecalciferol (VITAMIN D3) 2000 units CHEW Chew 1 capsule by mouth daily.  Marland Kitchen conjugated estrogens (PREMARIN) vaginal cream Pea sized amt PV 2 times per week for atrophic vaginitis  . irbesartan (AVAPRO) 150 MG tablet Take 1 tablet (150 mg total) by mouth daily.  . lansoprazole (PREVACID) 30 MG capsule Take 30 mg by mouth daily at 12 noon.   . lovastatin (MEVACOR) 20 MG tablet TAKE ONE TABLET BY MOUTH AT BEDTIME  . metFORMIN (GLUCOPHAGE) 1000 MG tablet TAKE ONE TABLET BY MOUTH TWICE DAILY with meals  . metoprolol tartrate (LOPRESSOR) 100 MG tablet take ONE-HALF tablet (50mg ) BY MOUTH TWICE DAILY  . mirabegron ER (MYRBETRIQ) 25 MG TB24 tablet Take 1 tablet (25 mg total) by mouth daily.  . Multiple Vitamin (MULTIVITAMIN) tablet Take 1 tablet by mouth daily.   . Omega-3 1000 MG CAPS Take 1 g by mouth.  . vitamin B-12 (CYANOCOBALAMIN) 1000 MCG tablet Take 1,000 mcg by mouth daily.  . [DISCONTINUED] ciprofloxacin (CIPRO) 250 MG tablet Take 1 tablet (250 mg total) by mouth 2 (two) times daily. For bladder infection    No facility-administered encounter medications on file as of 10/16/2017.     Review of Systems:  Review of Systems  Constitutional: Negative for chills, fever, malaise/fatigue and weight loss.       Not as energetic as she used to be  HENT: Negative for congestion and hearing loss.   Eyes: Negative for blurred vision.  Respiratory: Negative for cough and shortness of breath.   Cardiovascular: Positive for palpitations. Negative for chest pain,  orthopnea, leg swelling and PND.  Gastrointestinal: Positive for constipation and diarrhea. Negative for abdominal pain, blood in stool and melena.  Genitourinary: Positive for frequency and urgency. Negative for dysuria, flank pain and hematuria.  Musculoskeletal: Positive for back pain. Negative for falls and joint pain.  Neurological: Negative for dizziness, focal weakness and loss of consciousness.       Stinging pain right forearm between wrist and elbow  Psychiatric/Behavioral: Positive for memory loss. Negative for depression. The patient is not nervous/anxious and does not have insomnia.     Health Maintenance  Topic Date Due  . OPHTHALMOLOGY EXAM  06/28/2016  . INFLUENZA VACCINE  08/10/2017  . FOOT EXAM  01/23/2018  . HEMOGLOBIN A1C  04/11/2018  . TETANUS/TDAP  09/02/2024  . DEXA SCAN  Completed  . PNA vac Low Risk Adult  Completed    Physical Exam: Vitals:   10/16/17 0824  BP: 128/70  Pulse: 73  Temp: 98.5 F (36.9 C)  TempSrc: Oral  SpO2: 96%  Weight: 136 lb (61.7 kg)  Height: 5\' 5"  (1.651 m)   Body mass index is 22.63 kg/m. Physical Exam  Constitutional: She is oriented to person, place, and time. She appears well-developed and well-nourished. No distress.  HENT:  Head: Normocephalic and atraumatic.  Eyes:  glasses  Cardiovascular: Normal rate, regular rhythm, normal heart sounds and intact distal pulses.  Pulmonary/Chest: Effort normal and breath sounds normal. No respiratory distress.  Abdominal: Soft. Bowel  sounds are normal.  Musculoskeletal: Normal range of motion. She exhibits no tenderness.  Neurological: She is alert and oriented to person, place, and time. No cranial nerve deficit.  Skin: Skin is warm and dry.  4 seborrheic keratoses vs nevi of right leg and upper back, all smaller than pencil eraser, round, dark brown, regular borders, nonpruritic and nonpainful  Psychiatric: She has a normal mood and affect.    Labs reviewed: Basic Metabolic Panel: Recent Labs    01/19/17 0806 05/23/17 0807 10/10/17 1032  NA 140 140 139  K 4.2 4.1 4.2  CL 103 104 104  CO2 28 28 25   GLUCOSE 119* 104* 105*  BUN 16 18 16   CREATININE 1.02* 1.02* 1.08*  CALCIUM 9.7 9.7 9.4  TSH  --  2.18  --    Liver Function Tests: Recent Labs    12/07/16 1045 01/19/17 0806  AST 13 13  ALT 12 12  BILITOT 0.3 0.3  PROT 7.0 7.4   No results for input(s): LIPASE, AMYLASE in the last 8760 hours. No results for input(s): AMMONIA in the last 8760 hours. CBC: Recent Labs    01/19/17 0806  WBC 5.1  NEUTROABS 2,525  HGB 13.2  HCT 38.8  MCV 89.2  PLT 305   Lipid Panel: Recent Labs    01/19/17 0806 05/23/17 0807 10/10/17 1032  CHOL 149 136 149  HDL 47* 43* 41*  LDLCALC 72 67 80  TRIG 201* 180* 185*  CHOLHDL 3.2 3.2 3.6   Lab Results  Component Value Date   HGBA1C 6.3 (H) 10/10/2017    Assessment/Plan 1. Need for influenza vaccination - Flu vaccine HIGH DOSE PF (Fluzone High dose)  2. Controlled type 2 diabetes mellitus with stage 3 chronic kidney disease, without long-term current use of insulin (HCC) - well controlled, but hba1c has been trending up and now 6.3 -she's going to be more mindful about being sure she does not miss her metformin and takes it with food -? Developing pancreatic insufficiency now also -  Basic metabolic panel; Future - Hemoglobin A1c; Future  3. CKD (chronic kidney disease) stage 3, GFR 30-59 ml/min (HCC) -Avoid nephrotoxic agents like nsaids, dose adjust  renally excreted meds, hydrate. -cont ARB  4. Overactive bladder - cont myrbetriq 25mg  samples--says it's so high to purchase, but oxybutynin has made her notably confused when she comes to appts so I don't feel comfortable giving her it again due to anticholinergic effects - Ambulatory referral to Gynecology  5. Mixed hyperlipidemia due to type 2 diabetes mellitus ([place]) -cont to work on dietary choices, continue walking   6. DDD (degenerative disc disease), lumbar -already saw ortho, cannot use nsaids with her diabetic ckd, continue tylenol at hs (recommend avoiding the benadryl with it but she prefers to take it), cont aspercreme with lidocaine during the day, also may use heating pad for 20 minute intervals separate from cream  7. Diabetic autonomic neuropathy associated with type 2 diabetes mellitus ([place]) -suspect this is what's causing her arm pain, but could be an odd impingement syndrome or tendonitis -she's going to pay more attention to what she's doing when she gets it  8. Change in bowel habits - seems consistent with pancreatic insufficiency with loose bms after meals and constipation other times - she initially thought it could be lactose intolerance but notes constipation with milk products sometimes making this unlikely -she's going to keep a food and bm diary to track this as best she can - CBC with Differential/Platelet; Future  -no blood in stools or abdominal pain  9. Palpitations -since her brand of metoprolol was changed -advised to check with pharmacy b/c previous brand "was working better" for her and she had no palpitations then  10.  Seborrheic keratoses -new on right leg and upper back -follow for ABCDs -if changing, will refer to derm  Labs/tests ordered:   Orders Placed This Encounter  Procedures  . Flu vaccine HIGH DOSE PF (Fluzone High dose)  . CBC with Differential/Platelet    Standing Status:   Future    Standing Expiration Date:   10/17/2018  .  Basic metabolic panel    Standing Status:   Future    Standing Expiration Date:   10/17/2018    Order Specific Question:   Has the patient fasted?    Answer:   Yes  . Hemoglobin A1c    Standing Status:   Future    Standing Expiration Date:   10/17/2018  . Ambulatory referral to Gynecology    Referral Priority:   Routine    Referral Type:   Consultation    Referral Reason:   Specialty Services Required    Requested Specialty:   Gynecology    Number of Visits Requested:   1   Next appt:  02/15/2018 for med mgt, fasting labs before -call sooner if any of above concerns worsen  Dariana Garbett L. Yaslene Lindamood, D.O. [facility] [address], [place] [address] Cell Phone (Mon-Fri 8am-5pm):  [phone] On Call:  [phone] & follow prompts after 5pm & weekends Office Phone:  [phone] Office Fax:  [phone]

## 2017-10-16 NOTE — Patient Instructions (Signed)
Track your bowel movements and what you've eaten.  Keep a diary of it.  We may need to replace your pancreatic enzymes.

## 2017-10-31 ENCOUNTER — Encounter (HOSPITAL_COMMUNITY): Payer: Self-pay | Admitting: Emergency Medicine

## 2017-10-31 ENCOUNTER — Emergency Department (HOSPITAL_COMMUNITY): Payer: Medicare Other

## 2017-10-31 ENCOUNTER — Emergency Department (HOSPITAL_COMMUNITY)
Admission: EM | Admit: 2017-10-31 | Discharge: 2017-10-31 | Disposition: A | Discharge status: Home / Self Care | Payer: Medicare Other | Attending: Emergency Medicine | Admitting: Emergency Medicine

## 2017-10-31 DIAGNOSIS — I129 Hypertensive chronic kidney disease with stage 1 through stage 4 chronic kidney disease, or unspecified chronic kidney disease: Secondary | ICD-10-CM | POA: Diagnosis not present

## 2017-10-31 DIAGNOSIS — Z87891 Personal history of nicotine dependence: Secondary | ICD-10-CM | POA: Diagnosis not present

## 2017-10-31 DIAGNOSIS — E119 Type 2 diabetes mellitus without complications: Secondary | ICD-10-CM | POA: Diagnosis not present

## 2017-10-31 DIAGNOSIS — N183 Chronic kidney disease, stage 3 (moderate): Secondary | ICD-10-CM | POA: Diagnosis not present

## 2017-10-31 DIAGNOSIS — Z79899 Other long term (current) drug therapy: Secondary | ICD-10-CM | POA: Diagnosis not present

## 2017-10-31 DIAGNOSIS — R079 Chest pain, unspecified: Secondary | ICD-10-CM | POA: Insufficient documentation

## 2017-10-31 LAB — BASIC METABOLIC PANEL
ANION GAP: 10 (ref 5–15)
BUN: 11 mg/dL (ref 8–23)
CHLORIDE: 106 mmol/L (ref 98–111)
CO2: 22 mmol/L (ref 22–32)
Calcium: 9.4 mg/dL (ref 8.9–10.3)
Creatinine, Ser: 1.04 mg/dL — ABNORMAL HIGH (ref 0.44–1.00)
GFR calc non Af Amer: 51 mL/min — ABNORMAL LOW (ref >60)
GFR, EST AFRICAN AMERICAN: 59 mL/min — AB (ref >60)
GLUCOSE: 97 mg/dL (ref 70–99)
POTASSIUM: 3.8 mmol/L (ref 3.5–5.1)
Sodium: 138 mmol/L (ref 135–145)

## 2017-10-31 LAB — D-DIMER, QUANTITATIVE (NOT AT ARMC): D DIMER QUANT: 1.47 ug{FEU}/mL — AB (ref 0.00–0.50)

## 2017-10-31 LAB — CBC
HCT: 41.4 % (ref 36.0–46.0)
Hemoglobin: 13.6 g/dL (ref 12.0–15.0)
MCH: 30.6 pg (ref 26.0–34.0)
MCHC: 32.9 g/dL (ref 30.0–36.0)
MCV: 93 fL (ref 80.0–100.0)
NRBC: 0 % (ref 0.0–0.2)
PLATELETS: 335 10*3/uL (ref 150–400)
RBC: 4.45 MIL/uL (ref 3.87–5.11)
RDW: 14 % (ref 11.5–15.5)
WBC: 6.8 10*3/uL (ref 4.0–10.5)

## 2017-10-31 LAB — TROPONIN I

## 2017-10-31 LAB — I-STAT TROPONIN, ED: Troponin i, poc: 0 ng/mL (ref 0.00–0.08)

## 2017-10-31 MED ORDER — IOPAMIDOL (ISOVUE-370) INJECTION 76%
INTRAVENOUS | Status: AC
  Filled 2017-10-31: qty 100

## 2017-10-31 MED ORDER — ACETAMINOPHEN 500 MG PO TABS
1000.0000 mg | ORAL_TABLET | Freq: Once | ORAL | Status: AC
  Administered 2017-10-31: 1000 mg via ORAL
  Filled 2017-10-31: qty 2

## 2017-10-31 MED ORDER — IOPAMIDOL (ISOVUE-370) INJECTION 76%
100.0000 mL | Freq: Once | INTRAVENOUS | Status: AC | PRN
  Administered 2017-10-31: 100 mL via INTRAVENOUS

## 2017-10-31 MED ORDER — LACTATED RINGERS IV BOLUS: 1000.0000 mL | Freq: Once | INTRAVENOUS | Status: DC

## 2017-10-31 NOTE — ED Notes (Signed)
Pt stable, ambulatory, states understanding of discharge instructions 

## 2017-10-31 NOTE — ED Triage Notes (Signed)
Pt states 1 hour ago she burped and now has chest pain. It was worse 1 hour ago. Pain is currently a 0/10. Pt endorses some dizziness currently. No shortness of breath.

## 2017-10-31 NOTE — ED Provider Notes (Signed)
Emergency Department Provider Note   I have reviewed the triage vital signs and the nursing notes.   HISTORY  Chief Complaint Chest Pain   HPI Mandy Perry is a 77 y.o. female with multiple medical problems documented below the presents to the emergency department today secondary to palpitations.  Patient was not doing anything in particular and had acute onset of a heavy heartbeat in her left chest she thought it might be related to her blood pressure so she checked it was 217/92.  She also had some shaking so she checked her blood sugar and it was 59 so she came straight to the emergency room.  Her symptoms have since resolved.  Her vital signs have improved.  She had a little bit of lightheadedness and headache with it.  No Leg swelling.  No recent travels. No other associated or modifying symptoms.    Past Medical History:  Diagnosis Date  . Benign neoplasm of colon   . Diabetes mellitus without complication (HCC)    K0U currently a goal  . Essential hypertension, benign   . GERD (gastroesophageal reflux disease)   . Hyperlipidemia    Followed by PCP  . Hypertension   . Lumbago   . Migraine, unspecified, without mention of intractable migraine without mention of status migrainosus   . Osteoarthrosis, unspecified whether generalized or localized, unspecified site   . Palpitations    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  . Solitary pulmonary nodule 2012  . Urinary frequency     Patient Active Problem List   Diagnosis Date Noted  . DDD (degenerative disc disease), lumbar 10/16/2017  . Change in bowel habits 10/16/2017  . B12 deficiency 06/08/2017  . Primary osteoarthritis of right knee 02/05/2017  . Overactive bladder 05/16/2016  . Senile osteopenia 05/16/2016  . Onychomycosis 05/16/2016  . 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 Heart palpitations 10/16/2013  . Gastroesophageal reflux disease with esophagitis 07/19/2013  . Controlled type 2 diabetes mellitus with stage 3  chronic kidney disease, without long-term current use of insulin ([place]) 07/19/2013  . CKD (chronic kidney disease) stage 3, GFR 30-59 ml/min (HCC) 07/19/2013  . Diabetic autonomic neuropathy associated with type 2 diabetes mellitus ([place]) 07/19/2013  . Mixed hyperlipidemia due to type 2 diabetes mellitus ([place]) 07/19/2013  . History of PSVT (paroxysmal supraventricular tachycardia) 10/11/2012  . Essential hypertension   . Diabetes mellitus without complication ([place])   . Hypercholesterolemia with hypertriglyceridemia     Past Surgical History:  Procedure Laterality Date  . ABDOMINAL HYSTERECTOMY    . CARDIAC CATHETERIZATION  11/15/1994   Patent coronary arteries and normal LV function  . CARDIAC CATHETERIZATION  11/03/2003   Normal coronary arteries  . COLONOSCOPY  2010   Dr.Bucinni, Normal   . COLONOSCOPY  2004   Dr.Bucinni  . EXERCISE STRESS TEST  11/12/1987   Positive  . TRANSTHORACIC ECHOCARDIOGRAM  05/19/2010   EF >55%, normal-mild    Current Outpatient Rx  . Order #: [id number] Class: Historical Med  . Order #: [id number] Class: Normal  . Order #: [id number] Class: Historical Med  . Order #: [id number] Class: Normal  . Order #: [id number] Class: Normal  . Order #: [id number] Class: Historical Med  . Order #: [id number] Class: Normal  . Order #: [id number] Class: Normal  . Order #: [id number] Class: Normal  . Order #: [id number] Class: Normal  . Order #: [id number] Class: Historical Med  . Order #: [id number] Class: Historical Med  . Order #: [id number] Class: Historical Med  . Order #: [id number] Class: Normal    Allergies Azithromycin;  Statins; Tramadol; and Gemfibrozil  Family History  Problem Relation Age of Onset  . CVA Mother   . Heart attack Father   . Cancer Father        Prostate cancer  . COPD Brother   . Diabetes Sister   . Colon cancer Sister   . Diabetes Sister   . Cancer Brother     Social History Social History   Tobacco Use  . Smoking status: Former Smoker     Packs/day: 0.50    Years: 20.00    Pack years: 10.00    Types: Cigarettes    Last attempt to quit: 01/10/1977    Years since quitting: 40.8  . Smokeless tobacco: Never Used  . Tobacco comment: Quit at age 72  Substance Use Topics  . Alcohol use: No  . Drug use: No    Review of Systems  All other systems negative except as documented in the HPI. All pertinent positives and negatives as reviewed in the HPI. ____________________________________________   PHYSICAL EXAM:  VITAL SIGNS: ED Triage Vitals  Enc Vitals Group     BP 10/31/17 1513 (!) 186/102     Pulse Rate 10/31/17 1513 (!) 110     Resp 10/31/17 1513 18     Temp 10/31/17 1513 98.6 F (37 C)     Temp Source 10/31/17 1730 Oral     SpO2 10/31/17 1513 98 %     Weight 10/31/17 1513 136 lb (61.7 kg)     Height 10/31/17 1513 5\' 5"  (1.651 m)    Constitutional: Alert and oriented. Well appearing and in no acute distress. Eyes: Conjunctivae are normal. PERRL. EOMI. Head: Atraumatic. Nose: No congestion/rhinnorhea. Mouth/Throat: Mucous membranes are moist.  Oropharynx non-erythematous. Neck: No stridor.  No meningeal signs.   Cardiovascular: tachycardic rate has improved from triage, regular rhythm. Good peripheral circulation. Grossly normal heart sounds.   Respiratory: Normal respiratory effort.  No retractions. Lungs CTAB. Gastrointestinal: Soft and nontender. No distention.  Musculoskeletal: No lower extremity tenderness nor edema. No gross deformities of extremities. Neurologic:  Normal speech and language. No gross focal neurologic deficits are appreciated.  Skin:  Skin is warm, dry and intact. No rash noted.   ____________________________________________   LABS (all labs ordered are listed, but only abnormal results are displayed)  Labs Reviewed  BASIC METABOLIC PANEL - Abnormal; Notable for the following components:      Result Value   Creatinine, Ser 1.04 (*)    GFR calc non Af Amer 51 (*)    GFR calc Af  Amer 59 (*)    All other components within normal limits  D-DIMER, QUANTITATIVE (NOT AT [facility]) - Abnormal; Notable for the following components:   D-Dimer, Quant 1.47 (*)    All other components within normal limits  CBC  TROPONIN I  I-STAT TROPONIN, ED   ____________________________________________  EKG   EKG Interpretation  Date/Time:  Tuesday October 31 2017 15:10:32 EDT Ventricular Rate:  110 PR Interval:  156 QRS Duration: 72 QT Interval:  350 QTC Calculation: 473 R Axis:   71 Text Interpretation:  Sinus tachycardia Possible Left atrial enlargement Nonspecific ST and T wave abnormality Abnormal ECG similar to earlier in day digitalis? Confirmed by Merrily Pew [phone]) on 10/31/2017 5:49:32 PM       ____________________________________________  RADIOLOGY  Dg Chest 2 View  Result Date: 10/31/2017 CLINICAL DATA:  77 year old female with chest pain, hypertensive. EXAM: CHEST - 2 VIEW COMPARISON:  Chest radiographs 04/07/2016 and earlier.  FINDINGS: Mildly lower lung volumes. Mediastinal contours are stable, heart size within normal limits mild tortuosity of the thoracic aorta. Visualized tracheal air column is within normal limits. Chronic 11 millimeter left upper lobe lung nodule is stable since a CT in 2006 and benign. No pneumothorax, pulmonary edema, pleural effusion or acute pulmonary opacity. No acute osseous abnormality identified. Negative visible bowel gas pattern. IMPRESSION: No acute cardiopulmonary abnormality. Benign left upper lobe lung nodule. Electronically Signed   By: Genevie Ann M.D.   On: 10/31/2017 15:55   Ct Angio Chest Pe W And/or Wo Contrast  Result Date: 10/31/2017 CLINICAL DATA:  Mild chest pain that began today. Elevated D-dimer. EXAM: CT ANGIOGRAPHY CHEST WITH CONTRAST TECHNIQUE: Multidetector CT imaging of the chest was performed using the standard protocol during bolus administration of intravenous contrast. Multiplanar CT image reconstructions and  MIPs were obtained to evaluate the vascular anatomy. CONTRAST:  113mL ISOVUE-370 IOPAMIDOL (ISOVUE-370) INJECTION 76% COMPARISON:  10/31/2017 chest radiograph. 08/22/2004 CT chest. FINDINGS: Cardiovascular: Normal heart size. No pericardial effusion. Normal caliber thoracic aorta. Mild aortic and coronary artery calcific atherosclerosis. Enlarged main pulmonary artery measuring up to 3.6 cm. Satisfactory opacification of the pulmonary arteries. Mediastinum/Nodes: No enlarged mediastinal, hilar, or axillary lymph nodes. Thyroid gland, trachea, and esophagus demonstrate no significant findings. Lungs/Pleura: Left upper lobe 13 x 11 mm pulmonary nodule (series 14 image 34) stable from 2006. Low-density internal foci may represent areas of fat indicating hamartoma. No consolidation, effusion, or pneumothorax. Upper Abdomen: No acute abnormality. Musculoskeletal: No chest wall abnormality. No acute or significant osseous findings. Review of the MIP images confirms the above findings. IMPRESSION: 1. No evidence of pulmonary embolism. 2. Enlarged main pulmonary artery measuring up to 3.6 cm, suggestive of pulmonary arterial hypertension. 3. Stable left upper lobe pulmonary nodule from 2006 compatible benign etiology. 4. Mild aortic and coronary artery calcific atherosclerosis. Electronically Signed   By: Kristine Garbe M.D.   On: 10/31/2017 21:00    ____________________________________________   PROCEDURES  Procedure(s) performed:   Procedures   ____________________________________________   INITIAL IMPRESSION / ASSESSMENT AND PLAN / ED COURSE  Symptoms resolved now heart rate 88.  Blood pressure improved.  Still has little bit of a headache.  Will give her Tylenol check a d-dimer and a second troponin and disposition as appropriate.  D dimer positive but cta negative for PE. Delta troponins negative making ACS unliekly. Will follow up with cardiologist for chest pain and question of pulmonary  hypertension.      Pertinent labs & imaging results that were available during my care of the patient were reviewed by me and considered in my medical decision making (see chart for details).  ____________________________________________  FINAL CLINICAL IMPRESSION(S) / ED DIAGNOSES  Final diagnoses:  Nonspecific chest pain     MEDICATIONS GIVEN DURING THIS VISIT:  Medications  lactated ringers bolus 1,000 mL (0 mLs Intravenous Hold 10/31/17 2104)  iopamidol (ISOVUE-370) 76 % injection (has no administration in time range)  iopamidol (ISOVUE-370) 76 % injection (has no administration in time range)  acetaminophen (TYLENOL) tablet 1,000 mg (1,000 mg Oral Given 10/31/17 1825)  iopamidol (ISOVUE-370) 76 % injection 100 mL (100 mLs Intravenous Contrast Given 10/31/17 2010)     NEW OUTPATIENT MEDICATIONS STARTED DURING THIS VISIT:  Discharge Medication List as of 10/31/2017 10:19 PM      Note:  This note was prepared with assistance of Dragon voice recognition software. Occasional wrong-word or sound-a-like substitutions may have occurred due to the inherent  limitations of voice recognition software.   Merrily Pew, MD 10/31/17 [phone]

## 2017-11-01 ENCOUNTER — Encounter: Payer: Self-pay | Admitting: Nurse Practitioner

## 2017-11-01 ENCOUNTER — Ambulatory Visit: Payer: Medicare Other | Admitting: Nurse Practitioner

## 2017-11-01 ENCOUNTER — Ambulatory Visit (INDEPENDENT_AMBULATORY_CARE_PROVIDER_SITE_OTHER): Payer: Medicare Other

## 2017-11-01 VITALS — BP 134/78 | HR 65 | Temp 98.7°F | Ht 65.0 in | Wt 138.6 lb

## 2017-11-01 VITALS — BP 134/78 | HR 93 | Temp 98.7°F | Ht 65.0 in | Wt 138.6 lb

## 2017-11-01 DIAGNOSIS — Z0000 Encounter for general adult medical examination without abnormal findings: Secondary | ICD-10-CM | POA: Diagnosis not present

## 2017-11-01 DIAGNOSIS — R194 Change in bowel habit: Secondary | ICD-10-CM

## 2017-11-01 DIAGNOSIS — E1122 Type 2 diabetes mellitus with diabetic chronic kidney disease: Secondary | ICD-10-CM

## 2017-11-01 DIAGNOSIS — R002 Palpitations: Secondary | ICD-10-CM

## 2017-11-01 DIAGNOSIS — I10 Essential (primary) hypertension: Secondary | ICD-10-CM | POA: Diagnosis not present

## 2017-11-01 DIAGNOSIS — N183 Chronic kidney disease, stage 3 (moderate): Secondary | ICD-10-CM

## 2017-11-01 NOTE — Patient Instructions (Signed)
Dr. Leonie Man Cardiologist in [place], [place] Address: [address], [place], [place] [address] phone: [phone]

## 2017-11-01 NOTE — Patient Instructions (Addendum)
Mandy Perry , Thank you for taking time to come for your Medicare Wellness Visit. I appreciate your ongoing commitment to your health goals. Please review the following plan we discussed and let me know if I can assist you in the future.   Screening recommendations/referrals: Colonoscopy excluded, over age 76 Mammogram excluded, over age 91 Bone Density up to date Recommended yearly ophthalmology/optometry visit for glaucoma screening and checkup Recommended yearly dental visit for hygiene and checkup  Vaccinations: Influenza vaccine up to date Pneumococcal vaccine up to date, completed Tdap vaccine up to date, due 09/02/2024 Shingles vaccine due, ordered to pharmacy  Advanced directives: in chart  Conditions/risks identified: none  Next appointment: Tyson Dense, RN 11/07/2018 @ 8:30am   Preventive Care 65 Years and Older, Female Preventive care refers to lifestyle choices and visits with your health care provider that can promote health and wellness. What does preventive care include?  A yearly physical exam. This is also called an annual well check.  Dental exams once or twice a year.  Routine eye exams. Ask your health care provider how often you should have your eyes checked.  Personal lifestyle choices, including:  Daily care of your teeth and gums.  Regular physical activity.  Eating a healthy diet.  Avoiding tobacco and drug use.  Limiting alcohol use.  Practicing safe sex.  Taking low-dose aspirin every day.  Taking vitamin and mineral supplements as recommended by your health care provider. What happens during an annual well check? The services and screenings done by your health care provider during your annual well check will depend on your age, overall health, lifestyle risk factors, and family history of disease. Counseling  Your health care provider may ask you questions about your:  Alcohol use.  Tobacco use.  Drug use.  Emotional  well-being.  Home and relationship well-being.  Sexual activity.  Eating habits.  History of falls.  Memory and ability to understand (cognition).  Work and work Statistician.  Reproductive health. Screening  You may have the following tests or measurements:  Height, weight, and BMI.  Blood pressure.  Lipid and cholesterol levels. These may be checked every 5 years, or more frequently if you are over 17 years old.  Skin check.  Lung cancer screening. You may have this screening every year starting at age 74 if you have a 30-pack-year history of smoking and currently smoke or have quit within the past 15 years.  Fecal occult blood test (FOBT) of the stool. You may have this test every year starting at age 65.  Flexible sigmoidoscopy or colonoscopy. You may have a sigmoidoscopy every 5 years or a colonoscopy every 10 years starting at age 43.  Hepatitis C blood test.  Hepatitis B blood test.  Sexually transmitted disease (STD) testing.  Diabetes screening. This is done by checking your blood sugar (glucose) after you have not eaten for a while (fasting). You may have this done every 1-3 years.  Bone density scan. This is done to screen for osteoporosis. You may have this done starting at age 5.  Mammogram. This may be done every 1-2 years. Talk to your health care provider about how often you should have regular mammograms. Talk with your health care provider about your test results, treatment options, and if necessary, the need for more tests. Vaccines  Your health care provider may recommend certain vaccines, such as:  Influenza vaccine. This is recommended every year.  Tetanus, diphtheria, and acellular pertussis (Tdap, Td) vaccine. You may  need a Td booster every 10 years.  Zoster vaccine. You may need this after age 60.  Pneumococcal 13-valent conjugate (PCV13) vaccine. One dose is recommended after age 31.  Pneumococcal polysaccharide (PPSV23) vaccine. One  dose is recommended after age 33. Talk to your health care provider about which screenings and vaccines you need and how often you need them. This information is not intended to replace advice given to you by your health care provider. Make sure you discuss any questions you have with your health care provider. Document Released: 01/23/2015 Document Revised: 09/16/2015 Document Reviewed: 10/28/2014 Elsevier Interactive Patient Education  2017 [place] Prevention in the Home Falls can cause injuries. They can happen to people of all ages. There are many things you can do to make your home safe and to help prevent falls. What can I do on the outside of my home?  Regularly fix the edges of walkways and driveways and fix any cracks.  Remove anything that might make you trip as you walk through a door, such as a raised step or threshold.  Trim any bushes or trees on the path to your home.  Use bright outdoor lighting.  Clear any walking paths of anything that might make someone trip, such as rocks or tools.  Regularly check to see if handrails are loose or broken. Make sure that both sides of any steps have handrails.  Any raised decks and porches should have guardrails on the edges.  Have any leaves, snow, or ice cleared regularly.  Use sand or salt on walking paths during winter.  Clean up any spills in your garage right away. This includes oil or grease spills. What can I do in the bathroom?  Use night lights.  Install grab bars by the toilet and in the tub and shower. Do not use towel bars as grab bars.  Use non-skid mats or decals in the tub or shower.  If you need to sit down in the shower, use a plastic, non-slip stool.  Keep the floor dry. Clean up any water that spills on the floor as soon as it happens.  Remove soap buildup in the tub or shower regularly.  Attach bath mats securely with double-sided non-slip rug tape.  Do not have throw rugs and other  things on the floor that can make you trip. What can I do in the bedroom?  Use night lights.  Make sure that you have a light by your bed that is easy to reach.  Do not use any sheets or blankets that are too big for your bed. They should not hang down onto the floor.  Have a firm chair that has side arms. You can use this for support while you get dressed.  Do not have throw rugs and other things on the floor that can make you trip. What can I do in the kitchen?  Clean up any spills right away.  Avoid walking on wet floors.  Keep items that you use a lot in easy-to-reach places.  If you need to reach something above you, use a strong step stool that has a grab bar.  Keep electrical cords out of the way.  Do not use floor polish or wax that makes floors slippery. If you must use wax, use non-skid floor wax.  Do not have throw rugs and other things on the floor that can make you trip. What can I do with my stairs?  Do not leave any items on  the stairs.  Make sure that there are handrails on both sides of the stairs and use them. Fix handrails that are broken or loose. Make sure that handrails are as long as the stairways.  Check any carpeting to make sure that it is firmly attached to the stairs. Fix any carpet that is loose or worn.  Avoid having throw rugs at the top or bottom of the stairs. If you do have throw rugs, attach them to the floor with carpet tape.  Make sure that you have a light switch at the top of the stairs and the bottom of the stairs. If you do not have them, ask someone to add them for you. What else can I do to help prevent falls?  Wear shoes that:  Do not have high heels.  Have rubber bottoms.  Are comfortable and fit you well.  Are closed at the toe. Do not wear sandals.  If you use a stepladder:  Make sure that it is fully opened. Do not climb a closed stepladder.  Make sure that both sides of the stepladder are locked into place.  Ask  someone to hold it for you, if possible.  Clearly mark and make sure that you can see:  Any grab bars or handrails.  First and last steps.  Where the edge of each step is.  Use tools that help you move around (mobility aids) if they are needed. These include:  Canes.  Walkers.  Scooters.  Crutches.  Turn on the lights when you go into a dark area. Replace any light bulbs as soon as they burn out.  Set up your furniture so you have a clear path. Avoid moving your furniture around.  If any of your floors are uneven, fix them.  If there are any pets around you, be aware of where they are.  Review your medicines with your doctor. Some medicines can make you feel dizzy. This can increase your chance of falling. Ask your doctor what other things that you can do to help prevent falls. This information is not intended to replace advice given to you by your health care provider. Make sure you discuss any questions you have with your health care provider. Document Released: 10/23/2008 Document Revised: 06/04/2015 Document Reviewed: 01/31/2014 Elsevier Interactive Patient Education  2017 [employer].

## 2017-11-01 NOTE — Progress Notes (Signed)
Subjective:   Mandy Perry is a 77 y.o. female who presents for Medicare Annual (Subsequent) preventive examination.  Last AWV-05/25/2016     Objective:     Vitals: BP 134/78 (BP Location: Left Arm, Patient Position: Sitting)   Pulse 65   Temp 98.7 F (37.1 C) (Oral)   Ht 5\' 5"  (1.651 m)   Wt 138 lb 9.6 oz (62.9 kg)   BMI 23.06 kg/m   Body mass index is 23.06 kg/m.  Advanced Directives 11/01/2017 11/01/2017 10/16/2017 01/23/2017 12/07/2016 09/22/2016 05/25/2016  Does Patient Have a Medical Advance Directive? Yes - Yes Yes Yes Yes Yes  Type of Paramedic of [place];Living will [place];Living will [place];Living will Living will [place];Living will [place];Living will [place];Living will  Does patient want to make changes to medical advance directive? No - Patient declined - No - Patient declined No - Patient declined - - No - Patient declined  Copy of [place] in Chart? Yes Yes Yes - - Yes Yes  Would patient like information on creating a medical advance directive? - - - No - Patient declined - - -  Pre-existing out of facility DNR order (yellow form or pink MOST form) - - - - - - -    Tobacco Social History   Tobacco Use  Smoking Status Former Smoker  . Packs/day: 0.50  . Years: 20.00  . Pack years: 10.00  . Types: Cigarettes  . Last attempt to quit: 01/10/1977  . Years since quitting: 40.8  Smokeless Tobacco Never Used  Tobacco Comment   Quit at age 33     Counseling given: Not Answered Comment: Quit at age 66   Clinical Intake:  Pre-visit preparation completed: No  Pain : No/denies pain     Diabetes: No  How often do you need to have someone help you when you read instructions, pamphlets, or other written materials from your doctor or pharmacy?: 1 - Never What is the last grade level you completed in school?:  Masters  Interpreter Needed?: No  Information entered by :: Tyson Dense, RN  Past Medical History:  Diagnosis Date  . Benign neoplasm of colon   . Diabetes mellitus without complication (HCC)    R6V currently a goal  . Essential hypertension, benign   . GERD (gastroesophageal reflux disease)   . Hyperlipidemia    Followed by PCP  . Hypertension   . Lumbago   . Migraine, unspecified, without mention of intractable migraine without mention of status migrainosus   . Osteoarthrosis, unspecified whether generalized or localized, unspecified site   . Palpitations    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  . Solitary pulmonary nodule 2012  . Urinary frequency    Past Surgical History:  Procedure Laterality Date  . ABDOMINAL HYSTERECTOMY    . CARDIAC CATHETERIZATION  11/15/1994   Patent coronary arteries and normal LV function  . CARDIAC CATHETERIZATION  11/03/2003   Normal coronary arteries  . COLONOSCOPY  2010   Dr.Bucinni, Normal   . COLONOSCOPY  2004   Dr.Bucinni  . EXERCISE STRESS TEST  11/12/1987   Positive  . TRANSTHORACIC ECHOCARDIOGRAM  05/19/2010   EF >55%, normal-mild   Family History  Problem Relation Age of Onset  . CVA Mother   . Heart attack Father   . Cancer Father        Prostate cancer  . COPD Brother   .  Diabetes Sister   . Colon cancer Sister   . Diabetes Sister   . Cancer Brother    Social History   Socioeconomic History  . Marital status: Widowed    Spouse name: Not on file  . Number of children: Not on file  . Years of education: Not on file  . Highest education level: Not on file  Occupational History  . Occupation: retired Marine scientist  Social Needs  . Financial resource strain: Not hard at all  . Food insecurity:    Worry: Never true    Inability: Never true  . Transportation needs:    Medical: No    Non-medical: No  Tobacco Use  . Smoking status: Former Smoker    Packs/day: 0.50    Years: 20.00    Pack years: 10.00    Types: Cigarettes     Last attempt to quit: 01/10/1977    Years since quitting: 40.8  . Smokeless tobacco: Never Used  . Tobacco comment: Quit at age 89  Substance and Sexual Activity  . Alcohol use: No  . Drug use: No  . Sexual activity: Not Currently  Lifestyle  . Physical activity:    Days per week: 0 days    Minutes per session: 0 min  . Stress: Only a little  Relationships  . Social connections:    Talks on phone: More than three times a week    Gets together: More than three times a week    Attends religious service: Never    Active member of club or organization: No    Attends meetings of clubs or organizations: Never    Relationship status: Widowed  Other Topics Concern  . Not on file  Social History Narrative   She is a married,  Widowed -husband died 2014-04-22   Retired Marine scientist    mother of 4, grandmother 23.    She had 10 years a history of about a half pack a day but quit 10 years ago.    Her exercising is limited by her osteoarthritis pains. But she tries to do some of the exercises with walking on a daily basis.   Alcohol none    Outpatient Encounter Medications as of 11/01/2017  Medication Sig  . acetaminophen (TYLENOL) 500 MG tablet Take 500 mg by mouth as needed for pain.  Marland Kitchen amLODipine (NORVASC) 2.5 MG tablet Take 1 tablet (2.5 mg total) by mouth at bedtime.  Marland Kitchen aspirin 325 MG tablet Take 162.5 mg by mouth once.   . Cholecalciferol (VITAMIN D3) 2000 units CHEW Chew 1 capsule by mouth daily.  Marland Kitchen conjugated estrogens (PREMARIN) vaginal cream Pea sized amt PV 2 times per week for atrophic vaginitis  . losartan (COZAAR) 50 MG tablet Take 50 mg by mouth daily.  Marland Kitchen lovastatin (MEVACOR) 20 MG tablet TAKE ONE TABLET BY MOUTH AT BEDTIME  . metFORMIN (GLUCOPHAGE) 1000 MG tablet TAKE ONE TABLET BY MOUTH TWICE DAILY with meals  . metoprolol tartrate (LOPRESSOR) 100 MG tablet take ONE-HALF tablet (50mg ) BY MOUTH TWICE DAILY  . mirabegron ER (MYRBETRIQ) 25 MG TB24 tablet Take 1 tablet (25 mg total) by  mouth daily.  . Multiple Vitamin (MULTIVITAMIN) tablet Take 1 tablet by mouth daily.   . Omega-3 1000 MG CAPS Take 1 g by mouth.  . vitamin B-12 (CYANOCOBALAMIN) 1000 MCG tablet Take 1,000 mcg by mouth daily.   No facility-administered encounter medications on file as of 11/01/2017.     Activities of Daily Living In your present  state of health, do you have any difficulty performing the following activities: 11/01/2017  Hearing? N  Vision? N  Difficulty concentrating or making decisions? N  Walking or climbing stairs? N  Dressing or bathing? N  Doing errands, shopping? N  Preparing Food and eating ? N  Using the Toilet? N  In the past six months, have you accidently leaked urine? N  Do you have problems with loss of bowel control? N  Managing your Medications? N  Managing your Finances? N  Housekeeping or managing your Housekeeping? N  Some recent data might be hidden    Patient Care Team: Gayland Curry, DO as PCP - General (Geriatric Medicine) Leonie Man, MD as Consulting Physician (Cardiology)    Assessment:   This is a routine wellness examination for [place].  Exercise Activities and Dietary recommendations Current Exercise Habits: The patient does not participate in regular exercise at present  Goals    . Exercise 3x per week (30 min per time)     Starting today pt will return to her previous routine of walking        Fall Risk Fall Risk  11/01/2017 11/01/2017 10/16/2017 05/25/2017 12/07/2016  Falls in the past year? No No No No No  Comment - - - - -  Number falls in past yr: - - - - -  Injury with Fall? - - - - -   Is the patient's home free of loose throw rugs in walkways, pet beds, electrical cords, etc?   yes      Grab bars in the bathroom? yes      Handrails on the stairs?   yes      Adequate lighting?   yes  Depression Screen PHQ 2/9 Scores 11/01/2017 10/16/2017 05/25/2017 09/22/2016  PHQ - 2 Score 0 0 0 0  PHQ- 9 Score - - - -     Cognitive  Function MMSE - Mini Mental State Exam 11/01/2017 05/16/2016 05/16/2016 03/19/2015 07/19/2013  Orientation to time 5 5 5 5 5   Orientation to Place 5 5 5 5 5   Registration 3 3 3 3 3   Attention/ Calculation 5 5 5 5 5   Recall 3 2 2 2 2   Language- name 2 objects 2 2 2 2 2   Language- repeat 1 1 1 1 1   Language- follow 3 step command 3 3 3 3 3   Language- read & follow direction 1 1 1 1 1   Write a sentence 1 1 1 1 1   Copy design 1 1 1 1 1   Total score 30 29 29 29 29         Immunization History  Administered Date(s) Administered  . Influenza, High Dose Seasonal PF 09/22/2016, 10/16/2017  . Influenza,inj,Quad PF,6+ Mos 11/20/2012, 10/03/2013, 11/20/2014  . Influenza-Unspecified 10/07/2010, 11/03/2011, 11/21/2015  . Pneumococcal Conjugate-13 12/31/2012  . Pneumococcal Polysaccharide-23 11/20/2014  . Tdap 09/03/2014    Qualifies for Shingles Vaccine? Yes, educated and ordered to pharmacy  Screening Tests Health Maintenance  Topic Date Due  . OPHTHALMOLOGY EXAM  06/28/2016  . FOOT EXAM  01/23/2018  . HEMOGLOBIN A1C  04/11/2018  . TETANUS/TDAP  09/02/2024  . INFLUENZA VACCINE  Completed  . DEXA SCAN  Completed  . PNA vac Low Risk Adult  Completed    Cancer Screenings: Lung: Low Dose CT Chest recommended if Age 62-80 years, 30 pack-year currently smoking OR have quit w/in 15years. Patient does not qualify. Breast:  Up to date on Mammogram? Yes  Up to date of Bone Density/Dexa? Yes Colorectal: up to date  Additional Screenings:  Hepatitis C Screening: declined     Plan:    I have personally reviewed and addressed the Medicare Annual Wellness questionnaire and have noted the following in the patient's chart:  A. Medical and social history B. Use of alcohol, tobacco or illicit drugs  C. Current medications and supplements D. Functional ability and status E.  Nutritional status F.  Physical activity G. Advance directives H. List of other physicians I.  Hospitalizations,  surgeries, and ER visits in previous 12 months J.  [place] to include hearing, vision, cognitive, depression L. Referrals and appointments - none  In addition, I have reviewed and discussed with patient certain preventive protocols, quality metrics, and best practice recommendations. A written personalized care plan for preventive services as well as general preventive health recommendations were provided to patient.  See attached scanned questionnaire for additional information.   Signed,   Tyson Dense, RN Nurse Health Advisor  Patient Concerns: None

## 2017-11-01 NOTE — Progress Notes (Signed)
Careteam: Patient Care Team: Gayland Curry, DO as PCP - General (Geriatric Medicine) Leonie Man, MD as Consulting Physician (Cardiology)  Advanced Directive information Type of Advance Directive: [place];Living will  Allergies  Allergen Reactions  . Azithromycin Hives  . Statins     Myalgias-high doses  . Tramadol Nausea Only  . Gemfibrozil Other (See Comments)    Leg cramps at night    Chief Complaint  Patient presents with  . Follow-up    Pt is being seen to follow up on [facility] ED visit on 10/31/17 for chest pain. Pt reports that she is no longer having symptoms.      HPI: Patient is a 77 y.o. female seen in the office today to follow up ED visit yesterday. Pt went to ED after finding her blood pressure to be 217/92 at home and blood sugar of 52. Once she got to the ED her vital signs improved and sh was without symptoms of palpitations (which prompted her to take BP). She had a little bit of lightheadedness and headache. Noted to have pulmonary hypertension on CT and plans to follow up with cardiologist. No shortness of breath or chest pain noted. Pt reports she has a cardiologist and plans to call and make one.   Reports her blood pressure is higher at home then here. Then states it was before she took her medication.   Unsure why her blood sugar dropped, stated she had eaten. Never had low blood sugar before. This morning fasting blood sugar was 150.  Holding metformin for 48 hours after CT   Woke up with a headache but it has improved now- did not take tylenol because it was not bad enough.  Review of Systems:  Review of Systems  Constitutional: Negative for chills, fever, malaise/fatigue and weight loss.       Not as energetic as she used to be  HENT: Negative for congestion and hearing loss.   Eyes: Negative for blurred vision.  Respiratory: Negative for cough and shortness of breath.   Cardiovascular: Positive for palpitations.  Negative for chest pain, orthopnea, leg swelling and PND.  Gastrointestinal: Positive for diarrhea. Negative for abdominal pain, blood in stool, constipation and melena.  Genitourinary: Positive for frequency and urgency. Negative for dysuria, flank pain and hematuria.  Musculoskeletal: Negative for back pain, falls and joint pain.  Neurological: Negative for dizziness, focal weakness and loss of consciousness.  Psychiatric/Behavioral: Positive for memory loss. Negative for depression. The patient is not nervous/anxious and does not have insomnia.    Past Medical History:  Diagnosis Date  . Benign neoplasm of colon   . Diabetes mellitus without complication (HCC)    V4U currently a goal  . Essential hypertension, benign   . GERD (gastroesophageal reflux disease)   . Hyperlipidemia    Followed by PCP  . Hypertension   . Lumbago   . Migraine, unspecified, without mention of intractable migraine without mention of status migrainosus   . Osteoarthrosis, unspecified whether generalized or localized, unspecified site   . Palpitations    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  . Solitary pulmonary nodule 2012  . Urinary frequency    Past Surgical History:  Procedure Laterality Date  . ABDOMINAL HYSTERECTOMY    . CARDIAC CATHETERIZATION  11/15/1994   Patent coronary arteries and normal LV function  . CARDIAC CATHETERIZATION  11/03/2003   Normal coronary arteries  . COLONOSCOPY  2010   Dr.Bucinni, Normal   . COLONOSCOPY  2004   Dr.Bucinni  . EXERCISE STRESS TEST  11/12/1987   Positive  . TRANSTHORACIC ECHOCARDIOGRAM  05/19/2010   EF >55%, normal-mild   Social History:   reports that she quit smoking about 40 years ago. Her smoking use included cigarettes. She has a 10.00 pack-year smoking history. She has never used smokeless tobacco. She reports that she does not drink alcohol or use drugs.  Family History  Problem Relation Age of Onset  . CVA Mother   . Heart attack Father   . Cancer  Father        Prostate cancer  . COPD Brother   . Diabetes Sister   . Colon cancer Sister   . Diabetes Sister   . Cancer Brother     Medications: Patient's Medications  New Prescriptions   No medications on file  Previous Medications   ACETAMINOPHEN (TYLENOL) 500 MG TABLET    Take 500 mg by mouth as needed for pain.   AMLODIPINE (NORVASC) 2.5 MG TABLET    Take 1 tablet (2.5 mg total) by mouth at bedtime.   ASPIRIN 325 MG TABLET    Take 162.5 mg by mouth once.    CHOLECALCIFEROL (VITAMIN D3) 2000 UNITS CHEW    Chew 1 capsule by mouth daily.   CONJUGATED ESTROGENS (PREMARIN) VAGINAL CREAM    Pea sized amt PV 2 times per week for atrophic vaginitis   LOSARTAN (COZAAR) 50 MG TABLET    Take 50 mg by mouth daily.   LOVASTATIN (MEVACOR) 20 MG TABLET    TAKE ONE TABLET BY MOUTH AT BEDTIME   METFORMIN (GLUCOPHAGE) 1000 MG TABLET    TAKE ONE TABLET BY MOUTH TWICE DAILY with meals   METOPROLOL TARTRATE (LOPRESSOR) 100 MG TABLET    take ONE-HALF tablet (50mg ) BY MOUTH TWICE DAILY   MIRABEGRON ER (MYRBETRIQ) 25 MG TB24 TABLET    Take 1 tablet (25 mg total) by mouth daily.   MULTIPLE VITAMIN (MULTIVITAMIN) TABLET    Take 1 tablet by mouth daily.    OMEGA-3 1000 MG CAPS    Take 1 g by mouth.   VITAMIN B-12 (CYANOCOBALAMIN) 1000 MCG TABLET    Take 1,000 mcg by mouth daily.  Modified Medications   No medications on file  Discontinued Medications   IRBESARTAN (AVAPRO) 150 MG TABLET    Take 1 tablet (150 mg total) by mouth daily.     Physical Exam:  Vitals:   11/01/17 1052  BP: 134/78  Pulse: 93  Temp: 98.7 F (37.1 C)  TempSrc: Oral  SpO2: 98%  Weight: 138 lb 9.6 oz (62.9 kg)  Height: 5\' 5"  (1.651 m)   Body mass index is 23.06 kg/m.  Physical Exam  Constitutional: She is oriented to person, place, and time. She appears well-developed and well-nourished. No distress.  HENT:  Head: Normocephalic and atraumatic.  Eyes:  glasses  Cardiovascular: Normal rate, regular rhythm, normal  heart sounds and intact distal pulses.  Pulmonary/Chest: Effort normal and breath sounds normal. No respiratory distress.  Abdominal: Soft. Bowel sounds are normal.  Musculoskeletal: Normal range of motion. She exhibits no tenderness.  Neurological: She is alert and oriented to person, place, and time. No cranial nerve deficit.  Skin: Skin is warm and dry.  Psychiatric: She has a normal mood and affect.    Labs reviewed: Basic Metabolic Panel: Recent Labs    05/23/17 0807 10/10/17 1032 10/31/17 1517  NA 140 139 138  K 4.1 4.2 3.8  CL 104 104  106  CO2 28 25 22   GLUCOSE 104* 105* 97  BUN 18 16 11   CREATININE 1.02* 1.08* 1.04*  CALCIUM 9.7 9.4 9.4  TSH 2.18  --   --    Liver Function Tests: Recent Labs    12/07/16 1045 01/19/17 0806  AST 13 13  ALT 12 12  BILITOT 0.3 0.3  PROT 7.0 7.4   No results for input(s): LIPASE, AMYLASE in the last 8760 hours. No results for input(s): AMMONIA in the last 8760 hours. CBC: Recent Labs    01/19/17 0806 10/31/17 1517  WBC 5.1 6.8  NEUTROABS 2,525  --   HGB 13.2 13.6  HCT 38.8 41.4  MCV 89.2 93.0  PLT 305 335   Lipid Panel: Recent Labs    01/19/17 0806 05/23/17 0807 10/10/17 1032  CHOL 149 136 149  HDL 47* 43* 41*  LDLCALC 72 67 80  TRIG 201* 180* 185*  CHOLHDL 3.2 3.2 3.6   TSH: Recent Labs    05/23/17 0807  TSH 2.18   A1C: Lab Results  Component Value Date   HGBA1C 6.3 (H) 10/10/2017   Dg Chest 2 View  Result Date: 10/31/2017 CLINICAL DATA:  77 year old female with chest pain, hypertensive. EXAM: CHEST - 2 VIEW COMPARISON:  Chest radiographs 04/07/2016 and earlier. FINDINGS: Mildly lower lung volumes. Mediastinal contours are stable, heart size within normal limits mild tortuosity of the thoracic aorta. Visualized tracheal air column is within normal limits. Chronic 11 millimeter left upper lobe lung nodule is stable since a CT in 2006 and benign. No pneumothorax, pulmonary edema, pleural effusion or acute  pulmonary opacity. No acute osseous abnormality identified. Negative visible bowel gas pattern. IMPRESSION: No acute cardiopulmonary abnormality. Benign left upper lobe lung nodule. Electronically Signed   By: Genevie Ann M.D.   On: 10/31/2017 15:55   Ct Angio Chest Pe W And/or Wo Contrast  Result Date: 10/31/2017 CLINICAL DATA:  Mild chest pain that began today. Elevated D-dimer. EXAM: CT ANGIOGRAPHY CHEST WITH CONTRAST TECHNIQUE: Multidetector CT imaging of the chest was performed using the standard protocol during bolus administration of intravenous contrast. Multiplanar CT image reconstructions and MIPs were obtained to evaluate the vascular anatomy. CONTRAST:  161mL ISOVUE-370 IOPAMIDOL (ISOVUE-370) INJECTION 76% COMPARISON:  10/31/2017 chest radiograph. 08/22/2004 CT chest. FINDINGS: Cardiovascular: Normal heart size. No pericardial effusion. Normal caliber thoracic aorta. Mild aortic and coronary artery calcific atherosclerosis. Enlarged main pulmonary artery measuring up to 3.6 cm. Satisfactory opacification of the pulmonary arteries. Mediastinum/Nodes: No enlarged mediastinal, hilar, or axillary lymph nodes. Thyroid gland, trachea, and esophagus demonstrate no significant findings. Lungs/Pleura: Left upper lobe 13 x 11 mm pulmonary nodule (series 14 image 34) stable from 2006. Low-density internal foci may represent areas of fat indicating hamartoma. No consolidation, effusion, or pneumothorax. Upper Abdomen: No acute abnormality. Musculoskeletal: No chest wall abnormality. No acute or significant osseous findings. Review of the MIP images confirms the above findings. IMPRESSION: 1. No evidence of pulmonary embolism. 2. Enlarged main pulmonary artery measuring up to 3.6 cm, suggestive of pulmonary arterial hypertension. 3. Stable left upper lobe pulmonary nodule from 2006 compatible benign etiology. 4. Mild aortic and coronary artery calcific atherosclerosis. Electronically Signed   By: Kristine Garbe M.D.   On: 10/31/2017 21:00     Assessment/Plan 1. Controlled type 2 diabetes mellitus with stage 3 chronic kidney disease, without long-term current use of insulin (HCC) Has been holding metformin after CT, discussed limiting sugar/sweet intake especially during this time. Unable to explain  low blood sugar yesterday but may need further investigation/change in medication if continues to occur.   2. Essential hypertension Improved at this time. Will continue current regimen.  3. Palpitations Resolved at this time. Plans to follow up with cardiology after CT suggestive of pulmonary arterial hypertension  4. Change in bowel habits -plans to add fiber to diet. 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  Carlos American. [place], [facility] [phone]

## 2017-11-17 ENCOUNTER — Ambulatory Visit: Payer: Medicare Other | Admitting: Obstetrics & Gynecology

## 2017-11-17 ENCOUNTER — Encounter: Payer: Self-pay | Admitting: Obstetrics & Gynecology

## 2017-11-17 VITALS — BP 126/78 | Ht 64.5 in | Wt 136.0 lb

## 2017-11-17 DIAGNOSIS — R350 Frequency of micturition: Secondary | ICD-10-CM

## 2017-11-17 DIAGNOSIS — R3913 Splitting of urinary stream: Secondary | ICD-10-CM | POA: Diagnosis not present

## 2017-11-17 NOTE — Patient Instructions (Signed)
1. Urination frequency Will rule out cystitis.  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  No evidence of cystocele.  No urge urinary incontinence or stress urinary incontinence.  Not improved on Myrbetriq/expensive, therefore patient stopped it a week ago.  Consuming large amounts of caffeine every day.  Will start with cessation of caffeine products for a week and replacing by water.  If the bladder function improves with that measure, will continue with decreased caffeine consumption.  If no change patient will follow-up with me to consider other treatments.  Continue with Premarin cream especially around the urethra twice a week.  2. Intermittent urinary stream As above.  Follow-up annual gynecologic exam.  Koi, it was a pleasure meeting you today!  I will inform you of your results as soon as they are available.

## 2017-11-17 NOTE — Progress Notes (Addendum)
    Mandy Perry 10-12-1940 [id number]        77 y.o.  G3P3L3 Widowed.  Boyfriend  RP: Urinary frequency and intermittent flow when urinating  HPI:  Total Hysterectomy for Fibroids.  C/O urinary frequency with no burning.  When passing urine, patient has good flow at first but then it becomes intermittent. Started Premarin cream at vulva/vagina twice a week in 09/2017. Seen by urology.  Treated with Myrbetriq, but patient felt no improvement and the medication was expensive, she therefore stopped a week ago.  Patient is drinking many cups of coffee every day and occasional tea.  She enjoys chocolate.  No urge incontinence or stress incontinence.  No recurrent urinary tract infections.  No fever.  No pelvic pain and no postmenopausal bleeding.   OB History  Gravida Para Term Preterm AB Living  3 3       3   SAB TAB Ectopic Multiple Live Births               # Outcome Date GA Lbr Len/2nd Weight Sex Delivery Anes PTL Lv  3 Para           2 Para           1 Para             Past medical history,surgical history, problem list, medications, allergies, family history and social history were all reviewed and documented in the EPIC chart.   Directed ROS with pertinent positives and negatives documented in the history of present illness/assessment and plan.  Exam:  Vitals:   11/17/17 1122  BP: 126/78  Weight: 136 lb (61.7 kg)  Height: 5' 4.5" (1.638 m)   General appearance:  Normal  CVAT negative bilaterally  Abdomen: Normal  Gynecologic exam: Vulva normal.  Bimanual exam: Status post total hysterectomy.  No pelvic mass and no tenderness.  No colpocele or cystorectocele.  U/A: Yellow clear, nitrites negative, white blood cells 0-5, red blood cells negative, bacteria few.  Urine culture pending.   Assessment/Plan:  77 y.o. G3P3   1. Urination frequency Will rule out cystitis.  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  No evidence of cystocele.  No urge urinary incontinence or  stress urinary incontinence.  Not improved on Myrbetriq/expensive, therefore patient stopped it a week ago.  Consuming large amounts of caffeine every day.  Will start with cessation of caffeine products for a week and replacing by water.  If the bladder function improves with that measure, will continue with decreased caffeine consumption.  If no change patient will follow-up with me to consider other treatments.  Continue with Premarin cream especially around the urethra twice a week.  2. Intermittent urinary stream As above.  Follow-up annual gynecologic exam.  Counseling on above issues and coordination of care more than 50% for 30 minutes.   Princess Bruins MD, 11:35 AM 11/17/2017

## 2017-11-19 LAB — URINALYSIS, COMPLETE W/RFL CULTURE
BILIRUBIN URINE: NEGATIVE
Glucose, UA: NEGATIVE
Hgb urine dipstick: NEGATIVE
Hyaline Cast: NONE SEEN /LPF
Ketones, ur: NEGATIVE
Leukocyte Esterase: NEGATIVE
Nitrites, Initial: NEGATIVE
RBC / HPF: NONE SEEN /HPF (ref 0–2)
SPECIFIC GRAVITY, URINE: 1.01 (ref 1.001–1.03)
pH: 5.5 (ref 5.0–8.0)

## 2017-11-19 LAB — URINE CULTURE
MICRO NUMBER:: 91352957
Result:: NO GROWTH
SPECIMEN QUALITY: ADEQUATE

## 2017-11-19 LAB — CULTURE INDICATED

## 2017-12-12 ENCOUNTER — Other Ambulatory Visit: Payer: Self-pay | Admitting: Internal Medicine

## 2017-12-12 MED ORDER — LOSARTAN POTASSIUM 50 MG PO TABS: 50.0000 mg | ORAL_TABLET | Freq: Every day | ORAL | 1 refills | Status: DC

## 2017-12-13 NOTE — Progress Notes (Signed)
Cardiology Office Note   Date:  12/14/2017   ID:  Mandy STRADFORD, DOB 02/03/1940, MRN [id number]  PCP:  Mandy Curry, DO  Cardiologist:  Elizabeth  Chief Complaint  Patient presents with  . Palpitations  . Hypertension     History of Present Illness: Mandy Perry is a 77 y.o. female who presents for ongoing assessment and management of SVT, with last office visit on 09/28/2017 after 3 years lost to follow up. At that visit she did not report any recurrent rapid irregular heart beats.   Today she states that her racing heart rate has come back She is feeling it at least twice a day. Causes dyspnea.  States can last for 10 seconds to several minutes. She states for a long time she did not have any issues. She has given up all caffeine   Past Medical History:  Diagnosis Date  . Benign neoplasm of colon   . Diabetes mellitus without complication (HCC)    K8L currently a goal  . Essential hypertension, benign   . GERD (gastroesophageal reflux disease)   . Hyperlipidemia    Followed by PCP  . Hypertension   . Lumbago   . Migraine, unspecified, without mention of intractable migraine without mention of status migrainosus   . Osteoarthrosis, unspecified whether generalized or localized, unspecified site   . Palpitations    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  . Solitary pulmonary nodule 2012  . Urinary frequency     Past Surgical History:  Procedure Laterality Date  . ABDOMINAL HYSTERECTOMY    . CARDIAC CATHETERIZATION  11/15/1994   Patent coronary arteries and normal LV function  . CARDIAC CATHETERIZATION  11/03/2003   Normal coronary arteries  . COLONOSCOPY  2010   Dr.Bucinni, Normal   . COLONOSCOPY  2004   Dr.Bucinni  . EXERCISE STRESS TEST  11/12/1987   Positive  . SHOULDER SURGERY Right   . TRANSTHORACIC ECHOCARDIOGRAM  05/19/2010   EF >55%, normal-mild     Current Outpatient Medications  Medication Sig Dispense Refill  . acetaminophen (TYLENOL) 500 MG tablet Take 500  mg by mouth as needed for pain.    Marland Kitchen amLODipine (NORVASC) 2.5 MG tablet Take 1 tablet (2.5 mg total) by mouth at bedtime. 90 tablet 3  . aspirin EC 81 MG tablet Take 81 mg by mouth daily.    . Cholecalciferol (VITAMIN D3) 2000 units CHEW Chew 1 capsule by mouth daily. 90 tablet 3  . conjugated estrogens (PREMARIN) vaginal cream Pea sized amt PV 2 times per week for atrophic vaginitis 42.5 g 3  . losartan (COZAAR) 50 MG tablet Take 1 tablet (50 mg total) by mouth daily. 90 tablet 1  . lovastatin (MEVACOR) 20 MG tablet TAKE ONE TABLET BY MOUTH AT BEDTIME 90 tablet 1  . metFORMIN (GLUCOPHAGE) 1000 MG tablet TAKE ONE TABLET BY MOUTH TWICE DAILY with meals 180 tablet 1  . metoprolol tartrate (LOPRESSOR) 100 MG tablet Take 1 tablet (100 mg total) by mouth 2 (two) times daily. 100mg  in the am and 50mg  in the pm 60 tablet 6  . mirabegron ER (MYRBETRIQ) 25 MG TB24 tablet Take 1 tablet (25 mg total) by mouth daily. 30 tablet 3  . Multiple Vitamin (MULTIVITAMIN) tablet Take 1 tablet by mouth daily.     . Omega-3 1000 MG CAPS Take 1 g by mouth.    . vitamin B-12 (CYANOCOBALAMIN) 1000 MCG tablet Take 1,000 mcg by mouth daily.     No current facility-administered  medications for this visit.     Allergies:   Azithromycin; Statins; Tramadol; and Gemfibrozil    Social History:  The patient  reports that she quit smoking about 40 years ago. Her smoking use included cigarettes. She has a 10.00 pack-year smoking history. She has never used smokeless tobacco. She reports that she does not drink alcohol or use drugs.   Family History:  The patient's family history includes COPD in her brother; CVA in her mother; Cancer in her brother and father; Colon cancer in her sister; Diabetes in her sister and sister; Heart attack in her father.    ROS: All other systems are reviewed and negative. Unless otherwise mentioned in H&P    PHYSICAL EXAM: VS:  BP 126/80   Pulse 70   Ht 5' 4.5" (1.638 m)   Wt 136 lb (61.7  kg)   SpO2 94%   BMI 22.98 kg/m  , BMI Body mass index is 22.98 kg/m. GEN: Well nourished, well developed, in no acute distress HEENT: normal Neck: no JVD, carotid bruits, or masses Cardiac: RRR; no murmurs, rubs, or gallops,no edema  Respiratory:  Clear to auscultation bilaterally, normal work of breathing GI: soft, nontender, nondistended, + BS MS: no deformity or atrophy Skin: warm and dry, no rash Neuro:  Strength and sensation are intact Psych: euthymic mood, full affect   EKG: NSR with LVH, rate of 70 bpm.   Recent Labs: 01/19/2017: ALT 12 05/23/2017: TSH 2.18 10/31/2017: BUN 11; Creatinine, Ser 1.04; Hemoglobin 13.6; Platelets 335; Potassium 3.8; Sodium 138    Lipid Panel    Component Value Date/Time   CHOL 149 10/10/2017 1032   CHOL 139 11/17/2014 0944   TRIG 185 (H) 10/10/2017 1032   HDL 41 (L) 10/10/2017 1032   HDL 40 11/17/2014 0944   CHOLHDL 3.6 10/10/2017 1032   VLDL 29 05/04/2016 0818   LDLCALC 80 10/10/2017 1032      Wt Readings from Last 3 Encounters:  12/14/17 136 lb (61.7 kg)  11/17/17 136 lb (61.7 kg)  11/01/17 138 lb 9.6 oz (62.9 kg)     Other studies Reviewed: Echo in 2012 with normal LV function. (Scanned report).  ASSESSMENT AND PLAN:  1. 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 I will go up on the metoprolol to 100 mg in the am and continue metoprolol 50 mg daily. I would like to place a cardiac monitor to ascertain frequency and morphology of her symptoms. She refuses this.   2. Hypertension: BP is well controlled. I have asked her to take her BP at home. If BP is < 503 systolic or HR <54 on higher dose of metoprolol, she is to call us. I will take away amlodipine dose if BP drops. I will see her in a couple of weeks for follow up on her response to medications.   3. Diabetes: Continue to be followed by PCP   Current medicines are reviewed at length with the patient today.    Labs/ tests ordered today include:  None Phill Myron. [place], ANP, [facility]   12/14/2017 10:42 AM    [address] Office [phone] Fax [phone]

## 2017-12-14 ENCOUNTER — Ambulatory Visit: Payer: Medicare Other | Admitting: Adult Health

## 2017-12-14 ENCOUNTER — Encounter: Payer: Self-pay | Admitting: Adult Health

## 2017-12-14 VITALS — BP 126/80 | HR 70 | Ht 64.5 in | Wt 136.0 lb

## 2017-12-14 DIAGNOSIS — I10 Essential (primary) hypertension: Secondary | ICD-10-CM

## 2017-12-14 DIAGNOSIS — Z8679 Personal history of other diseases of the circulatory system: Secondary | ICD-10-CM | POA: Diagnosis not present

## 2017-12-14 MED ORDER — METOPROLOL TARTRATE 100 MG PO TABS: 100.0000 mg | ORAL_TABLET | Freq: Two times a day (BID) | ORAL | 6 refills | Status: DC

## 2017-12-14 NOTE — Patient Instructions (Signed)
Medication Instructions:  INCREASE METOPROLOL 100MG  IN THE AM AND 50MG  IN THE PM  If you need a refill on your cardiac medications before your next appointment, please call your pharmacy.  Labwork: If you have labs (blood work) drawn today and your tests are completely normal, you will receive your results only by: Marland Kitchen MyChart Message (if you have MyChart) OR . A paper copy in the mail If you have any lab test that is abnormal or we need to change your treatment, we will call you to review the results.  Special Instructions: OK TO HOLD AMLODIPINE IF BP <120, PLEASE MAKE SURE TO CALL AND LET US(KATHERINE) KNOW   Follow-Up: You will need a follow up appointment in 1 MONTH.   You may see  DR Cecil Cranker, DNP, AACC or one of the following Advanced Practice Providers on your designated Care Team:  Jory Sims, DNP, [place], PA-C At [facility], you and your health needs are our priority.  As part of our continuing mission to provide you with exceptional heart care, we have created designated Provider Care Teams.  These Care Teams include your primary Cardiologist (physician) and Advanced Practice Providers (APPs -  Physician Assistants and Nurse Practitioners) who all work together to provide you with the care you need, when you need it.

## 2018-01-11 NOTE — Progress Notes (Signed)
Cardiology Office Note   Date:  01/15/2018   ID:  Mandy Perry, DOB Nov 02, 1940, MRN [id number]  PCP:  Gayland Curry, DO  Cardiologist: Dr. Ellyn Hack   Chief Complaint  Patient presents with  . Tachycardia    SVT     History of Present Illness: Mandy Perry is a 78 y.o. female who presents for ongoing assessment and management of SVT, she was last seen in the office on 12/14/2017 and reported incidences of SVT at least twice a day. She has given up all caffeine. Metoprolol was increased from 50 mg BID to 100 mg in the am and 50 mg in the pm. I recommended cardiac monitor but she refused this. Consideration for removal of amlodipine if BP becomes soft with symptoms with increased dose of metoprolol. She is here for follow up.   She's been evaluated in the past for chest discomfort is an echocardiogram done on the sclerotic aortic valve without stenosis.  She had treadmill stress test in November of 89 negative.  On heart catheterization 2005 that was essentially normal. Other history of DM. HL and HTN.   She is doing very well and states that the increase dose of metoprolol has helped her palpitations. She is a retired [employer] scientist and is medically complaint. She denies any symptoms.   Past Medical History:  Diagnosis Date  . Benign neoplasm of colon   . Diabetes mellitus without complication (HCC)    J1B currently a goal  . Essential hypertension, benign   . GERD (gastroesophageal reflux disease)   . Hyperlipidemia    Followed by PCP  . Hypertension   . Lumbago   . Migraine, unspecified, without mention of intractable migraine without mention of status migrainosus   . Osteoarthrosis, unspecified whether generalized or localized, unspecified site   . Palpitations    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  . Solitary pulmonary nodule 2012  . Urinary frequency     Past Surgical History:  Procedure Laterality Date  . ABDOMINAL HYSTERECTOMY    . CARDIAC CATHETERIZATION  11/15/1994   Patent  coronary arteries and normal LV function  . CARDIAC CATHETERIZATION  11/03/2003   Normal coronary arteries  . COLONOSCOPY  2010   Dr.Bucinni, Normal   . COLONOSCOPY  2004   Dr.Bucinni  . EXERCISE STRESS TEST  11/12/1987   Positive  . SHOULDER SURGERY Right   . TRANSTHORACIC ECHOCARDIOGRAM  05/19/2010   EF >55%, normal-mild     Current Outpatient Medications  Medication Sig Dispense Refill  . acetaminophen (TYLENOL) 500 MG tablet Take 500 mg by mouth as needed for pain.    Marland Kitchen amLODipine (NORVASC) 2.5 MG tablet Take 1 tablet (2.5 mg total) by mouth at bedtime. 90 tablet 3  . aspirin EC 81 MG tablet Take 81 mg by mouth daily.    . Cholecalciferol (VITAMIN D3) 2000 units CHEW Chew 1 capsule by mouth daily. 90 tablet 3  . conjugated estrogens (PREMARIN) vaginal cream Pea sized amt PV 2 times per week for atrophic vaginitis 42.5 g 3  . losartan (COZAAR) 50 MG tablet Take 1 tablet (50 mg total) by mouth daily. 90 tablet 1  . lovastatin (MEVACOR) 20 MG tablet TAKE ONE TABLET BY MOUTH AT BEDTIME 90 tablet 1  . metFORMIN (GLUCOPHAGE) 1000 MG tablet TAKE ONE TABLET BY MOUTH TWICE DAILY with meals 180 tablet 1  . metoprolol tartrate (LOPRESSOR) 100 MG tablet Take 1 tablet (100 mg total) by mouth 2 (two) times daily. 100mg  in the  am and 50mg  in the pm 60 tablet 6  . mirabegron ER (MYRBETRIQ) 25 MG TB24 tablet Take 1 tablet (25 mg total) by mouth daily. 30 tablet 3  . Multiple Vitamin (MULTIVITAMIN) tablet Take 1 tablet by mouth daily.     . Omega-3 1000 MG CAPS Take 1 g by mouth.    . vitamin B-12 (CYANOCOBALAMIN) 1000 MCG tablet Take 1,000 mcg by mouth daily.     No current facility-administered medications for this visit.     Allergies:   Azithromycin; Statins; Tramadol; and Gemfibrozil    Social History:  The patient  reports that she quit smoking about 41 years ago. Her smoking use included cigarettes. She has a 10.00 pack-year smoking history. She has never used smokeless tobacco. She  reports that she does not drink alcohol or use drugs.   Family History:  The patient's family history includes COPD in her brother; CVA in her mother; Cancer in her brother and father; Colon cancer in her sister; Diabetes in her sister and sister; Heart attack in her father.    ROS: All other systems are reviewed and negative. Unless otherwise mentioned in H&P    PHYSICAL EXAM: VS:  BP (!) 156/88 (BP Location: Left Arm)   Pulse 74   Wt 138 lb 9.6 oz (62.9 kg)   BMI 23.42 kg/m  , BMI Body mass index is 23.42 kg/m. GEN: Well nourished, well developed, in no acute distress HEENT: normal Neck: no JVD, carotid bruits, or masses Cardiac: RRR; no murmurs, rubs, or gallops,no edema  Respiratory:  Clear to auscultation bilaterally, normal work of breathing GI: soft, nontender, nondistended, + BS MS: no deformity or atrophy Skin: warm and dry, no rash Neuro:  Strength and sensation are intact Psych: euthymic mood, full affect   EKG:  Not completed this office visit.   Recent Labs: 01/19/2017: ALT 12 05/23/2017: TSH 2.18 10/31/2017: BUN 11; Creatinine, Ser 1.04; Hemoglobin 13.6; Platelets 335; Potassium 3.8; Sodium 138    Lipid Panel    Component Value Date/Time   CHOL 149 10/10/2017 1032   CHOL 139 11/17/2014 0944   TRIG 185 (H) 10/10/2017 1032   HDL 41 (L) 10/10/2017 1032   HDL 40 11/17/2014 0944   CHOLHDL 3.6 10/10/2017 1032   VLDL 29 05/04/2016 0818   LDLCALC 80 10/10/2017 1032      Wt Readings from Last 3 Encounters:  01/15/18 138 lb 9.6 oz (62.9 kg)  12/14/17 136 lb (61.7 kg)  11/17/17 136 lb (61.7 kg)      Other studies Reviewed: None   ASSESSMENT AND PLAN:  1.  PSVT:  She is doing well on metoprolol 100 mg in the am and 50 mg in th pm. No complaints of racing heart rate. Continue as directed.   2. Hypertension: BP is slightly elevated. She had not taken her BP medications yet today as she has not eaten. BP was rechecked and was essentially the same as when  she came in. She is advised to take her medications when she gets home.   3. Hyperlipidemia:  Continue Mevacor, follow up labs in 6 months unless seen by PCP for labs.    Current medicines are reviewed at length with the patient today.    Labs/ tests ordered today include: None   Phill Myron. West Pugh, ANP, AACC   01/15/2018 9:12 AM    [address] Office [phone] Fax [phone]

## 2018-01-15 ENCOUNTER — Encounter (INDEPENDENT_AMBULATORY_CARE_PROVIDER_SITE_OTHER): Payer: Self-pay

## 2018-01-15 ENCOUNTER — Encounter: Payer: Self-pay | Admitting: Adult Health

## 2018-01-15 ENCOUNTER — Ambulatory Visit: Payer: Medicare Other | Admitting: Adult Health

## 2018-01-15 VITALS — BP 156/88 | HR 74 | Wt 138.6 lb

## 2018-01-15 DIAGNOSIS — I471 Supraventricular tachycardia: Secondary | ICD-10-CM | POA: Diagnosis not present

## 2018-01-15 DIAGNOSIS — I10 Essential (primary) hypertension: Secondary | ICD-10-CM

## 2018-01-15 DIAGNOSIS — E7800 Pure hypercholesterolemia, unspecified: Secondary | ICD-10-CM | POA: Diagnosis not present

## 2018-01-15 NOTE — Patient Instructions (Addendum)
Follow-Up: You will need a follow up appointment in 6 months.  Please call our office 2 months in advance  (April 2020)  to schedule this appointment  (June 2020).  You may see Glenetta Hew, MD  Jory Sims, DNP, AACC or one of the following Advanced Practice Providers on your designated Care Team:   Jory Sims, DNP, AACC  Rosaria Ferries, PA-C      Medication Instructions:  NO CHANGES- Your physician recommends that you continue on your current medications as directed. Please refer to the Current Medication list given to you today. If you need a refill on your cardiac medications before your next appointment, please call your pharmacy.  Labwork: When you have labs (blood work) and your tests are completely normal, you will receive your results ONLY by [facility] (if you have MyChart) -OR- A paper copy in the mail.  At [facility], you and your health needs are our priority.  As part of our continuing mission to provide you with exceptional heart care, we have created designated Provider Care Teams.  These Care Teams include your primary Cardiologist (physician) and Advanced Practice Providers (APPs -  Physician Assistants and Nurse Practitioners) who all work together to provide you with the care you need, when you need it.  Thank you for choosing [facility] at [facility]!!

## 2018-01-18 ENCOUNTER — Encounter: Payer: Medicare Other | Admitting: Obstetrics & Gynecology

## 2018-02-08 LAB — HM DIABETES EYE EXAM

## 2018-02-13 ENCOUNTER — Other Ambulatory Visit: Payer: Medicare Other

## 2018-02-14 ENCOUNTER — Other Ambulatory Visit: Payer: Medicare Other

## 2018-02-15 ENCOUNTER — Ambulatory Visit: Payer: Medicare Other | Admitting: Internal Medicine

## 2018-02-15 ENCOUNTER — Encounter: Payer: Self-pay | Admitting: Family

## 2018-02-15 ENCOUNTER — Ambulatory Visit (INDEPENDENT_AMBULATORY_CARE_PROVIDER_SITE_OTHER): Payer: Medicare Other | Admitting: Family

## 2018-02-15 VITALS — BP 150/72 | HR 72 | Temp 98.2°F | Resp 18 | Ht 64.5 in | Wt 138.4 lb

## 2018-02-15 DIAGNOSIS — M1711 Unilateral primary osteoarthritis, right knee: Secondary | ICD-10-CM

## 2018-02-15 DIAGNOSIS — E1122 Type 2 diabetes mellitus with diabetic chronic kidney disease: Secondary | ICD-10-CM | POA: Diagnosis not present

## 2018-02-15 DIAGNOSIS — N183 Chronic kidney disease, stage 3 unspecified: Secondary | ICD-10-CM

## 2018-02-15 DIAGNOSIS — I129 Hypertensive chronic kidney disease with stage 1 through stage 4 chronic kidney disease, or unspecified chronic kidney disease: Secondary | ICD-10-CM

## 2018-02-15 DIAGNOSIS — E1169 Type 2 diabetes mellitus with other specified complication: Secondary | ICD-10-CM

## 2018-02-15 DIAGNOSIS — E782 Mixed hyperlipidemia: Secondary | ICD-10-CM

## 2018-02-15 DIAGNOSIS — K219 Gastro-esophageal reflux disease without esophagitis: Secondary | ICD-10-CM

## 2018-02-15 LAB — CBC WITH DIFFERENTIAL/PLATELET
Absolute Monocytes: 533 cells/uL (ref 200–950)
Basophils Absolute: 59 cells/uL (ref 0–200)
Basophils Relative: 0.9 %
Eosinophils Absolute: 111 cells/uL (ref 15–500)
Eosinophils Relative: 1.7 %
HCT: 40.6 % (ref 35.0–45.0)
Hemoglobin: 13.4 g/dL (ref 11.7–15.5)
Lymphs Abs: 1970 cells/uL (ref 850–3900)
MCH: 30.1 pg (ref 27.0–33.0)
MCHC: 33 g/dL (ref 32.0–36.0)
MCV: 91.2 fL (ref 80.0–100.0)
MPV: 10.1 fL (ref 7.5–12.5)
Monocytes Relative: 8.2 %
Neutro Abs: 3829 cells/uL (ref 1500–7800)
Neutrophils Relative %: 58.9 %
Platelets: 319 10*3/uL (ref 140–400)
RBC: 4.45 10*6/uL (ref 3.80–5.10)
RDW: 13.5 % (ref 11.0–15.0)
Total Lymphocyte: 30.3 %
WBC: 6.5 10*3/uL (ref 3.8–10.8)

## 2018-02-15 LAB — BASIC METABOLIC PANEL
BUN/Creatinine Ratio: 15 (calc) (ref 6–22)
BUN: 16 mg/dL (ref 7–25)
CO2: 25 mmol/L (ref 20–32)
Calcium: 9.6 mg/dL (ref 8.6–10.4)
Chloride: 106 mmol/L (ref 98–110)
Creat: 1.1 mg/dL — ABNORMAL HIGH (ref 0.60–0.93)
Glucose, Bld: 105 mg/dL — ABNORMAL HIGH (ref 65–99)
Potassium: 4.3 mmol/L (ref 3.5–5.3)
Sodium: 141 mmol/L (ref 135–146)

## 2018-02-15 LAB — HEMOGLOBIN A1C
Hgb A1c MFr Bld: 6.3 % of total Hgb — ABNORMAL HIGH (ref <5.7)
Mean Plasma Glucose: 134 (calc)
eAG (mmol/L): 7.4 (calc)

## 2018-02-15 MED ORDER — METOPROLOL TARTRATE 100 MG PO TABS: 100.0000 mg | ORAL_TABLET | Freq: Two times a day (BID) | ORAL | 6 refills | Status: DC

## 2018-02-15 NOTE — Patient Instructions (Signed)
1.Change Metoprolol 100 mg tablet to one by mouth twice daily 2. Check your blood pressure and record. Notify provider's office if Blood pressure running < 100/60 or Heart rate < 60 b/min.  3. Follow up in 2 weeks to check your blood pressure.   Hypertension Hypertension, commonly called high blood pressure, is when the force of blood pumping through the arteries is too strong. The arteries are the blood vessels that carry blood from the heart throughout the body. Hypertension forces the heart to work harder to pump blood and may cause arteries to become narrow or stiff. Having untreated or uncontrolled hypertension can cause heart attacks, strokes, kidney disease, and other problems. A blood pressure reading consists of a higher number over a lower number. Ideally, your blood pressure should be below 120/80. The first ("top") number is called the systolic pressure. It is a measure of the pressure in your arteries as your heart beats. The second ("bottom") number is called the diastolic pressure. It is a measure of the pressure in your arteries as the heart relaxes. What are the causes? The cause of this condition is not known. What increases the risk? Some risk factors for high blood pressure are under your control. Others are not. Factors you can change  Smoking.  Having type 2 diabetes mellitus, high cholesterol, or both.  Not getting enough exercise or physical activity.  Being overweight.  Having too much fat, sugar, calories, or salt (sodium) in your diet.  Drinking too much alcohol. Factors that are difficult or impossible to change  Having chronic kidney disease.  Having a family history of high blood pressure.  Age. Risk increases with age.  Race. You may be at higher risk if you are African-American.  Gender. Men are at higher risk than women before age 37. After age 72, women are at higher risk than men.  Having obstructive sleep apnea.  Stress. What are the signs or  symptoms? Extremely high blood pressure (hypertensive crisis) may cause:  Headache.  Anxiety.  Shortness of breath.  Nosebleed.  Nausea and vomiting.  Severe chest pain.  Jerky movements you cannot control (seizures). How is this diagnosed? This condition is diagnosed by measuring your blood pressure while you are seated, with your arm resting on a surface. The cuff of the blood pressure monitor will be placed directly against the skin of your upper arm at the level of your heart. It should be measured at least twice using the same arm. Certain conditions can cause a difference in blood pressure between your right and left arms. Certain factors can cause blood pressure readings to be lower or higher than normal (elevated) for a short period of time:  When your blood pressure is higher when you are in a health care provider's office than when you are at home, this is called white coat hypertension. Most people with this condition do not need medicines.  When your blood pressure is higher at home than when you are in a health care provider's office, this is called masked hypertension. Most people with this condition may need medicines to control blood pressure. If you have a high blood pressure reading during one visit or you have normal blood pressure with other risk factors:  You may be asked to return on a different day to have your blood pressure checked again.  You may be asked to monitor your blood pressure at home for 1 week or longer. If you are diagnosed with hypertension, you may have  other blood or imaging tests to help your health care provider understand your overall risk for other conditions. How is this treated? This condition is treated by making healthy lifestyle changes, such as eating healthy foods, exercising more, and reducing your alcohol intake. Your health care provider may prescribe medicine if lifestyle changes are not enough to get your blood pressure under  control, and if:  Your systolic blood pressure is above 130.  Your diastolic blood pressure is above 80. Your personal target blood pressure may vary depending on your medical conditions, your age, and other factors. Follow these instructions at home: Eating and drinking   Eat a diet that is high in fiber and potassium, and low in sodium, added sugar, and fat. An example eating plan is called the DASH (Dietary Approaches to Stop Hypertension) diet. To eat this way: ? Eat plenty of fresh fruits and vegetables. Try to fill half of your plate at each meal with fruits and vegetables. ? Eat whole grains, such as whole wheat pasta, brown rice, or whole grain bread. Fill about one quarter of your plate with whole grains. ? Eat or drink low-fat dairy products, such as skim milk or low-fat yogurt. ? Avoid fatty cuts of meat, processed or cured meats, and poultry with skin. Fill about one quarter of your plate with lean proteins, such as fish, chicken without skin, beans, eggs, and tofu. ? Avoid premade and processed foods. These tend to be higher in sodium, added sugar, and fat.  Reduce your daily sodium intake. Most people with hypertension should eat less than 1,500 mg of sodium a day.  Limit alcohol intake to no more than 1 drink a day for nonpregnant women and 2 drinks a day for men. One drink equals 12 oz of beer, 5 oz of wine, or 1 oz of hard liquor. Lifestyle   Work with your health care provider to maintain a healthy body weight or to lose weight. Ask what an ideal weight is for you.  Get at least 30 minutes of exercise that causes your heart to beat faster (aerobic exercise) most days of the week. Activities may include walking, swimming, or biking.  Include exercise to strengthen your muscles (resistance exercise), such as pilates or lifting weights, as part of your weekly exercise routine. Try to do these types of exercises for 30 minutes at least 3 days a week.  Do not use any  products that contain nicotine or tobacco, such as cigarettes and e-cigarettes. If you need help quitting, ask your health care provider.  Monitor your blood pressure at home as told by your health care provider.  Keep all follow-up visits as told by your health care provider. This is important. Medicines  Take over-the-counter and prescription medicines only as told by your health care provider. Follow directions carefully. Blood pressure medicines must be taken as prescribed.  Do not skip doses of blood pressure medicine. Doing this puts you at risk for problems and can make the medicine less effective.  Ask your health care provider about side effects or reactions to medicines that you should watch for. Contact a health care provider if:  You think you are having a reaction to a medicine you are taking.  You have headaches that keep coming back (recurring).  You feel dizzy.  You have swelling in your ankles.  You have trouble with your vision. Get help right away if:  You develop a severe headache or confusion.  You have unusual weakness or  numbness.  You feel faint.  You have severe pain in your chest or abdomen.  You vomit repeatedly.  You have trouble breathing. Summary  Hypertension is when the force of blood pumping through your arteries is too strong. If this condition is not controlled, it may put you at risk for serious complications.  Your personal target blood pressure may vary depending on your medical conditions, your age, and other factors. For most people, a normal blood pressure is less than 120/80.  Hypertension is treated with lifestyle changes, medicines, or a combination of both. Lifestyle changes include weight loss, eating a healthy, low-sodium diet, exercising more, and limiting alcohol. This information is not intended to replace advice given to you by your health care provider. Make sure you discuss any questions you have with your health care  provider. Document Released: 12/27/2004 Document Revised: 11/25/2015 Document Reviewed: 11/25/2015 Elsevier Interactive Patient Education  2019 [employer].

## 2018-02-15 NOTE — Progress Notes (Signed)
Provider: Luan Maberry FNP-C   Gayland Curry, DO  Patient Care Team: Gayland Curry, DO as PCP - General (Geriatric Medicine) Leonie Man, MD as PCP - Cardiology (Cardiology)  Extended Emergency Contact Information Primary Emergency Contact: Chriss Driver [place] of [place] Phone: [phone] Relation: Daughter   Goals of care: Advanced Directive information Advanced Directives 11/01/2017  Does Patient Have a Medical Advance Directive? Yes  Type of Paramedic of [place];Living will  Does patient want to make changes to medical advance directive? No - Patient declined  Copy of Enders in Chart? Yes  Would patient like information on creating a medical advance directive? -  Pre-existing out of facility DNR order (yellow form or pink MOST form) -    Chief Complaint  Patient presents with  . Medical Management of Chronic Issues    6 month follow up, patient c/o of not being able to remember things.   . Medication Management    Patient would like to discuss need for vitamin d and would like to see about detrol instead of mybetriq     HPI:  Pt is a 78 y.o. female seen today for medical management of chronic diseases.she states having issues remembering things.Has tried some over the counter supplement but did not work so she stopped. Also would like to restart detrol instead of mybetriq due to cost.Detrol D/ced by MD.she is also taking vit D 2000 units daily along with MVI. Hypertension - Blood pressure at home have been running high SBP > 150/90's.currnetly on metoprolol 100 mg in the morning and 50 mg tablet at night,Losartan 50 mg tablet daily and amlodipine 2.5 mg tablet daily.also on ASA EC 81 mg tablet and Lovastatin 20 mg tablet daily. She denies any headache,dizziness,chest pain or shortness of breath.She states has had palpitation 2-3 times sometimes.   Type 2 DM - currently on Metformin 1000 mg tablet twice  daily .On ASA and Statin as above.she checks her CBG at home readings ranging in the 100's -120's. She denies any signs of hypo/hyperglycemia.Labs reviewed Hgb A1C 6.3 ( 02/15/2018).   Chronic Kidney Disease - has hx with OAB but new issues. Recent CR 1.10 (02/15/2018). Previous 1.04 (10/31/2017).    Hyperlipidemia - On lovastatin 20 mg tablet daily.LDL 80 (10/10/2017). Does exercise several times per week at the [facility]    Osteoarthritis - ongoing pain on lower back,knees and thumbs symptoms have not worsen.Takes Tylenol with relief.   GERD-symptoms control on omeprazole 20 mg capsule daily.     Past Medical History:  Diagnosis Date  . Benign neoplasm of colon   . Diabetes mellitus without complication (HCC)    J6E currently a goal  . Essential hypertension, benign   . GERD (gastroesophageal reflux disease)   . Hyperlipidemia    Followed by PCP  . Hypertension   . Lumbago   . Migraine, unspecified, without mention of intractable migraine without mention of status migrainosus   . Osteoarthrosis, unspecified whether generalized or localized, unspecified site   . Palpitations    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  . Solitary pulmonary nodule 2012  . Urinary frequency    Past Surgical History:  Procedure Laterality Date  . ABDOMINAL HYSTERECTOMY    . CARDIAC CATHETERIZATION  11/15/1994   Patent coronary arteries and normal LV function  . CARDIAC CATHETERIZATION  11/03/2003   Normal coronary arteries  . COLONOSCOPY  2010   Dr.Bucinni, Normal   . COLONOSCOPY  2004  Dr.Bucinni  . EXERCISE STRESS TEST  11/12/1987   Positive  . SHOULDER SURGERY Right   . TRANSTHORACIC ECHOCARDIOGRAM  05/19/2010   EF >55%, normal-mild    Allergies  Allergen Reactions  . Azithromycin Hives  . Statins     Myalgias-high doses  . Tramadol Nausea Only  . Gemfibrozil Other (See Comments)    Leg cramps at night    Allergies as of 02/15/2018      Reactions   Azithromycin Hives   Statins    Myalgias-high  doses   Tramadol Nausea Only   Gemfibrozil Other (See Comments)   Leg cramps at night      Medication List       Accurate as of February 15, 2018  3:47 PM. Always use your most recent med list.        acetaminophen 500 MG tablet Commonly known as:  TYLENOL Take 500 mg by mouth as needed for pain.   amLODipine 2.5 MG tablet Commonly known as:  NORVASC Take 1 tablet (2.5 mg total) by mouth at bedtime.   aspirin EC 81 MG tablet Take 81 mg by mouth daily.   conjugated estrogens vaginal cream Commonly known as:  PREMARIN Pea sized amt PV 2 times per week for atrophic vaginitis   losartan 50 MG tablet Commonly known as:  COZAAR Take 1 tablet (50 mg total) by mouth daily.   lovastatin 20 MG tablet Commonly known as:  MEVACOR TAKE ONE TABLET BY MOUTH AT BEDTIME   metFORMIN 1000 MG tablet Commonly known as:  GLUCOPHAGE TAKE ONE TABLET BY MOUTH TWICE DAILY with meals   metoprolol tartrate 100 MG tablet Commonly known as:  LOPRESSOR Take 1 tablet (100 mg total) by mouth 2 (two) times daily. Take one by mouth twice daily.   mirabegron ER 25 MG Tb24 tablet Commonly known as:  MYRBETRIQ Take 1 tablet (25 mg total) by mouth daily.   multivitamin tablet Take 1 tablet by mouth daily.   Omega-3 1000 MG Caps Take 1 g by mouth.   omeprazole 20 MG capsule Commonly known as:  PRILOSEC Take 20 mg by mouth daily.   vitamin B-12 1000 MCG tablet Commonly known as:  CYANOCOBALAMIN Take 1,000 mcg by mouth daily.   Vitamin D3 50 MCG (2000 UT) Chew Chew 1 capsule by mouth daily.       Review of Systems  Constitutional: Negative for chills, fatigue, fever and unexpected weight change.  HENT: Negative for congestion, rhinorrhea, sinus pressure, sinus pain, sneezing and sore throat.   Eyes: Positive for visual disturbance. Negative for discharge, redness and itching.       Wears eye glasses recently seen by Ophthalmology no changes in vision.  Respiratory: Negative for cough,  chest tightness, shortness of breath and wheezing.   Cardiovascular: Negative for chest pain and leg swelling.       Occasional palpitation 2-3 times per day at times   Gastrointestinal: Negative for abdominal distention, abdominal pain, constipation, nausea and vomiting.       Occasional loose stool   Endocrine: Negative for cold intolerance, heat intolerance, polydipsia, polyphagia and polyuria.  Genitourinary: Negative for dysuria, flank pain and hematuria.       Hx OAB   Musculoskeletal: Positive for arthralgias and back pain. Negative for gait problem.  Skin: Negative for color change, pallor and rash.  Neurological: Negative for dizziness, light-headedness, numbness and headaches.  Hematological: Does not bruise/bleed easily.  Psychiatric/Behavioral: Negative for agitation, confusion and sleep disturbance. The  patient is not nervous/anxious.        Reports having issues with her memory     Immunization History  Administered Date(s) Administered  . Influenza, High Dose Seasonal PF 09/22/2016, 10/16/2017  . Influenza,inj,Quad PF,6+ Mos 11/20/2012, 10/03/2013, 11/20/2014  . Influenza-Unspecified 10/07/2010, 11/03/2011, 11/21/2015  . Pneumococcal Conjugate-13 12/31/2012  . Pneumococcal Polysaccharide-23 11/20/2014  . Tdap 09/03/2014   Pertinent  Health Maintenance Due  Topic Date Due  . OPHTHALMOLOGY EXAM  06/28/2016  . FOOT EXAM  01/23/2018  . HEMOGLOBIN A1C  04/11/2018  . INFLUENZA VACCINE  Completed  . DEXA SCAN  Completed  . PNA vac Low Risk Adult  Completed   Fall Risk  02/15/2018 11/01/2017 11/01/2017 10/16/2017 05/25/2017  Falls in the past year? 0 No No No No  Comment - - - - -  Number falls in past yr: 0 - - - -  Injury with Fall? 0 - - - -    Vitals:   02/15/18 1444  BP: (!) 150/72  Pulse: 72  Resp: 18  Temp: 98.2 F (36.8 C)  TempSrc: Oral  SpO2: 97%  Weight: 138 lb 6.4 oz (62.8 kg)  Height: 5' 4.5" (1.638 m)   Body mass index is 23.39 kg/m. Physical  Exam Vitals signs reviewed.  Constitutional:      General: She is not in acute distress.    Appearance: She is normal weight.  HENT:     Head: Normocephalic.     Right Ear: Tympanic membrane, ear canal and external ear normal. There is no impacted cerumen.     Left Ear: Tympanic membrane, ear canal and external ear normal. There is no impacted cerumen.     Nose: Nose normal. No congestion or rhinorrhea.     Mouth/Throat:     Mouth: Mucous membranes are moist.     Pharynx: Oropharynx is clear. No oropharyngeal exudate or posterior oropharyngeal erythema.  Eyes:     General: No scleral icterus.       Right eye: No discharge.        Left eye: No discharge.     Extraocular Movements: Extraocular movements intact.     Conjunctiva/sclera: Conjunctivae normal.     Pupils: Pupils are equal, round, and reactive to light.     Comments: Corrective lens in place   Cardiovascular:     Rate and Rhythm: Normal rate and regular rhythm.     Pulses: Normal pulses.     Heart sounds: No murmur. No friction rub. No gallop.   Pulmonary:     Effort: Pulmonary effort is normal. No respiratory distress.     Breath sounds: Normal breath sounds. No wheezing, rhonchi or rales.  Chest:     Chest wall: No tenderness.  Abdominal:     General: Bowel sounds are normal. There is no distension.     Palpations: Abdomen is soft. There is no mass.     Tenderness: There is no abdominal tenderness. There is no right CVA tenderness, left CVA tenderness, guarding or rebound.  Musculoskeletal: Normal range of motion.        General: No swelling or tenderness.     Right lower leg: No edema.     Left lower leg: No edema.  Skin:    General: Skin is warm and dry.     Findings: No erythema, lesion or rash.  Neurological:     Mental Status: She is alert and oriented to person, place, and time.     Cranial  Nerves: No cranial nerve deficit.     Sensory: No sensory deficit.     Motor: No weakness.     Coordination:  Coordination normal.     Gait: Gait normal.     Comments: Latest MMSE scored 30/30   Psychiatric:        Mood and Affect: Mood normal.        Behavior: Behavior normal.        Thought Content: Thought content normal.        Judgment: Judgment normal.    Labs reviewed: Recent Labs    10/10/17 1032 10/31/17 1517 02/14/18 0832  NA 139 138 141  K 4.2 3.8 4.3  CL 104 106 106  CO2 25 22 25   GLUCOSE 105* 97 105*  BUN 16 11 16   CREATININE 1.08* 1.04* 1.10*  CALCIUM 9.4 9.4 9.6   No results for input(s): AST, ALT, ALKPHOS, BILITOT, PROT, ALBUMIN in the last 8760 hours. Recent Labs    10/31/17 1517 02/14/18 0832  WBC 6.8 6.5  NEUTROABS  --  3,829  HGB 13.6 13.4  HCT 41.4 40.6  MCV 93.0 91.2  PLT 335 319   Lab Results  Component Value Date   TSH 2.18 05/23/2017   Lab Results  Component Value Date   HGBA1C 6.3 (H) 02/14/2018   Lab Results  Component Value Date   CHOL 149 10/10/2017   HDL 41 (L) 10/10/2017   LDLCALC 80 10/10/2017   TRIG 185 (H) 10/10/2017   CHOLHDL 3.6 10/10/2017    Significant Diagnostic Results in last 30 days:  No results found.  Assessment/Plan 1. Benign hypertension with CKD (chronic kidney disease) stage III (HCC) B/p not at goal.recommeded increasing Amlodipine to 5 mg tablet daily but requested metoprolol to 100 mg tablet twice daily from 100 mg in the morning and 50 mg tablet at night due to palpitation.Continue on Losartan 50 mg tablet daily and amlodipine 2.5 mg tablet daily.continue on ASA EC 81 mg tablet and Lovastatin 20 mg tablet daily.  - Check your blood pressure and record. Notify provider's office if Blood pressure running < 100/60 or Heart rate < 60 b/min.  - CBC with Differential/Platelet; Future - CMP with eGFR([facility]); Future - TSH; Future  2. Controlled type 2 diabetes mellitus with stage 3 chronic kidney disease, without long-term current use of insulin (HCC) Lab Results  Component Value Date   HGBA1C 6.3 (H) 02/14/2018     CBG under control.continue on Metformin 1000 mg tablet twice daily .On ASA and Statin for cardiovascular event prophylaxis. Up to date with annual  Eye exam seen recently by Ophthalmology no records. - Hgb A1C, future   3. CKD (chronic kidney disease) stage 3, GFR 30-59 ml/min (HCC) CR at baseline. Continue to avoid nephrotoxins and dose all other medications for renal clearance.   4. Mixed hyperlipidemia due to type 2 diabetes mellitus ([place]) LDL 80 not at goal for Type 2 DM.continue on lovastatin 20 mg tablet daily.low carbohydrates,low saturated fats diet and high vegetable diet and exercise  Recommended.  - Lipid panel; Future  5. Primary osteoarthritis of right knee Continue on Tylenol as needed for pain.continue to exercise.   6. Gastroesophageal reflux disease without esophagitis Symptoms controlled on omeprazole.continue to avoid irritant foods.   Family/ staff Communication: Reviewed plan of care with patient  Labs/tests ordered: CBC/diff,CMP,TSH, Hgb A1C and Lipid panel  Prior to next appointment in 6 months   - Follow up in 2 weeks to check your blood  pressure.   Sandrea Hughs, NP

## 2018-02-20 ENCOUNTER — Ambulatory Visit
Admission: EM | Admit: 2018-02-20 | Discharge: 2018-02-20 | Disposition: A | Discharge status: Home / Self Care | Payer: Medicare Other | Attending: Nurse Practitioner | Admitting: Nurse Practitioner

## 2018-02-20 DIAGNOSIS — N3001 Acute cystitis with hematuria: Secondary | ICD-10-CM | POA: Insufficient documentation

## 2018-02-20 DIAGNOSIS — Z87891 Personal history of nicotine dependence: Secondary | ICD-10-CM

## 2018-02-20 DIAGNOSIS — I10 Essential (primary) hypertension: Secondary | ICD-10-CM

## 2018-02-20 LAB — POCT URINALYSIS DIP (MANUAL ENTRY)
Bilirubin, UA: NEGATIVE
GLUCOSE UA: NEGATIVE mg/dL
Ketones, POC UA: NEGATIVE mg/dL
Nitrite, UA: NEGATIVE
Protein Ur, POC: 100 mg/dL — AB
Spec Grav, UA: 1.01 (ref 1.010–1.025)
Urobilinogen, UA: 0.2 E.U./dL
pH, UA: 5.5 (ref 5.0–8.0)

## 2018-02-20 MED ORDER — NITROFURANTOIN MONOHYD MACRO 100 MG PO CAPS: 100.0000 mg | ORAL_CAPSULE | Freq: Two times a day (BID) | ORAL | 0 refills | Status: DC

## 2018-02-20 NOTE — ED Provider Notes (Signed)
[facility]    CSN: [id number] Arrival date & time: 02/20/18  1359     History   Chief Complaint Chief Complaint  Patient presents with  . Urinary Tract Infection    HPI Mandy Perry is a 78 y.o. female.   Subjective:  Mandy Perry is a 78 y.o. female who complains of dysuria and frequency for 3 days. Patient denies fever, vaginal discharge or flank pain.  Patient does not have a history of recurrent UTI.  Patient does not have a history of pyelonephritis.  The following portions of the patient's history were reviewed and updated as appropriate: allergies, current medications, past family history, past medical history, past social history, past surgical history and problem list.       Past Medical History:  Diagnosis Date  . Benign neoplasm of colon   . Diabetes mellitus without complication (HCC)    T0G currently a goal  . Essential hypertension, benign   . GERD (gastroesophageal reflux disease)   . Hyperlipidemia    Followed by PCP  . Hypertension   . Lumbago   . Migraine, unspecified, without mention of intractable migraine without mention of status migrainosus   . Osteoarthrosis, unspecified whether generalized or localized, unspecified site   . Palpitations    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  . Solitary pulmonary nodule 2012  . Urinary frequency     Patient Active Problem List   Diagnosis Date Noted  . DDD (degenerative disc disease), lumbar 10/16/2017  . Change in bowel habits 10/16/2017  . B12 deficiency 06/08/2017  . Primary osteoarthritis of right knee 02/05/2017  . Overactive bladder 05/16/2016  . Senile osteopenia 05/16/2016  . Onychomycosis 05/16/2016  . 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 Heart palpitations 10/16/2013  . Gastroesophageal reflux disease with esophagitis 07/19/2013  . Controlled type 2 diabetes mellitus with stage 3 chronic kidney disease, without long-term current use of insulin ([place]) 07/19/2013  . CKD (chronic  kidney disease) stage 3, GFR 30-59 ml/min (HCC) 07/19/2013  . Diabetic autonomic neuropathy associated with type 2 diabetes mellitus ([place]) 07/19/2013  . Mixed hyperlipidemia due to type 2 diabetes mellitus ([place]) 07/19/2013  . History of PSVT (paroxysmal supraventricular tachycardia) 10/11/2012  . Essential hypertension   . Diabetes mellitus without complication ([place])   . Hypercholesterolemia with hypertriglyceridemia     Past Surgical History:  Procedure Laterality Date  . ABDOMINAL HYSTERECTOMY    . CARDIAC CATHETERIZATION  11/15/1994   Patent coronary arteries and normal LV function  . CARDIAC CATHETERIZATION  11/03/2003   Normal coronary arteries  . COLONOSCOPY  2010   Dr.Bucinni, Normal   . COLONOSCOPY  2004   Dr.Bucinni  . EXERCISE STRESS TEST  11/12/1987   Positive  . SHOULDER SURGERY Right   . TRANSTHORACIC ECHOCARDIOGRAM  05/19/2010   EF >55%, normal-mild    OB History    Gravida  3   Para  3   Term      Preterm      AB      Living  3     SAB      TAB      Ectopic      Multiple      Live Births               Home Medications    Prior to Admission medications   Medication Sig Start Date End Date Taking? Authorizing Provider  acetaminophen (TYLENOL) 500 MG tablet Take 500 mg by mouth as needed  for pain.    [provider]  amLODipine (NORVASC) 2.5 MG tablet Take 1 tablet (2.5 mg total) by mouth at bedtime. 09/06/17   Reed, Tiffany L, DO  aspirin EC 81 MG tablet Take 81 mg by mouth daily.    [provider]  Cholecalciferol (VITAMIN D3) 2000 units CHEW Chew 1 capsule by mouth daily. 08/13/15   Reed, Tiffany L, DO  conjugated estrogens (PREMARIN) vaginal cream Pea sized amt PV 2 times per week for atrophic vaginitis 09/26/17   Gildardo Cranker, DO  losartan (COZAAR) 50 MG tablet Take 1 tablet (50 mg total) by mouth daily. 12/12/17   Reed, Tiffany L, DO  lovastatin (MEVACOR) 20 MG tablet TAKE ONE TABLET BY MOUTH AT BEDTIME 07/11/17   Reed,  Tiffany L, DO  metFORMIN (GLUCOPHAGE) 1000 MG tablet TAKE ONE TABLET BY MOUTH TWICE DAILY with meals 05/19/17   Reed, Tiffany L, DO  metoprolol tartrate (LOPRESSOR) 100 MG tablet Take 1 tablet (100 mg total) by mouth 2 (two) times daily. Take one by mouth twice daily. 02/15/18   Ngetich, Dinah C, NP  mirabegron ER (MYRBETRIQ) 25 MG TB24 tablet Take 1 tablet (25 mg total) by mouth daily. 01/23/17   Reed, Tiffany L, DO  Multiple Vitamin (MULTIVITAMIN) tablet Take 1 tablet by mouth daily.     [provider]  nitrofurantoin, macrocrystal-monohydrate, (MACROBID) 100 MG capsule Take 1 capsule (100 mg total) by mouth 2 (two) times daily. 02/20/18   Enrique Sack, FNP  Omega-3 1000 MG CAPS Take 1 g by mouth.    [provider]  omeprazole (PRILOSEC) 20 MG capsule Take 20 mg by mouth daily.    [provider]  vitamin B-12 (CYANOCOBALAMIN) 1000 MCG tablet Take 1,000 mcg by mouth daily.    [provider]    Family History Family History  Problem Relation Age of Onset  . CVA Mother   . Heart attack Father   . Cancer Father        Prostate cancer  . COPD Brother   . Diabetes Sister   . Colon cancer Sister   . Diabetes Sister   . Cancer Brother     Social History Social History   Tobacco Use  . Smoking status: Former Smoker    Packs/day: 0.50    Years: 20.00    Pack years: 10.00    Types: Cigarettes    Last attempt to quit: 01/10/1977    Years since quitting: 41.1  . Smokeless tobacco: Never Used  . Tobacco comment: Quit at age 88  Substance Use Topics  . Alcohol use: No  . Drug use: No     Allergies   Azithromycin; Statins; Tramadol; and Gemfibrozil   Review of Systems Review of Systems  Constitutional: Negative.   Respiratory: Negative.   Cardiovascular: Negative.   Genitourinary: Positive for dysuria and frequency.  Neurological: Negative.   All other systems reviewed and are negative.    Physical Exam Triage Vital Signs ED Triage  Vitals  Enc Vitals Group     BP 02/20/18 1458 (!) 194/71     Pulse Rate 02/20/18 1458 63     Resp 02/20/18 1458 18     Temp 02/20/18 1458 97.7 F (36.5 C)     Temp Source 02/20/18 1458 Oral     SpO2 02/20/18 1458 98 %     Weight --      Height --      Head Circumference --  Peak Flow --      Pain Score 02/20/18 1459 0     Pain Loc --      Pain Edu? --      Excl. in [place]? --    No data found.  Updated Vital Signs BP (!) 183/76 (BP Location: Right Arm)   Pulse 63   Temp 97.7 F (36.5 C) (Oral)   Resp 18   SpO2 98%   Visual Acuity Right Eye Distance:   Left Eye Distance:   Bilateral Distance:    Right Eye Near:   Left Eye Near:    Bilateral Near:     Physical Exam Vitals signs reviewed.  Constitutional:      Appearance: Normal appearance.  HENT:     Head: Normocephalic.  Neck:     Musculoskeletal: Normal range of motion.  Cardiovascular:     Rate and Rhythm: Normal rate and regular rhythm.  Pulmonary:     Effort: Pulmonary effort is normal.     Breath sounds: Normal breath sounds.  Musculoskeletal: Normal range of motion.  Skin:    General: Skin is warm and dry.  Neurological:     General: No focal deficit present.     Mental Status: She is alert and oriented to person, place, and time.  Psychiatric:        Mood and Affect: Mood normal.      UC Treatments / Results  Labs (all labs ordered are listed, but only abnormal results are displayed) Labs Reviewed  POCT URINALYSIS DIP (MANUAL ENTRY) - Abnormal; Notable for the following components:      Result Value   Clarity, UA cloudy (*)    Blood, UA large (*)    Protein Ur, POC =100 (*)    Leukocytes, UA Large (3+) (*)    All other components within normal limits    EKG None  Radiology No results found.  Procedures Procedures (including critical care time)  Medications Ordered in UC Medications - No data to display  Initial Impression / Assessment and Plan / UC Course  I have reviewed  the triage vital signs and the nursing notes.  Pertinent labs & imaging results that were available during my care of the patient were reviewed by me and considered in my medical decision making (see chart for details).     78 year old female presenting with a 3-day history of UTI type symptoms.  Urine dipstick shows large for hemoglobin, 3+ for leukocyte esterase and positive for protein.  Patient was also noticed to have an elevated blood pressure while in the office.  Patient states that she took her medications around 1:00 this afternoon.  She denies any headache, blurred vision, dizziness, chest pain or palpitations.    Plan -  1. Medications: nitrofurantoin 2. Maintain adequate hydration 3. Follow up if symptoms not improving, and prn.  Today's evaluation has revealed no signs of a dangerous process. Discussed diagnosis with patient. Patient aware of their diagnosis, possible red flag symptoms to watch out for and need for close follow up. Patient understands verbal and written discharge instructions. Patient comfortable with plan and disposition.  Patient has a clear mental status at this time, good insight into illness (after discussion and teaching) and has clear judgment to make decisions regarding their care.  Documentation was completed with the aid of voice recognition software. Transcription may contain typographical errors. Final Clinical Impressions(s) / UC Diagnoses   Final diagnoses:  Acute cystitis with hematuria  Elevated blood pressure  reading in office with diagnosis of hypertension   Discharge Instructions   None    ED Prescriptions    Medication Sig Dispense Auth. Provider   nitrofurantoin, macrocrystal-monohydrate, (MACROBID) 100 MG capsule Take 1 capsule (100 mg total) by mouth 2 (two) times daily. 10 capsule Enrique Sack, FNP     Controlled Substance Prescriptions [place] Controlled Substance Registry consulted? Not Applicable   Enrique Sack,  [place] 02/20/18 1546

## 2018-02-20 NOTE — ED Triage Notes (Signed)
Pt c/o burning on urination since sunday

## 2018-02-22 LAB — URINE CULTURE: Culture: <10000 — AB

## 2018-02-23 ENCOUNTER — Ambulatory Visit (INDEPENDENT_AMBULATORY_CARE_PROVIDER_SITE_OTHER): Payer: Medicare Other | Admitting: Nurse Practitioner

## 2018-02-23 ENCOUNTER — Encounter: Payer: Self-pay | Admitting: Nurse Practitioner

## 2018-02-23 ENCOUNTER — Ambulatory Visit: Payer: Medicare Other | Admitting: Nurse Practitioner

## 2018-02-23 VITALS — BP 180/82 | HR 66 | Temp 98.4°F | Ht 64.5 in | Wt 136.0 lb

## 2018-02-23 DIAGNOSIS — I10 Essential (primary) hypertension: Secondary | ICD-10-CM

## 2018-02-23 DIAGNOSIS — I129 Hypertensive chronic kidney disease with stage 1 through stage 4 chronic kidney disease, or unspecified chronic kidney disease: Secondary | ICD-10-CM

## 2018-02-23 DIAGNOSIS — N183 Chronic kidney disease, stage 3 (moderate): Secondary | ICD-10-CM | POA: Diagnosis not present

## 2018-02-23 MED ORDER — AMLODIPINE BESYLATE 5 MG PO TABS: 2.5000 mg | ORAL_TABLET | Freq: Every day | ORAL | 0 refills | Status: DC

## 2018-02-23 MED ORDER — CLONIDINE HCL 0.1 MG PO TABS
0.1000 mg | ORAL_TABLET | Freq: Once | ORAL | Status: AC
  Administered 2018-02-23: 0.1 mg via ORAL

## 2018-02-23 NOTE — Progress Notes (Signed)
Careteam: Patient Care Team: Gayland Curry, DO as PCP - General (Geriatric Medicine) Leonie Man, MD as PCP - Cardiology (Cardiology)  Advanced Directive information    Allergies  Allergen Reactions  . Azithromycin Hives  . Statins     Myalgias-high doses  . Tramadol Nausea Only  . Gemfibrozil Other (See Comments)    Leg cramps at night    Chief Complaint  Patient presents with  . Acute Visit    increased blood pressure, right leg pain     HPI: Patient is a 78 y.o. female seen in the office today for elevated blood pressure.  Work up this morning and her leg was feeling odd so she decided to take her blood pressure and blood sugar.  Blood pressure was elevated 190-200/90s based on her automatic machine. Thought this could be wrong.  Blood sugar was 95.  Leg is normal at this time. Felt like it was a cramp. Took tylenol and it resolved.  Pt reports headache at this time, this has been ongoing No chest pains, no shortness of breath. Blood pressure has been high in the past but normally improves.  She saw Dinah, NP last week and she increased metoprolol from 100 in am and 50 in pm to 100 mg BID. Blood pressure have NOT improved.  She takes metoprolol, losartan and amlodipine for blood pressure.  Had fried fish yesterday for lunch and dinner. She adds salt to her food routinely.  Currently with UTI, taking microbid Reviewing home blood pressures they have ranged 160-170s/80-90 since November and she reports they have been running high. Review of Systems:  Review of Systems  Constitutional: Negative for chills, fever, malaise/fatigue and weight loss.  HENT: Negative for congestion and hearing loss.   Eyes: Negative for blurred vision.  Respiratory: Negative for cough and shortness of breath.   Cardiovascular: Negative for chest pain, palpitations, orthopnea, leg swelling and PND.  Musculoskeletal: Negative for back pain, falls and joint pain.  Neurological:  Positive for headaches. Negative for dizziness, sensory change, speech change, focal weakness and loss of consciousness.  Psychiatric/Behavioral: Positive for memory loss. Negative for depression. The patient is not nervous/anxious and does not have insomnia.     Past Medical History:  Diagnosis Date  . Benign neoplasm of colon   . Diabetes mellitus without complication (HCC)    M0N currently a goal  . Essential hypertension, benign   . GERD (gastroesophageal reflux disease)   . Hyperlipidemia    Followed by PCP  . Hypertension   . Lumbago   . Migraine, unspecified, without mention of intractable migraine without mention of status migrainosus   . Osteoarthrosis, unspecified whether generalized or localized, unspecified site   . Palpitations    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  . Solitary pulmonary nodule 2012  . Urinary frequency    Past Surgical History:  Procedure Laterality Date  . ABDOMINAL HYSTERECTOMY    . CARDIAC CATHETERIZATION  11/15/1994   Patent coronary arteries and normal LV function  . CARDIAC CATHETERIZATION  11/03/2003   Normal coronary arteries  . COLONOSCOPY  2010   Dr.Bucinni, Normal   . COLONOSCOPY  2004   Dr.Bucinni  . EXERCISE STRESS TEST  11/12/1987   Positive  . SHOULDER SURGERY Right   . TRANSTHORACIC ECHOCARDIOGRAM  05/19/2010   EF >55%, normal-mild   Social History:   reports that she quit smoking about 41 years ago. Her smoking use included cigarettes. She has a 10.00 pack-year smoking history.  She has never used smokeless tobacco. She reports that she does not drink alcohol or use drugs.  Family History  Problem Relation Age of Onset  . CVA Mother   . Heart attack Father   . Cancer Father        Prostate cancer  . COPD Brother   . Diabetes Sister   . Colon cancer Sister   . Diabetes Sister   . Cancer Brother     Medications: Patient's Medications  New Prescriptions   No medications on file  Previous Medications   ACETAMINOPHEN  (TYLENOL) 500 MG TABLET    Take 500 mg by mouth as needed for pain.   AMLODIPINE (NORVASC) 2.5 MG TABLET    Take 1 tablet (2.5 mg total) by mouth at bedtime.   ASPIRIN EC 81 MG TABLET    Take 81 mg by mouth daily.   CHOLECALCIFEROL (VITAMIN D3) 2000 UNITS CHEW    Chew 1 capsule by mouth daily.   CONJUGATED ESTROGENS (PREMARIN) VAGINAL CREAM    Pea sized amt PV 2 times per week for atrophic vaginitis   LOSARTAN (COZAAR) 50 MG TABLET    Take 1 tablet (50 mg total) by mouth daily.   LOVASTATIN (MEVACOR) 20 MG TABLET    TAKE ONE TABLET BY MOUTH AT BEDTIME   METFORMIN (GLUCOPHAGE) 1000 MG TABLET    TAKE ONE TABLET BY MOUTH TWICE DAILY with meals   METOPROLOL TARTRATE (LOPRESSOR) 100 MG TABLET    Take 1 tablet (100 mg total) by mouth 2 (two) times daily. Take one by mouth twice daily.   MIRABEGRON ER (MYRBETRIQ) 25 MG TB24 TABLET    Take 1 tablet (25 mg total) by mouth daily.   MULTIPLE VITAMIN (MULTIVITAMIN) TABLET    Take 1 tablet by mouth daily.    NITROFURANTOIN, MACROCRYSTAL-MONOHYDRATE, (MACROBID) 100 MG CAPSULE    Take 1 capsule (100 mg total) by mouth 2 (two) times daily.   OMEGA-3 1000 MG CAPS    Take 1 g by mouth.   OMEPRAZOLE (PRILOSEC) 20 MG CAPSULE    Take 20 mg by mouth daily.   VITAMIN B-12 (CYANOCOBALAMIN) 1000 MCG TABLET    Take 1,000 mcg by mouth daily.  Modified Medications   No medications on file  Discontinued Medications   No medications on file     Physical Exam:  Vitals:   02/23/18 1129 02/23/18 1149  BP: (!) 170/80 (!) 180/82  Pulse: 66   Temp: 98.4 F (36.9 C)   TempSrc: Oral   SpO2: 95%   Weight: 136 lb (61.7 kg)   Height: 5' 4.5" (1.638 m)    Body mass index is 22.98 kg/m.  Physical Exam Constitutional:      General: She is not in acute distress.    Appearance: She is well-developed. She is not diaphoretic.  HENT:     Head: Normocephalic and atraumatic.     Mouth/Throat:     Pharynx: No oropharyngeal exudate.  Eyes:     Conjunctiva/sclera:  Conjunctivae normal.     Pupils: Pupils are equal, round, and reactive to light.  Neck:     Musculoskeletal: Normal range of motion and neck supple.  Cardiovascular:     Rate and Rhythm: Normal rate and regular rhythm.     Heart sounds: Normal heart sounds.  Pulmonary:     Effort: Pulmonary effort is normal.     Breath sounds: Normal breath sounds.  Abdominal:     General: Bowel sounds are normal.  Palpations: Abdomen is soft.  Musculoskeletal:        General: No swelling or tenderness.     Right lower leg: No edema.     Left lower leg: No edema.  Skin:    General: Skin is warm and dry.  Neurological:     Mental Status: She is alert and oriented to person, place, and time.     Labs reviewed: Basic Metabolic Panel: Recent Labs    05/23/17 0807 10/10/17 1032 10/31/17 1517 02/14/18 0832  NA 140 139 138 141  K 4.1 4.2 3.8 4.3  CL 104 104 106 106  CO2 28 25 22 25   GLUCOSE 104* 105* 97 105*  BUN 18 16 11 16   CREATININE 1.02* 1.08* 1.04* 1.10*  CALCIUM 9.7 9.4 9.4 9.6  TSH 2.18  --   --   --    Liver Function Tests: No results for input(s): AST, ALT, ALKPHOS, BILITOT, PROT, ALBUMIN in the last 8760 hours. No results for input(s): LIPASE, AMYLASE in the last 8760 hours. No results for input(s): AMMONIA in the last 8760 hours. CBC: Recent Labs    10/31/17 1517 02/14/18 0832  WBC 6.8 6.5  NEUTROABS  --  3,829  HGB 13.6 13.4  HCT 41.4 40.6  MCV 93.0 91.2  PLT 335 319   Lipid Panel: Recent Labs    05/23/17 0807 10/10/17 1032  CHOL 136 149  HDL 43* 41*  LDLCALC 67 80  TRIG 180* 185*  CHOLHDL 3.2 3.6   TSH: Recent Labs    05/23/17 0807  TSH 2.18   A1C: Lab Results  Component Value Date   HGBA1C 6.3 (H) 02/14/2018     Assessment/Plan 1. Benign hypertension with CKD (chronic kidney disease) stage III (HCC) -review blood pressure log and she has had elevated blood pressure for several months. Reading in office consistent with several home BP since  November 2019 - cloNIDine (CATAPRES) tablet 0.1 mg given in office today -will increase norvasc to 5 mg daily  -to continue cozaar 50 mg daily (may also need to increase this) and metoprolol 100 mg BID  - amLODipine (NORVASC) 5 MG tablet; Take 0.5 tablets (5 mg total) by mouth at bedtime.  Dispense: 30 tablet; Refill: 0 -return precautions given and to see emergency care if needed  Next appt: 1 week for blood pressure as scheduled.  Carlos American. Alexander, [facility] [phone]

## 2018-02-23 NOTE — Patient Instructions (Signed)
Increase amlodipine 5 mg by mouth daily.  To work on diet modifications. Follow up in 1 week on blood pressure, may need to further adjust medication.  Continue to check blood pressure (after medication and make sure you have been sitting for at least 5 mins)  DASH Eating Plan DASH stands for "Dietary Approaches to Stop Hypertension." The DASH eating plan is a healthy eating plan that has been shown to reduce high blood pressure (hypertension). It may also reduce your risk for type 2 diabetes, heart disease, and stroke. The DASH eating plan may also help with weight loss. What are tips for following this plan?  General guidelines  Avoid eating more than 2,300 mg (milligrams) of salt (sodium) a day. If you have hypertension, you may need to reduce your sodium intake to 1,500 mg a day.  Limit alcohol intake to no more than 1 drink a day for nonpregnant women and 2 drinks a day for men. One drink equals 12 oz of beer, 5 oz of wine, or 1 oz of hard liquor.  Work with your health care provider to maintain a healthy body weight or to lose weight. Ask what an ideal weight is for you.  Get at least 30 minutes of exercise that causes your heart to beat faster (aerobic exercise) most days of the week. Activities may include walking, swimming, or biking.  Work with your health care provider or diet and nutrition specialist (dietitian) to adjust your eating plan to your individual calorie needs. Reading food labels   Check food labels for the amount of sodium per serving. Choose foods with less than 5 percent of the Daily Value of sodium. Generally, foods with less than 300 mg of sodium per serving fit into this eating plan.  To find whole grains, look for the word "whole" as the first word in the ingredient list. Shopping  Buy products labeled as "low-sodium" or "no salt added."  Buy fresh foods. Avoid canned foods and premade or frozen meals. Cooking  Avoid adding salt when cooking. Use  salt-free seasonings or herbs instead of table salt or sea salt. Check with your health care provider or pharmacist before using salt substitutes.  Do not fry foods. Cook foods using healthy methods such as baking, boiling, grilling, and broiling instead.  Cook with heart-healthy oils, such as olive, canola, soybean, or sunflower oil. Meal planning  Eat a balanced diet that includes: ? 5 or more servings of fruits and vegetables each day. At each meal, try to fill half of your plate with fruits and vegetables. ? Up to 6-8 servings of whole grains each day. ? Less than 6 oz of lean meat, poultry, or fish each day. A 3-oz serving of meat is about the same size as a deck of cards. One egg equals 1 oz. ? 2 servings of low-fat dairy each day. ? A serving of nuts, seeds, or beans 5 times each week. ? Heart-healthy fats. Healthy fats called Omega-3 fatty acids are found in foods such as flaxseeds and coldwater fish, like sardines, salmon, and mackerel.  Limit how much you eat of the following: ? Canned or prepackaged foods. ? Food that is high in trans fat, such as fried foods. ? Food that is high in saturated fat, such as fatty meat. ? Sweets, desserts, sugary drinks, and other foods with added sugar. ? Full-fat dairy products.  Do not salt foods before eating.  Try to eat at least 2 vegetarian meals each week.  Eat  more home-cooked food and less restaurant, buffet, and fast food.  When eating at a restaurant, ask that your food be prepared with less salt or no salt, if possible. What foods are recommended? The items listed may not be a complete list. Talk with your dietitian about what dietary choices are best for you. Grains Whole-grain or whole-wheat bread. Whole-grain or whole-wheat pasta. Brown rice. [place]. Bulgur. Whole-grain and low-sodium cereals. Pita bread. Low-fat, low-sodium crackers. Whole-wheat flour tortillas. Vegetables Fresh or frozen vegetables (raw, steamed,  roasted, or grilled). Low-sodium or reduced-sodium tomato and vegetable juice. Low-sodium or reduced-sodium tomato sauce and tomato paste. Low-sodium or reduced-sodium canned vegetables. Fruits All fresh, dried, or frozen fruit. Canned fruit in natural juice (without added sugar). Meat and other protein foods Skinless chicken or [place]. Ground chicken or [place]. Pork with fat trimmed off. Fish and seafood. Egg whites. Dried beans, peas, or lentils. Unsalted nuts, nut butters, and seeds. Unsalted canned beans. Lean cuts of beef with fat trimmed off. Low-sodium, lean deli meat. Dairy Low-fat (1%) or fat-free (skim) milk. Fat-free, low-fat, or reduced-fat cheeses. Nonfat, low-sodium ricotta or cottage cheese. Low-fat or nonfat yogurt. Low-fat, low-sodium cheese. Fats and oils Soft margarine without trans fats. Vegetable oil. Low-fat, reduced-fat, or light mayonnaise and salad dressings (reduced-sodium). Canola, safflower, olive, soybean, and sunflower oils. Avocado. Seasoning and other foods Herbs. Spices. Seasoning mixes without salt. Unsalted popcorn and pretzels. Fat-free sweets. What foods are not recommended? The items listed may not be a complete list. Talk with your dietitian about what dietary choices are best for you. Grains Baked goods made with fat, such as croissants, muffins, or some breads. Dry pasta or rice meal packs. Vegetables Creamed or fried vegetables. Vegetables in a cheese sauce. Regular canned vegetables (not low-sodium or reduced-sodium). Regular canned tomato sauce and paste (not low-sodium or reduced-sodium). Regular tomato and vegetable juice (not low-sodium or reduced-sodium). Angie Fava. Olives. Fruits Canned fruit in a light or heavy syrup. Fried fruit. Fruit in cream or butter sauce. Meat and other protein foods Fatty cuts of meat. Ribs. Fried meat. Berniece Salines. Sausage. Bologna and other processed lunch meats. Salami. Fatback. Hotdogs. Bratwurst. Salted nuts and seeds. Canned  beans with added salt. Canned or smoked fish. Whole eggs or egg yolks. Chicken or [place] with skin. Dairy Whole or 2% milk, cream, and half-and-half. Whole or full-fat cream cheese. Whole-fat or sweetened yogurt. Full-fat cheese. Nondairy creamers. Whipped toppings. Processed cheese and cheese spreads. Fats and oils Butter. Stick margarine. Lard. Shortening. Ghee. Bacon fat. Tropical oils, such as coconut, palm kernel, or palm oil. Seasoning and other foods Salted popcorn and pretzels. Onion salt, garlic salt, seasoned salt, table salt, and sea salt. Worcestershire sauce. Tartar sauce. Barbecue sauce. Teriyaki sauce. Soy sauce, including reduced-sodium. Steak sauce. Canned and packaged gravies. Fish sauce. Oyster sauce. Cocktail sauce. Horseradish that you find on the shelf. Ketchup. Mustard. Meat flavorings and tenderizers. Bouillon cubes. Hot sauce and Tabasco sauce. Premade or packaged marinades. Premade or packaged taco seasonings. Relishes. Regular salad dressings. Where to find more information:  National Heart, Lung, and [place]: https://wilson-eaton.com/  American Heart Association: www.heart.org Summary  The DASH eating plan is a healthy eating plan that has been shown to reduce high blood pressure (hypertension). It may also reduce your risk for type 2 diabetes, heart disease, and stroke.  With the DASH eating plan, you should limit salt (sodium) intake to 2,300 mg a day. If you have hypertension, you may need to reduce your sodium intake to  1,500 mg a day.  When on the DASH eating plan, aim to eat more fresh fruits and vegetables, whole grains, lean proteins, low-fat dairy, and heart-healthy fats.  Work with your health care provider or diet and nutrition specialist (dietitian) to adjust your eating plan to your individual calorie needs. This information is not intended to replace advice given to you by your health care provider. Make sure you discuss any questions you have with your  health care provider. Document Released: 12/16/2010 Document Revised: 12/21/2015 Document Reviewed: 12/21/2015 Elsevier Interactive Patient Education  2019 [employer].

## 2018-02-26 ENCOUNTER — Other Ambulatory Visit: Payer: Self-pay | Admitting: Internal Medicine

## 2018-02-26 MED ORDER — AMLODIPINE BESYLATE 5 MG PO TABS: 5.0000 mg | ORAL_TABLET | Freq: Every day | ORAL | 0 refills | Status: DC

## 2018-03-01 ENCOUNTER — Ambulatory Visit: Payer: Medicare Other | Admitting: Family

## 2018-03-02 ENCOUNTER — Ambulatory Visit (INDEPENDENT_AMBULATORY_CARE_PROVIDER_SITE_OTHER): Payer: Medicare Other | Admitting: Family

## 2018-03-02 ENCOUNTER — Encounter: Payer: Self-pay | Admitting: Family

## 2018-03-02 VITALS — BP 140/80 | HR 64 | Temp 98.4°F | Resp 18 | Ht 65.0 in | Wt 136.6 lb

## 2018-03-02 DIAGNOSIS — N183 Chronic kidney disease, stage 3 unspecified: Secondary | ICD-10-CM

## 2018-03-02 DIAGNOSIS — N3281 Overactive bladder: Secondary | ICD-10-CM

## 2018-03-02 DIAGNOSIS — I129 Hypertensive chronic kidney disease with stage 1 through stage 4 chronic kidney disease, or unspecified chronic kidney disease: Secondary | ICD-10-CM | POA: Diagnosis not present

## 2018-03-02 NOTE — Patient Instructions (Signed)

## 2018-03-02 NOTE — Progress Notes (Signed)
Provider: Leticia Coletta FNP-C  Gayland Curry, DO  Patient Care Team: Gayland Curry, DO as PCP - General (Geriatric Medicine) Leonie Man, MD as PCP - Cardiology (Cardiology)  Extended Emergency Contact Information Primary Emergency Contact: Chriss Driver [place] Phone: [phone] Relation: Daughter  Goals of care: Advanced Directive information Advanced Directives 03/02/2018  Does Patient Have a Medical Advance Directive? Yes  Type of Paramedic of [place];Living will  Does patient want to make changes to medical advance directive? No - Patient declined  Copy of [place] in Chart? Yes - validated most recent copy scanned in chart (See row information)  Would patient like information on creating a medical advance directive? -  Pre-existing out of facility DNR order (yellow form or pink MOST form) -     Chief Complaint  Patient presents with  . Follow-up    2 week follow up on blood pressure   . Medication Management    Would like samples of Mirabegron ER 25 mg tab    HPI:  Pt is a 78 y.o. female seen today for an acute visit for follow up high blood on 02/23/2018 190-200/90's.she was started on Amlodipine 5 mg tablet daily.she states has a situation that is stressing her but trying to get out of it.she denies any dizziness,headache,chest pain,shortness of breath or faintness.she checks her blood pressure at home.no log brought in to visit but states blood pressure readings are down to her baseline since starting on amlodipine.  OAB - she request mirabegron ER 25 mg tablet samples.    Past Medical History:  Diagnosis Date  . Benign neoplasm of colon   . Diabetes mellitus without complication (HCC)    V3X currently a goal  . Essential hypertension, benign   . GERD (gastroesophageal reflux disease)   . Hyperlipidemia    Followed by PCP  . Hypertension   . Lumbago   . Migraine, unspecified,  without mention of intractable migraine without mention of status migrainosus   . Osteoarthrosis, unspecified whether generalized or localized, unspecified site   . Palpitations    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  . Solitary pulmonary nodule 2012  . Urinary frequency    Past Surgical History:  Procedure Laterality Date  . ABDOMINAL HYSTERECTOMY    . CARDIAC CATHETERIZATION  11/15/1994   Patent coronary arteries and normal LV function  . CARDIAC CATHETERIZATION  11/03/2003   Normal coronary arteries  . COLONOSCOPY  2010   Dr.Bucinni, Normal   . COLONOSCOPY  2004   Dr.Bucinni  . EXERCISE STRESS TEST  11/12/1987   Positive  . SHOULDER SURGERY Right   . TRANSTHORACIC ECHOCARDIOGRAM  05/19/2010   EF >55%, normal-mild    Allergies  Allergen Reactions  . Azithromycin Hives  . Statins     Myalgias-high doses  . Tramadol Nausea Only  . Gemfibrozil Other (See Comments)    Leg cramps at night    Outpatient Encounter Medications as of 03/02/2018  Medication Sig  . acetaminophen (TYLENOL) 500 MG tablet Take 500 mg by mouth as needed for pain.  Marland Kitchen amLODipine (NORVASC) 5 MG tablet Take 1 tablet (5 mg total) by mouth at bedtime.  Marland Kitchen aspirin EC 81 MG tablet Take 81 mg by mouth daily.  . Cholecalciferol (VITAMIN D3) 2000 units CHEW Chew 1 capsule by mouth daily.  Marland Kitchen conjugated estrogens (PREMARIN) vaginal cream Pea sized amt PV 2 times per week for atrophic vaginitis  . losartan (COZAAR)  50 MG tablet Take 1 tablet (50 mg total) by mouth daily.  Marland Kitchen lovastatin (MEVACOR) 20 MG tablet TAKE ONE TABLET BY MOUTH AT BEDTIME  . metFORMIN (GLUCOPHAGE) 1000 MG tablet TAKE ONE TABLET (1000MG ) BY MOUTH TWICE DAILY WITH MEALS  . metoprolol tartrate (LOPRESSOR) 100 MG tablet Take 1 tablet (100 mg total) by mouth 2 (two) times daily. Take one by mouth twice daily.  . mirabegron ER (MYRBETRIQ) 25 MG TB24 tablet Take 1 tablet (25 mg total) by mouth daily.  . Multiple Vitamin (MULTIVITAMIN) tablet Take 1 tablet by  mouth daily.   . nitrofurantoin, macrocrystal-monohydrate, (MACROBID) 100 MG capsule Take 1 capsule (100 mg total) by mouth 2 (two) times daily.  . Omega-3 1000 MG CAPS Take 1 g by mouth.  Marland Kitchen omeprazole (PRILOSEC) 20 MG capsule Take 20 mg by mouth daily.  . vitamin B-12 (CYANOCOBALAMIN) 1000 MCG tablet Take 1,000 mcg by mouth daily.   No facility-administered encounter medications on file as of 03/02/2018.     Review of Systems  Constitutional: Negative for appetite change, chills, fatigue and fever.  HENT: Negative for congestion.   Eyes: Negative for discharge, redness, itching and visual disturbance.  Respiratory: Negative for cough, chest tightness, shortness of breath and wheezing.   Cardiovascular: Negative for chest pain and leg swelling.       Occasional chronic palpitation   Gastrointestinal: Negative for abdominal distention, abdominal pain, constipation, diarrhea, nausea and vomiting.  Skin: Negative for color change, pallor and rash.  Neurological: Negative for dizziness, light-headedness and headaches.    Immunization History  Administered Date(s) Administered  . Influenza, High Dose Seasonal PF 09/22/2016, 10/16/2017  . Influenza,inj,Quad PF,6+ Mos 11/20/2012, 10/03/2013, 11/20/2014  . Influenza-Unspecified 10/07/2010, 11/03/2011, 11/21/2015  . Pneumococcal Conjugate-13 12/31/2012  . Pneumococcal Polysaccharide-23 11/20/2014  . Tdap 09/03/2014   Pertinent  Health Maintenance Due  Topic Date Due  . OPHTHALMOLOGY EXAM  06/28/2016  . FOOT EXAM  01/23/2018  . HEMOGLOBIN A1C  08/15/2018  . INFLUENZA VACCINE  Completed  . DEXA SCAN  Completed  . PNA vac Low Risk Adult  Completed   Fall Risk  03/02/2018 02/15/2018 11/01/2017 11/01/2017 10/16/2017  Falls in the past year? 0 0 No No No  Comment - - - - -  Number falls in past yr: 0 0 - - -  Injury with Fall? 0 0 - - -   Functional Status Survey:    Vitals:   03/02/18 1134  BP: 140/80  Pulse: 64  Resp: 18  Temp:  98.4 F (36.9 C)  TempSrc: Oral  SpO2: 97%  Weight: 136 lb 9.6 oz (62 kg)  Height: 5\' 5"  (1.651 m)   Body mass index is 22.73 kg/m.   Labs reviewed: Recent Labs    10/10/17 1032 10/31/17 1517 02/14/18 0832  NA 139 138 141  K 4.2 3.8 4.3  CL 104 106 106  CO2 25 22 25   GLUCOSE 105* 97 105*  BUN 16 11 16   CREATININE 1.08* 1.04* 1.10*  CALCIUM 9.4 9.4 9.6   No results for input(s): AST, ALT, ALKPHOS, BILITOT, PROT, ALBUMIN in the last 8760 hours. Recent Labs    10/31/17 1517 02/14/18 0832  WBC 6.8 6.5  NEUTROABS  --  3,829  HGB 13.6 13.4  HCT 41.4 40.6  MCV 93.0 91.2  PLT 335 319   Lab Results  Component Value Date   TSH 2.18 05/23/2017   Lab Results  Component Value Date   HGBA1C 6.3 (H) 02/14/2018  Lab Results  Component Value Date   CHOL 149 10/10/2017   HDL 41 (L) 10/10/2017   LDLCALC 80 10/10/2017   TRIG 185 (H) 10/10/2017   CHOLHDL 3.6 10/10/2017    Significant Diagnostic Results in last 30 days:  No results found.  Assessment/Plan 1. Benign hypertension with CKD (chronic kidney disease) stage III (HCC) B/p reviewed has improved from previous visit.asymptomatic.continue on Amlodipine 5 mg tablet daily,losartan 50 mg tablet daily and metoprolol 100 mg tablet twice daily.on ASA and Statin for cardiovascular event prophylaxis.  2. Overactive bladder On myrbetriq 25 mg ER tablet daily.request samples  due to cost.samples # 14 given.    Family/ staff Communication: Reviewed plan of care with patient.   Labs/tests ordered: None   Follow up : 6 months with Dr.Reed for medical management of chronic issues    Sandrea Hughs, NP

## 2018-03-14 ENCOUNTER — Other Ambulatory Visit: Payer: Self-pay

## 2018-03-14 NOTE — Patient Outreach (Signed)
[facility]) Care Management  03/14/2018  Mandy Perry 1940-06-12 [id number]   Medication Adherence call to [name] Compliant Voice message left with a call back number. Telephone call to Patient regarding Medication Adherence unable to reach patient. [name] is showing past due on Lovastatin 20 mg and Metformin 1000 under [place].   [facility] Management Direct Dial [phone]  Fax [phone] [email or web address]

## 2018-03-26 ENCOUNTER — Other Ambulatory Visit: Payer: Self-pay

## 2018-03-26 NOTE — Patient Outreach (Signed)
[facility]) Care Management  03/26/2018  Mandy Perry 1940-02-01 [id number]   Medication Adherence call to Mrs. Mandy Perry spoke with patient she is due on Metformin 1000 mg she explain she just pick up this medication from [place] for a 90 days supply but wants to start receiving it from [facility]  she will call [facility]  and will transfer all her medication there. Mandy Perry is showing past due under [facility].   [facility] Management Direct Dial [phone]  Fax [phone] [email or web address]

## 2018-04-17 ENCOUNTER — Other Ambulatory Visit: Payer: Self-pay | Admitting: Internal Medicine

## 2018-04-17 ENCOUNTER — Other Ambulatory Visit: Payer: Self-pay | Admitting: Nurse Practitioner

## 2018-04-17 DIAGNOSIS — I10 Essential (primary) hypertension: Secondary | ICD-10-CM

## 2018-07-11 ENCOUNTER — Telehealth: Payer: Self-pay | Admitting: Cardiology

## 2018-07-11 NOTE — Telephone Encounter (Signed)
° ° °  COVID-19 Pre-Screening Questions:   In the past 7 to 10 days have you had a cough,  shortness of breath, headache, congestion, fever (100 or greater) body aches, chills, sore throat, or sudden loss of taste or sense of smell? no  Have you been around anyone with known Covid 19. no  Have you been around anyone who is awaiting Covid 19 test results in the past 7 to 10 days? no  Have you been around anyone who has been exposed to Covid 19, or has mentioned symptoms of Covid 19 within the past 7 to 10 days?no  If you have any concerns/questions about symptoms patients report during screening (either on the phone or at [facility]). Contact the provider seeing the patient or DOD for further guidance.  If neither are available contact a member of the leadership team.  I called to confirm her appt for 07-12-18 with Dr Ellyn Hack.

## 2018-07-12 ENCOUNTER — Other Ambulatory Visit: Payer: Self-pay

## 2018-07-12 ENCOUNTER — Encounter: Payer: Self-pay | Admitting: Cardiology

## 2018-07-12 ENCOUNTER — Ambulatory Visit (INDEPENDENT_AMBULATORY_CARE_PROVIDER_SITE_OTHER): Payer: Medicare Other | Admitting: Cardiology

## 2018-07-12 VITALS — BP 122/70 | HR 69 | Temp 97.5°F | Ht 65.0 in | Wt 140.0 lb

## 2018-07-12 DIAGNOSIS — I10 Essential (primary) hypertension: Secondary | ICD-10-CM | POA: Diagnosis not present

## 2018-07-12 DIAGNOSIS — Z8679 Personal history of other diseases of the circulatory system: Secondary | ICD-10-CM | POA: Diagnosis not present

## 2018-07-12 DIAGNOSIS — R002 Palpitations: Secondary | ICD-10-CM

## 2018-07-12 MED ORDER — METOPROLOL TARTRATE 100 MG PO TABS: 100.0000 mg | ORAL_TABLET | Freq: Two times a day (BID) | ORAL | 3 refills | Status: DC

## 2018-07-12 NOTE — Patient Instructions (Addendum)
Medication Instructions:    CONTINUE TAKING METOPROLOL TARTRATE 100 MG TWICE A DAY  IF YOU NOTICE  EPISODES  OF PALPATIONS AT NIGHT- THE NEXT DAY TAKE  1/2 TABLET OF 100 MG ( EQUAL 50 MG)  AND THEN TAKE THE OTHER  1/2 OF 100 MG  WITH THE NIGHT DOSE ( WHICH WILL BE A TOTAL OF 150 MG ) If you need a refill on your cardiac medications before your next appointment, please call your pharmacy.   Lab work:  NOT NEEDED  Testing/Procedures:  NOT NEEDED  Follow-Up: At [facility], you and your health needs are our priority.  As part of our continuing mission to provide you with exceptional heart care, we have created designated Provider Care Teams.  These Care Teams include your primary Cardiologist (physician) and Advanced Practice Providers (APPs -  Physician Assistants and Nurse Practitioners) who all work together to provide you with the care you need, when you need it. . You will need a follow up appointment in  12 months July 2021.  Please call our office 2 months in advance to schedule this appointment.  You may see Glenetta Hew, MD or one of the following Advanced Practice Providers on your designated Care Team:   . Rosaria Ferries, PA-C . Jory Sims, DNP, ANP  Any Other Special Instructions Will Be Listed Below (If Applicable).

## 2018-07-12 NOTE — Progress Notes (Signed)
PCP: Gayland Curry, DO  Clinic Note: Chief Complaint  Patient presents with  . Follow-up    fast heart beats  . Shortness of Breath    Whenever her heart skips a beat.    HPI: Mandy Perry is a 78 y.o. female with a PMH below (notable for PSVT) who presents today for ~ 6 month f/u. I have not seen her since Sept 2018.  Mandy Perry was last seen on Jan 15, 2018 by Jory Sims, DNP -- for f/u from SVT episodes - (metoprolol was 100mg  AM- 50 mg PM --> but she understood it to be 100 mg BID -- unfortunately Rx was not done that way)   Recent Hospitalizations: none  Studies Personally Reviewed - (if available, images/films reviewed: From Epic Chart or Care Everywhere)  none  Interval History: Mandy Perry is here today telling me that she has been having a lot more of these irregular heartbeat episodes over the last couple months.  When she was taking 100 mg twice daily metoprolol, it seemed to be better, but now that she did have the stretch her prescription out, she is noticing more of the palpitations.  They are not prolonged and not necessarily associated with any lightheadedness or dizziness, just an irregular sensation.  Maybe lasting a few seconds to maybe a minute at the most.  When they occur, she has noticed some chest pain, but not otherwise.  She only notes these episodes when she is lying down at night, not at all associated with exertion or stress.  With exception of the chest pain that is fleeting associated with palpitations, no resting or exertional chest pain or pressure.  No exertional dyspnea. No PND, orthopnea or edema.  No dizziness, weakness or syncope/near syncope. No TIA/amaurosis fugax symptoms. No claudication.  ROS: Some mild occasional anxiety and jittering sensations.  Otherwise no notable GI symptoms no bleeding issues.  The patient does not have symptoms concerning for COVID-19 infection (fever, chills, cough, or new shortness of breath).  The  patient is practicing social distancing.   COVID-19 Education: The signs and symptoms of COVID-19 were discussed with the patient and how to seek care for testing (follow up with PCP or arrange E-visit).   The importance of social distancing was discussed today.   I have reviewed and (if needed) personally updated the patient's problem list, medications, allergies, past medical and surgical history, social and family history.   Past Medical History:  Diagnosis Date  . Benign neoplasm of colon   . Diabetes mellitus without complication (HCC)    S8N currently a goal  . Essential hypertension, benign   . GERD (gastroesophageal reflux disease)   . Hyperlipidemia    Followed by PCP  . Hypertension   . Lumbago   . Migraine, unspecified, without mention of intractable migraine without mention of status migrainosus   . Osteoarthrosis, unspecified whether generalized or localized, unspecified site   . Palpitations    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  . Solitary pulmonary nodule 2012  . Urinary frequency     Past Surgical History:  Procedure Laterality Date  . ABDOMINAL HYSTERECTOMY    . CARDIAC CATHETERIZATION  11/15/1994   Patent coronary arteries and normal LV function  . CARDIAC CATHETERIZATION  11/03/2003   Normal coronary arteries  . COLONOSCOPY  2010   Dr.Bucinni, Normal   . COLONOSCOPY  2004   Dr.Bucinni  . EXERCISE STRESS TEST  11/12/1987   Positive  . SHOULDER SURGERY Right   .  TRANSTHORACIC ECHOCARDIOGRAM  05/19/2010   EF >55%, normal-mild    Current Meds  Medication Sig  . acetaminophen (TYLENOL) 500 MG tablet Take 500 mg by mouth as needed for pain.  Marland Kitchen amLODipine (NORVASC) 5 MG tablet TAKE 1 TABLET BY MOUTH EVERYDAY AT BEDTIME  . aspirin EC 81 MG tablet Take 81 mg by mouth daily.  Marland Kitchen conjugated estrogens (PREMARIN) vaginal cream Pea sized amt PV 2 times per week for atrophic vaginitis  . losartan (COZAAR) 50 MG tablet Take 1 tablet (50 mg total) by mouth daily.  Marland Kitchen  lovastatin (MEVACOR) 20 MG tablet TAKE ONE TABLET (20MG ) BY MOUTH AT BEDTIME  . metFORMIN (GLUCOPHAGE) 1000 MG tablet TAKE ONE TABLET (1000MG ) BY MOUTH TWICE DAILY WITH MEALS  . metoprolol tartrate (LOPRESSOR) 100 MG tablet Take 1 tablet (100 mg total) by mouth 2 (two) times daily.  . mirabegron ER (MYRBETRIQ) 25 MG TB24 tablet Take 1 tablet (25 mg total) by mouth daily.  . Multiple Vitamin (MULTIVITAMIN) tablet Take 1 tablet by mouth daily.   . Omega-3 1000 MG CAPS Take 1 g by mouth.  . vitamin B-12 (CYANOCOBALAMIN) 1000 MCG tablet Take 1,000 mcg by mouth daily.  . [DISCONTINUED] Cholecalciferol (VITAMIN D3) 2000 units CHEW Chew 1 capsule by mouth daily.  . [DISCONTINUED] metoprolol tartrate (LOPRESSOR) 100 MG tablet Take 1 tablet (100 mg total) by mouth 2 (two) times daily. Take one by mouth twice daily.    Allergies  Allergen Reactions  . Azithromycin Hives  . Statins     Myalgias-high doses  . Tramadol Nausea Only  . Gemfibrozil Other (See Comments)    Leg cramps at night    Social History   Tobacco Use  . Smoking status: Former Smoker    Packs/day: 0.50    Years: 20.00    Pack years: 10.00    Types: Cigarettes    Quit date: 01/10/1977    Years since quitting: 41.5  . Smokeless tobacco: Never Used  . Tobacco comment: Quit at age 18  Substance Use Topics  . Alcohol use: No  . Drug use: No   Social History   Social History Narrative   She is a married,  Widowed -husband died 04-25-14   Retired Marine scientist    mother of 82, grandmother 32.    She had 10 years a history of about a half pack a day but quit 10 years ago.    Her exercising is limited by her osteoarthritis pains. But she tries to do some of the exercises with walking on a daily basis.   Alcohol none    family history includes COPD in her brother; CVA in her mother; Cancer in her brother and father; Colon cancer in her sister; Diabetes in her sister and sister; Heart attack in her father.  Wt Readings from Last 3  Encounters:  07/12/18 140 lb (63.5 kg)  03/02/18 136 lb 9.6 oz (62 kg)  02/23/18 136 lb (61.7 kg)    PHYSICAL EXAM BP 122/70 (BP Location: Left Arm, Patient Position: Sitting, Cuff Size: Normal)   Pulse 69   Temp (!) 97.5 F (36.4 C)   Ht 5\' 5"  (1.651 m)   Wt 140 lb (63.5 kg)   BMI 23.30 kg/m  Physical Exam   Adult ECG Report  Rate: 69 ;  Rhythm: normal sinus rhythm and LVH with repolarization;   Narrative Interpretation: Relatively stable EKG.   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  Nothing that has lasted more than a minute or 2.  I think this is probably because she is on high-dose beta-blocker.  We also discussed vagal maneuvers for prolonged spells. Stay adequately hydrated.      Relevant Orders   EKG 12-Lead   Heart palpitations (Chronic)    I suspect she is feeling PACs, PVCs or even probably probably short runs of PAT.  May be some bigeminy but nothing is prolonged.  Seem to be doing better with higher dose of beta-blocker. Plan: Increase to 100 mg twice daily metoprolol and allow for additional 50 mg as needed.  If symptoms become much worse, may consider adding low-dose flecainide.      Relevant Orders   EKG 12-Lead   Essential hypertension (Chronic)    Blood pressures  well controlled on current dose of metoprolol and amlodipine plus ARB.      Relevant Medications   metoprolol tartrate (LOPRESSOR) 100 MG tablet       I spent a total of 55minutes with the patient and chart review. >  50% of the time was spent in direct patient consultation.   Current medicines are reviewed at length with the patient today.  (+/- concerns) n/a The following changes have been made:  see below  Patient Instructions  Medication Instructions:    CONTINUE TAKING METOPROLOL TARTRATE 100 MG TWICE A DAY  IF YOU NOTICE  EPISODES  OF PALPATIONS AT NIGHT- THE NEXT DAY TAKE  1/2 TABLET OF 100 MG ( EQUAL 50 MG)  AND THEN TAKE THE OTHER  1/2 OF 100 MG  WITH THE NIGHT DOSE ( WHICH WILL BE A TOTAL OF 150 MG ) If you need a refill on your cardiac medications before your next appointment, please call your pharmacy.   Lab work:  NOT NEEDED  Testing/Procedures:  NOT NEEDED  Follow-Up: At [facility], you and your health needs are our priority.  As part of our continuing mission to provide you with exceptional heart care, we have created designated Provider Care Teams.  These Care Teams include your primary Cardiologist (physician) and Advanced Practice Providers (APPs -  Physician Assistants and Nurse Practitioners) who all work together to provide you with the care you need, when you need it. . You will need a follow up appointment in  12 months July 2021.  Please call our office 2 months in advance to schedule this appointment.  You may see Glenetta Hew, MD or one of the following Advanced Practice Providers on your designated Care Team:   . Rosaria Ferries, PA-C . Jory Sims, DNP, ANP  Any Other Special Instructions Will Be Listed Below (If Applicable).    Studies Ordered:   Orders Placed This Encounter  Procedures  . EKG  12-Lead      Glenetta Hew, M.D., M.S. Interventional Cardiologist   Pager # [phone] Phone # [phone] [address] [place], [place] [address]   Thank you for choosing Heartcare at [facility]!!

## 2018-07-14 ENCOUNTER — Encounter: Payer: Self-pay | Admitting: Cardiology

## 2018-07-14 NOTE — Assessment & Plan Note (Signed)
I suspect she is feeling PACs, PVCs or even probably probably short runs of PAT.  May be some bigeminy but nothing is prolonged.  Seem to be doing better with higher dose of beta-blocker. Plan: Increase to 100 mg twice daily metoprolol and allow for additional 50 mg as needed.  If symptoms become much worse, may consider adding low-dose flecainide.

## 2018-07-14 NOTE — Assessment & Plan Note (Signed)
She is definitely has had prolonged episodes of SVT, but I suspect that the symptoms she is feeling now are more shortened either SVT or PAT episodes.  Nothing that has lasted more than a minute or 2.  I think this is probably because she is on high-dose beta-blocker.  We also discussed vagal maneuvers for prolonged spells. Stay adequately hydrated.

## 2018-07-14 NOTE — Assessment & Plan Note (Signed)
Blood pressures well controlled on current dose of metoprolol and amlodipine plus ARB.

## 2018-08-05 ENCOUNTER — Other Ambulatory Visit: Payer: Self-pay | Admitting: Internal Medicine

## 2018-08-20 ENCOUNTER — Encounter: Payer: Self-pay | Admitting: Family

## 2018-08-20 ENCOUNTER — Other Ambulatory Visit: Payer: Self-pay

## 2018-08-20 ENCOUNTER — Ambulatory Visit (INDEPENDENT_AMBULATORY_CARE_PROVIDER_SITE_OTHER): Payer: Medicare Other | Admitting: Family

## 2018-08-20 VITALS — BP 142/78 | HR 70 | Temp 98.1°F | Ht 65.0 in | Wt 140.2 lb

## 2018-08-20 DIAGNOSIS — R399 Unspecified symptoms and signs involving the genitourinary system: Secondary | ICD-10-CM

## 2018-08-20 DIAGNOSIS — R350 Frequency of micturition: Secondary | ICD-10-CM

## 2018-08-20 LAB — POCT URINALYSIS DIPSTICK
Glucose, UA: NEGATIVE
Ketones, UA: NEGATIVE
Nitrite, UA: NEGATIVE
Protein, UA: POSITIVE — AB
Spec Grav, UA: 1.015 (ref 1.010–1.025)
Urobilinogen, UA: 0.2 E.U./dL
pH, UA: 5 (ref 5.0–8.0)

## 2018-08-20 MED ORDER — CRANBERRY 475 MG PO CAPS: 475.0000 mg | ORAL_CAPSULE | Freq: Two times a day (BID) | ORAL | 3 refills | Status: DC

## 2018-08-20 MED ORDER — CIPROFLOXACIN HCL 500 MG PO TABS: 500.0000 mg | ORAL_TABLET | Freq: Two times a day (BID) | ORAL | 0 refills | Status: AC

## 2018-08-20 NOTE — Patient Instructions (Signed)
Urinary Tract Infection, Adult A urinary tract infection (UTI) is an infection of any part of the urinary tract. The urinary tract includes:  The kidneys.  The ureters.  The bladder.  The urethra. These organs make, store, and get rid of pee (urine) in the body. What are the causes? This is caused by germs (bacteria) in your genital area. These germs grow and cause swelling (inflammation) of your urinary tract. What increases the risk? You are more likely to develop this condition if:  You have a small, thin tube (catheter) to drain pee.  You cannot control when you pee or poop (incontinence).  You are female, and: ? You use these methods to prevent pregnancy: ? A medicine that kills sperm (spermicide). ? A device that blocks sperm (diaphragm). ? You have low levels of a female hormone (estrogen). ? You are pregnant.  You have genes that add to your risk.  You are sexually active.  You take antibiotic medicines.  You have trouble peeing because of: ? A prostate that is bigger than normal, if you are female. ? A blockage in the part of your body that drains pee from the bladder (urethra). ? A kidney stone. ? A nerve condition that affects your bladder (neurogenic bladder). ? Not getting enough to drink. ? Not peeing often enough.  You have other conditions, such as: ? Diabetes. ? A weak disease-fighting system (immune system). ? Sickle cell disease. ? Gout. ? Injury of the spine. What are the signs or symptoms? Symptoms of this condition include:  Needing to pee right away (urgently).  Peeing often.  Peeing small amounts often.  Pain or burning when peeing.  Blood in the pee.  Pee that smells bad or not like normal.  Trouble peeing.  Pee that is cloudy.  Fluid coming from the vagina, if you are female.  Pain in the belly or lower back. Other symptoms include:  Throwing up (vomiting).  No urge to eat.  Feeling mixed up (confused).  Being tired  and grouchy (irritable).  A fever.  Watery poop (diarrhea). How is this treated? This condition may be treated with:  Antibiotic medicine.  Other medicines.  Drinking enough water. Follow these instructions at home:  Medicines  Take over-the-counter and prescription medicines only as told by your doctor.  If you were prescribed an antibiotic medicine, take it as told by your doctor. Do not stop taking it even if you start to feel better. General instructions  Make sure you: ? Pee until your bladder is empty. ? Do not hold pee for a long time. ? Empty your bladder after sex. ? Wipe from front to back after pooping if you are a female. Use each tissue one time when you wipe.  Drink enough fluid to keep your pee pale yellow.  Keep all follow-up visits as told by your doctor. This is important. Contact a doctor if:  You do not get better after 1-2 days.  Your symptoms go away and then come back. Get help right away if:  You have very bad back pain.  You have very bad pain in your lower belly.  You have a fever.  You are sick to your stomach (nauseous).  You are throwing up. Summary  A urinary tract infection (UTI) is an infection of any part of the urinary tract.  This condition is caused by germs in your genital area.  There are many risk factors for a UTI. These include having a small, thin   tube to drain pee and not being able to control when you pee or poop.  Treatment includes antibiotic medicines for germs.  Drink enough fluid to keep your pee pale yellow. This information is not intended to replace advice given to you by your health care provider. Make sure you discuss any questions you have with your health care provider. Document Released: 06/15/2007 Document Revised: 12/14/2017 Document Reviewed: 07/06/2017 Elsevier Patient Education  2020 [employer]  

## 2018-08-20 NOTE — Progress Notes (Signed)
Provider: Dinah Ngetich FNP-C  Gayland Curry, DO  Patient Care Team: Gayland Curry, DO as PCP - General (Geriatric Medicine) Leonie Man, MD as PCP - Cardiology (Cardiology)  Extended Emergency Contact Information Primary Emergency Contact: Chriss Driver [place] of [place] Phone: [phone] Relation: Daughter  Code Status:   Goals of care: Advanced Directive information Advanced Directives 03/02/2018  Does Patient Have a Medical Advance Directive? Yes  Type of Paramedic of [place];Living will  Does patient want to make changes to medical advance directive? No - Patient declined  Copy of [place] in Chart? Yes - validated most recent copy scanned in chart (See row information)  Would patient like information on creating a medical advance directive? -  Pre-existing out of facility DNR order (yellow form or pink MOST form) -     Chief Complaint  Patient presents with  . Acute Visit    possible uti patient states she feels it started yesterday, running to bathroom frequently and still feels like she needs to go.and pain when urinating     HPI:  Pt is a 78 y.o. female seen today  for an acute visit for evaluation of possible urinary tract infections since yesterday.she states having pain when urinating, running to the bathroom frequently and still feels like she needs to go.Also having some lower abdominal discomfort.she denies any fever or chills.she has chronic lower back pain.     Past Medical History:  Diagnosis Date  . Benign neoplasm of colon   . Diabetes mellitus without complication (HCC)    P1W currently a goal  . Essential hypertension, benign   . GERD (gastroesophageal reflux disease)   . Hyperlipidemia    Followed by PCP  . Hypertension   . Lumbago   . Migraine, unspecified, without mention of intractable migraine without mention of status migrainosus   . Osteoarthrosis, unspecified whether  generalized or localized, unspecified site   . Palpitations    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  . Solitary pulmonary nodule 2012  . Urinary frequency    Past Surgical History:  Procedure Laterality Date  . ABDOMINAL HYSTERECTOMY    . CARDIAC CATHETERIZATION  11/15/1994   Patent coronary arteries and normal LV function  . CARDIAC CATHETERIZATION  11/03/2003   Normal coronary arteries  . COLONOSCOPY  2010   Dr.Bucinni, Normal   . COLONOSCOPY  2004   Dr.Bucinni  . EXERCISE STRESS TEST  11/12/1987   Positive  . SHOULDER SURGERY Right   . TRANSTHORACIC ECHOCARDIOGRAM  05/19/2010   EF >55%, normal-mild    Allergies  Allergen Reactions  . Azithromycin Hives  . Statins     Myalgias-high doses  . Tramadol Nausea Only  . Gemfibrozil Other (See Comments)    Leg cramps at night    Outpatient Encounter Medications as of 08/20/2018  Medication Sig  . acetaminophen (TYLENOL) 500 MG tablet Take 500 mg by mouth as needed for pain.  Marland Kitchen amLODipine (NORVASC) 5 MG tablet TAKE 1 TABLET BY MOUTH EVERYDAY AT BEDTIME  . aspirin EC 81 MG tablet Take 81 mg by mouth daily.  Marland Kitchen conjugated estrogens (PREMARIN) vaginal cream Pea sized amt PV 2 times per week for atrophic vaginitis  . losartan (COZAAR) 25 MG tablet TAKE 2 TABLETS BY MOUTH EVERY DAY  . lovastatin (MEVACOR) 20 MG tablet TAKE ONE TABLET (20MG ) BY MOUTH AT BEDTIME  . metFORMIN (GLUCOPHAGE) 1000 MG tablet TAKE ONE TABLET (1000MG ) BY MOUTH TWICE DAILY WITH  MEALS  . metoprolol tartrate (LOPRESSOR) 100 MG tablet Take 1 tablet (100 mg total) by mouth 2 (two) times daily.  . mirabegron ER (MYRBETRIQ) 25 MG TB24 tablet Take 1 tablet (25 mg total) by mouth daily.  . Multiple Vitamin (MULTIVITAMIN) tablet Take 1 tablet by mouth daily.   . Omega-3 1000 MG CAPS Take 1 g by mouth.  . vitamin B-12 (CYANOCOBALAMIN) 1000 MCG tablet Take 1,000 mcg by mouth daily.   No facility-administered encounter medications on file as of 08/20/2018.     Review of Systems   Constitutional: Negative for appetite change, chills, fatigue and fever.  HENT: Negative for congestion, rhinorrhea, sinus pressure, sinus pain, sneezing and sore throat.   Respiratory: Negative for cough, chest tightness, shortness of breath and wheezing.   Cardiovascular: Negative for chest pain, palpitations and leg swelling.  Gastrointestinal: Negative for abdominal distention, abdominal pain, constipation, diarrhea, nausea and vomiting.  Genitourinary: Positive for dysuria, frequency and urgency. Negative for hematuria.  Musculoskeletal: Positive for back pain. Negative for gait problem.  Skin: Negative for color change, pallor and rash.  Neurological: Negative for dizziness, light-headedness and headaches.  Psychiatric/Behavioral: Negative for agitation and sleep disturbance. The patient is not nervous/anxious.     Immunization History  Administered Date(s) Administered  . Influenza, High Dose Seasonal PF 09/22/2016, 10/16/2017  . Influenza,inj,Quad PF,6+ Mos 11/20/2012, 10/03/2013, 11/20/2014  . Influenza-Unspecified 10/07/2010, 11/03/2011, 11/21/2015  . Pneumococcal Conjugate-13 12/31/2012  . Pneumococcal Polysaccharide-23 11/20/2014  . Tdap 09/03/2014   Pertinent  Health Maintenance Due  Topic Date Due  . OPHTHALMOLOGY EXAM  06/28/2016  . FOOT EXAM  01/23/2018  . INFLUENZA VACCINE  08/11/2018  . HEMOGLOBIN A1C  08/15/2018  . DEXA SCAN  Completed  . PNA vac Low Risk Adult  Completed   Fall Risk  08/20/2018 03/02/2018 02/15/2018 11/01/2017 11/01/2017  Falls in the past year? 0 0 0 No No  Comment - - - - -  Number falls in past yr: 0 0 0 - -  Injury with Fall? 0 0 0 - -      Vitals:   08/20/18 1424  BP: (!) 142/78  Pulse: 70  Temp: 98.1 F (36.7 C)  TempSrc: Oral  SpO2: 98%  Weight: 140 lb 3.2 oz (63.6 kg)  Height: 5\' 5"  (1.651 m)   Body mass index is 23.33 kg/m. Physical Exam Constitutional:      General: She is not in acute distress.    Appearance: She is  normal weight. She is not ill-appearing.  HENT:     Mouth/Throat:     Mouth: Mucous membranes are moist.     Pharynx: Oropharynx is clear. No oropharyngeal exudate or posterior oropharyngeal erythema.  Eyes:     General: No scleral icterus.       Right eye: No discharge.        Left eye: No discharge.     Conjunctiva/sclera: Conjunctivae normal.     Pupils: Pupils are equal, round, and reactive to light.  Cardiovascular:     Rate and Rhythm: Normal rate and regular rhythm.     Pulses: Normal pulses.     Heart sounds: Normal heart sounds. No murmur. No friction rub. No gallop.   Pulmonary:     Effort: Pulmonary effort is normal. No respiratory distress.     Breath sounds: Normal breath sounds. No wheezing, rhonchi or rales.  Chest:     Chest wall: No tenderness.  Abdominal:     General: Bowel sounds are  normal. There is no distension.     Palpations: Abdomen is soft. There is no mass.     Tenderness: There is no abdominal tenderness. There is no right CVA tenderness, left CVA tenderness, guarding or rebound.  Musculoskeletal: Normal range of motion.        General: No swelling.     Right lower leg: No edema.     Left lower leg: No edema.  Skin:    General: Skin is warm and dry.     Coloration: Skin is not pale.     Findings: No erythema or rash.  Neurological:     Mental Status: She is alert and oriented to person, place, and time.     Cranial Nerves: No cranial nerve deficit.     Sensory: No sensory deficit.     Motor: No weakness.     Gait: Gait normal.  Psychiatric:        Mood and Affect: Mood normal.        Behavior: Behavior normal.        Thought Content: Thought content normal.        Judgment: Judgment normal.    Labs reviewed: Recent Labs    10/10/17 1032 10/31/17 1517 02/14/18 0832  NA 139 138 141  K 4.2 3.8 4.3  CL 104 106 106  CO2 25 22 25   GLUCOSE 105* 97 105*  BUN 16 11 16   CREATININE 1.08* 1.04* 1.10*  CALCIUM 9.4 9.4 9.6   No results for  input(s): AST, ALT, ALKPHOS, BILITOT, PROT, ALBUMIN in the last 8760 hours. Recent Labs    10/31/17 1517 02/14/18 0832  WBC 6.8 6.5  NEUTROABS  --  3,829  HGB 13.6 13.4  HCT 41.4 40.6  MCV 93.0 91.2  PLT 335 319   Lab Results  Component Value Date   TSH 2.18 05/23/2017   Lab Results  Component Value Date   HGBA1C 6.3 (H) 02/14/2018   Lab Results  Component Value Date   CHOL 149 10/10/2017   HDL 41 (L) 10/10/2017   LDLCALC 80 10/10/2017   TRIG 185 (H) 10/10/2017   CHOLHDL 3.6 10/10/2017    Significant Diagnostic Results in last 30 days:  No results found.  Assessment/Plan  Urinary frequency Afebrile.Has had increased urine frequency,dysuria and urgency.Suprabupic tenderness to palpation.  - POC Urinalysis Dipstick indicates cloudy urine,positive protein,large leukocytes and negative nitrites.she would like to start on antibiotics.she aware if cultures are not sensitivity to antibiotic she will have to start on another one.  - Cipro 500 mg tablet one by mouth twice daily x 7 days she is currently on Align 4 mg capsule one by mouth daily prescribed by Gastrologist. - Cranberry 475 mg tablet one by mouth twice daily x 7 days for UTI prevention.  - Encouraged to increase fluid intake.UTI education information provided on AVS.    Family/ staff Communication: Reviewed plan of care with patient.  Labs/tests ordered:  - POC Urinalysis Dipstick - Urine Culture   Nelda Bucks Ngetich, NP

## 2018-08-22 LAB — URINE CULTURE
MICRO NUMBER:: 754415
SPECIMEN QUALITY:: ADEQUATE

## 2018-08-30 ENCOUNTER — Ambulatory Visit (INDEPENDENT_AMBULATORY_CARE_PROVIDER_SITE_OTHER): Payer: Medicare Other | Admitting: Internal Medicine

## 2018-08-30 ENCOUNTER — Other Ambulatory Visit: Payer: Self-pay

## 2018-08-30 ENCOUNTER — Encounter: Payer: Self-pay | Admitting: Internal Medicine

## 2018-08-30 VITALS — BP 138/72 | HR 69 | Temp 97.9°F | Ht 65.0 in | Wt 137.0 lb

## 2018-08-30 DIAGNOSIS — N183 Chronic kidney disease, stage 3 unspecified: Secondary | ICD-10-CM

## 2018-08-30 DIAGNOSIS — N3281 Overactive bladder: Secondary | ICD-10-CM

## 2018-08-30 DIAGNOSIS — E1122 Type 2 diabetes mellitus with diabetic chronic kidney disease: Secondary | ICD-10-CM | POA: Diagnosis not present

## 2018-08-30 DIAGNOSIS — E782 Mixed hyperlipidemia: Secondary | ICD-10-CM

## 2018-08-30 DIAGNOSIS — E1169 Type 2 diabetes mellitus with other specified complication: Secondary | ICD-10-CM

## 2018-08-30 DIAGNOSIS — K2100 Gastro-esophageal reflux disease with esophagitis, without bleeding: Secondary | ICD-10-CM

## 2018-08-30 DIAGNOSIS — Z23 Encounter for immunization: Secondary | ICD-10-CM

## 2018-08-30 DIAGNOSIS — I129 Hypertensive chronic kidney disease with stage 1 through stage 4 chronic kidney disease, or unspecified chronic kidney disease: Secondary | ICD-10-CM

## 2018-08-30 DIAGNOSIS — I10 Essential (primary) hypertension: Secondary | ICD-10-CM

## 2018-08-30 DIAGNOSIS — M8580 Other specified disorders of bone density and structure, unspecified site: Secondary | ICD-10-CM

## 2018-08-30 NOTE — Progress Notes (Signed)
Location:  [facility]   Advanced Directives 03/02/2018  Does Patient Have a Medical Advance Directive? Yes  Type of Paramedic of [place];Living will  Does patient want to make changes to medical advance directive? No - Patient declined  Copy of [place] in Chart? Yes - validated most recent copy scanned in chart (See row information)  Would patient like information on creating a medical advance directive? -  Pre-existing out of facility DNR order (yellow form or pink MOST form) -     Chief Complaint  Patient presents with  . Medical Management of Chronic Issues    6MTH FOLLOW-UP    HPI: Patient is a 78 y.o. female seen today for medical management of chronic diseases.    10 days ago she was seen for possible UTI. Since last visit, urinary symptoms have subsided. She has completed all of her antibiotics, urine culture was negative. She is drinking 4-6 glasses of water daily and taking a cranberry tablet to prevent future episodes.   She continues to struggle with hip and knee pain. Her pain is worse in the AM and improves throughout the day. Tylenol dosage has increased to 650 mg two times a day when her pain is greater than 5/10. She is also using aspercreme for pain daily. No recent falls or injuries. Ambulates with no assistive device.   She recently got a new puppy. Her activity has increased since this occurrence. She admits to light walking daily.   GERD has not subsided and episodes occur during the day and night. She is taking omeprazole 20 mg daily and states it helps. She will take a tums for severe episodes She has not taken a tums in over a month. Also avoiding trigger foods like tomatoes and caffeine. She refrains from eating at least two hours before bedtime.   She eats three meals a day. Admits to eating sweets daily like a cookie or piece of cake.   Takes her metformin everyday. Worried about metformin hurting her kidneys.  Does not check blood glucose daily. Has not had any episodes of hypoglycemia since last visit.   Overactive bladder controlled with Myrbetriq daily. No incontinent episodes.   Sleeping 6-7 hours a night  Last [facility] exam April, 2020 with [facility].   Requesting annual flu vaccine today    Past Medical History:  Diagnosis Date  . Benign neoplasm of colon   . Diabetes mellitus without complication (HCC)    A0T currently a goal  . Essential hypertension, benign   . GERD (gastroesophageal reflux disease)   . Hyperlipidemia    Followed by PCP  . Hypertension   . Lumbago   . Migraine, unspecified, without mention of intractable migraine without mention of status migrainosus   . Osteoarthrosis, unspecified whether generalized or localized, unspecified site   . Palpitations    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  . Solitary pulmonary nodule 2012  . Urinary frequency     Past Surgical History:  Procedure Laterality Date  . ABDOMINAL HYSTERECTOMY    . CARDIAC CATHETERIZATION  11/15/1994   Patent coronary arteries and normal LV function  . CARDIAC CATHETERIZATION  11/03/2003   Normal coronary arteries  . COLONOSCOPY  2010   Dr.Bucinni, Normal   . COLONOSCOPY  2004   Dr.Bucinni  . EXERCISE STRESS TEST  11/12/1987   Positive  . SHOULDER SURGERY Right   . TRANSTHORACIC ECHOCARDIOGRAM  05/19/2010   EF >55%, normal-mild    Allergies  Allergen Reactions  . Azithromycin Hives  . Statins     Myalgias-high doses  . Tramadol Nausea Only  . Gemfibrozil Other (See Comments)    Leg cramps at night    Outpatient Encounter Medications as of 08/30/2018  Medication Sig  . acetaminophen (TYLENOL) 500 MG tablet Take 500 mg by mouth as needed for pain.  Marland Kitchen amLODipine (NORVASC) 5 MG tablet TAKE 1 TABLET BY MOUTH EVERYDAY AT BEDTIME  . aspirin EC 81 MG tablet Take 81 mg by mouth daily.  Marland Kitchen conjugated estrogens (PREMARIN) vaginal cream Pea sized amt PV 2 times per week for atrophic vaginitis  .  Cranberry 475 MG CAPS Take 1 capsule (475 mg total) by mouth 2 (two) times daily.  Marland Kitchen losartan (COZAAR) 25 MG tablet TAKE 2 TABLETS BY MOUTH EVERY DAY  . lovastatin (MEVACOR) 20 MG tablet TAKE ONE TABLET (20MG ) BY MOUTH AT BEDTIME  . metFORMIN (GLUCOPHAGE) 1000 MG tablet TAKE ONE TABLET (1000MG ) BY MOUTH TWICE DAILY WITH MEALS  . metoprolol tartrate (LOPRESSOR) 100 MG tablet Take 1 tablet (100 mg total) by mouth 2 (two) times daily.  . mirabegron ER (MYRBETRIQ) 25 MG TB24 tablet Take 1 tablet (25 mg total) by mouth daily.  . Multiple Vitamin (MULTIVITAMIN) tablet Take 1 tablet by mouth daily.   . Omega-3 1000 MG CAPS Take 1 g by mouth.  . Probiotic Product (ALIGN) 4 MG CAPS Take 4 mg by mouth once.  . vitamin B-12 (CYANOCOBALAMIN) 1000 MCG tablet Take 1,000 mcg by mouth daily.   No facility-administered encounter medications on file as of 08/30/2018.     Review of Systems:  Review of Systems  Constitutional: Negative for activity change, appetite change and fatigue.  HENT: Negative for dental problem, hearing loss and trouble swallowing.   Eyes: Negative for photophobia and visual disturbance.  Respiratory: Negative for cough, shortness of breath and wheezing.   Cardiovascular: Negative for chest pain and leg swelling.  Gastrointestinal: Positive for constipation. Negative for diarrhea and nausea.       GERD  Endocrine: Negative for polydipsia, polyphagia and polyuria.  Genitourinary: Positive for frequency and urgency. Negative for dysuria, hematuria and vaginal bleeding.  Musculoskeletal: Positive for arthralgias and back pain.  Skin: Negative.   Neurological: Negative for dizziness, tremors and headaches.  Psychiatric/Behavioral: Negative for dysphoric mood and sleep disturbance. The patient is not nervous/anxious.        Memory loss    Health Maintenance  Topic Date Due  . OPHTHALMOLOGY EXAM  06/28/2016  . FOOT EXAM  01/23/2018  . INFLUENZA VACCINE  08/11/2018  . HEMOGLOBIN A1C   08/15/2018  . TETANUS/TDAP  09/02/2024  . DEXA SCAN  Completed  . PNA vac Low Risk Adult  Completed    Physical Exam: Vitals:   08/30/18 0949  BP: 138/72  Pulse: 69  Temp: 97.9 F (36.6 C)  TempSrc: Oral  SpO2: 95%  Weight: 137 lb (62.1 kg)  Height: 5\' 5"  (1.651 m)   Body mass index is 22.8 kg/m. Physical Exam Vitals signs reviewed.  Constitutional:      General: She is not in acute distress.    Appearance: Normal appearance. She is normal weight. She is not ill-appearing.  HENT:     Head: Normocephalic.     Mouth/Throat:     Mouth: Mucous membranes are moist.  Cardiovascular:     Rate and Rhythm: Normal rate and regular rhythm.     Pulses: Normal pulses.  Dorsalis pedis pulses are 2+ on the right side and 2+ on the left side.     Heart sounds: Normal heart sounds. No murmur.  Pulmonary:     Effort: Pulmonary effort is normal. No respiratory distress.     Breath sounds: Normal breath sounds. No wheezing.  Abdominal:     General: Abdomen is flat. Bowel sounds are normal.     Palpations: Abdomen is soft.  Musculoskeletal:     Right lower leg: No edema.     Left lower leg: No edema.  Feet:     Right foot:     Skin integrity: Skin integrity normal.     Toenail Condition: Right toenails are normal.     Left foot:     Skin integrity: Skin integrity normal.     Toenail Condition: Left toenails are normal.     Comments: Sensation 10/10 on left and right foot.  Skin:    General: Skin is warm and dry.     Capillary Refill: Capillary refill takes less than 2 seconds.  Neurological:     General: No focal deficit present.     Mental Status: She is alert and oriented to person, place, and time.     Coordination: Coordination normal.     Gait: Gait normal.     Deep Tendon Reflexes: Reflexes normal.  Psychiatric:        Mood and Affect: Mood normal.        Behavior: Behavior normal.        Thought Content: Thought content normal.     Labs reviewed: Basic  Metabolic Panel: Recent Labs    10/10/17 1032 10/31/17 1517 02/14/18 0832  NA 139 138 141  K 4.2 3.8 4.3  CL 104 106 106  CO2 25 22 25   GLUCOSE 105* 97 105*  BUN 16 11 16   CREATININE 1.08* 1.04* 1.10*  CALCIUM 9.4 9.4 9.6   Liver Function Tests: No results for input(s): AST, ALT, ALKPHOS, BILITOT, PROT, ALBUMIN in the last 8760 hours. No results for input(s): LIPASE, AMYLASE in the last 8760 hours. No results for input(s): AMMONIA in the last 8760 hours. CBC: Recent Labs    10/31/17 1517 02/14/18 0832  WBC 6.8 6.5  NEUTROABS  --  3,829  HGB 13.6 13.4  HCT 41.4 40.6  MCV 93.0 91.2  PLT 335 319   Lipid Panel: Recent Labs    10/10/17 1032  CHOL 149  HDL 41*  LDLCALC 80  TRIG 185*  CHOLHDL 3.6   Lab Results  Component Value Date   HGBA1C 6.3 (H) 02/14/2018    Procedures since last visit: No results found.  Assessment/Plan 1. Controlled type 2 diabetes mellitus with stage 3 chronic kidney disease, without long-term current use of insulin ([place])  - patient admits to daily sweets in her diet - she is taking her metformin daily with food - eye exam was April 2020, note requested from [place] - last A1C was 6.3, will recheck today - hemoglobin A1C- today - complete metabloic panel with GFR- today - microalbumin- future  2. Mixed hyperlipidemia due to type 2 diabetes mellitus (HCC) - encourage walking and low fat diet - lipid panel- today  3. Gastroesophageal reflux with esophagitis - has not had severe episode in over a month - controlled with avoiding food triggers and daily omeprazole  4. Essential hypertension - asymptomatic, controlled - encourage limiting salt in diet - bp at goal of <150/90 - continue amlodipine 5 mg tablet  daily, losartan 50 mg tablet daily, ,metoprolol 100 mg BID   5. Senile osteopenia  - last DEXA scan 2017, will need one this year, discussed with patient - no history of fractures, but high risk for fracture - prolonged  use of PPI increases her risk   6. Overactive bladder - controlled with medication - continue Myrbetriq daily   7. Benign hypertension with CKD (Chronic kidney disease) Stage III (HCC) - bp at goal , 150/90, asymptomatic - continue amlodipine 5 mg tablet daily, losartan 50 mg tablet daily, metoprolol 100 mg tablet BID, ASA, and statin for cardiovascular prophylaxis - avoid nephrotoxic drugs like NSAIDS, and dose adjust medications when appropriate - complete blood count with differential/ platelets- today - complete metabolic panel with GFR- today  8. Need for influenza vaccination - administer flu vaccine High DOSE PF (Fluzone high dose)    Labs/tests ordered: complete blood panel with differential/platelets, complete metabolic panel with GFR, hemoglobin A1C, lipid panel, TSH  Next appt:  11/07/2018

## 2018-08-31 LAB — CBC WITH DIFFERENTIAL/PLATELET
Absolute Monocytes: 496 cells/uL (ref 200–950)
Basophils Absolute: 29 cells/uL (ref 0–200)
Basophils Relative: 0.5 %
Eosinophils Absolute: 120 cells/uL (ref 15–500)
Eosinophils Relative: 2.1 %
HCT: 39.8 % (ref 35.0–45.0)
Hemoglobin: 13.1 g/dL (ref 11.7–15.5)
Lymphs Abs: 1955 cells/uL (ref 850–3900)
MCH: 30 pg (ref 27.0–33.0)
MCHC: 32.9 g/dL (ref 32.0–36.0)
MCV: 91.3 fL (ref 80.0–100.0)
MPV: 10.3 fL (ref 7.5–12.5)
Monocytes Relative: 8.7 %
Neutro Abs: 3101 cells/uL (ref 1500–7800)
Neutrophils Relative %: 54.4 %
Platelets: 300 10*3/uL (ref 140–400)
RBC: 4.36 10*6/uL (ref 3.80–5.10)
RDW: 13.8 % (ref 11.0–15.0)
Total Lymphocyte: 34.3 %
WBC: 5.7 10*3/uL (ref 3.8–10.8)

## 2018-08-31 LAB — LIPID PANEL
Cholesterol: 120 mg/dL (ref <200)
HDL: 42 mg/dL — ABNORMAL LOW (ref >=50)
LDL Cholesterol (Calc): 58 mg/dL (calc)
Non-HDL Cholesterol (Calc): 78 mg/dL (calc) (ref <130)
Total CHOL/HDL Ratio: 2.9 (calc) (ref <5.0)
Triglycerides: 115 mg/dL (ref <150)

## 2018-08-31 LAB — COMPLETE METABOLIC PANEL WITH GFR
AG Ratio: 1.8 (calc) (ref 1.0–2.5)
ALT: 12 U/L (ref 6–29)
AST: 16 U/L (ref 10–35)
Albumin: 4.7 g/dL (ref 3.6–5.1)
Alkaline phosphatase (APISO): 64 U/L (ref 37–153)
BUN/Creatinine Ratio: 18 (calc) (ref 6–22)
BUN: 21 mg/dL (ref 7–25)
CO2: 27 mmol/L (ref 20–32)
Calcium: 9.7 mg/dL (ref 8.6–10.4)
Chloride: 105 mmol/L (ref 98–110)
Creat: 1.17 mg/dL — ABNORMAL HIGH (ref 0.60–0.93)
GFR, Est African American: 52 mL/min/{1.73_m2} — ABNORMAL LOW (ref >=60)
GFR, Est Non African American: 45 mL/min/{1.73_m2} — ABNORMAL LOW (ref >=60)
Globulin: 2.6 g/dL (calc) (ref 1.9–3.7)
Glucose, Bld: 102 mg/dL — ABNORMAL HIGH (ref 65–99)
Potassium: 4.2 mmol/L (ref 3.5–5.3)
Sodium: 141 mmol/L (ref 135–146)
Total Bilirubin: 0.4 mg/dL (ref 0.2–1.2)
Total Protein: 7.3 g/dL (ref 6.1–8.1)

## 2018-08-31 LAB — HEMOGLOBIN A1C
Hgb A1c MFr Bld: 6.3 % of total Hgb — ABNORMAL HIGH (ref <5.7)
Mean Plasma Glucose: 134 (calc)
eAG (mmol/L): 7.4 (calc)

## 2018-08-31 LAB — TSH: TSH: 0.98 mIU/L (ref 0.40–4.50)

## 2018-09-03 ENCOUNTER — Encounter: Payer: Self-pay | Admitting: Internal Medicine

## 2018-09-16 ENCOUNTER — Other Ambulatory Visit: Payer: Self-pay | Admitting: Internal Medicine

## 2018-09-27 LAB — HM DEXA SCAN

## 2018-09-27 LAB — HM MAMMOGRAPHY

## 2018-09-28 ENCOUNTER — Encounter: Payer: Self-pay | Admitting: *Deleted

## 2018-10-02 ENCOUNTER — Encounter: Payer: Self-pay | Admitting: *Deleted

## 2018-10-05 ENCOUNTER — Telehealth: Payer: Self-pay

## 2018-10-05 NOTE — Telephone Encounter (Signed)
Patient was called with results of bone density scan.    Osteopenia, bone mass less than normal, but not in osteoporosis range. Should take D3 2000 units daily, which has been recommended in the past.

## 2018-10-21 ENCOUNTER — Other Ambulatory Visit: Payer: Self-pay | Admitting: Internal Medicine

## 2018-10-25 ENCOUNTER — Other Ambulatory Visit: Payer: Self-pay | Admitting: Family

## 2018-10-25 NOTE — Telephone Encounter (Signed)
Patient requesting refill of expired medication last prescribed on 09/20/18. Routing to provider for review.

## 2018-11-06 ENCOUNTER — Other Ambulatory Visit: Payer: Self-pay | Admitting: Internal Medicine

## 2018-11-06 DIAGNOSIS — I10 Essential (primary) hypertension: Secondary | ICD-10-CM

## 2018-11-07 ENCOUNTER — Encounter: Payer: Self-pay | Admitting: Family

## 2018-11-07 ENCOUNTER — Ambulatory Visit: Payer: Self-pay

## 2018-12-01 ENCOUNTER — Ambulatory Visit
Admission: EM | Admit: 2018-12-01 | Discharge: 2018-12-01 | Disposition: A | Discharge status: Home / Self Care | Payer: Medicare Other | Attending: Emergency Medicine | Admitting: Emergency Medicine

## 2018-12-01 ENCOUNTER — Other Ambulatory Visit: Payer: Self-pay

## 2018-12-01 ENCOUNTER — Encounter: Payer: Self-pay | Admitting: Emergency Medicine

## 2018-12-01 DIAGNOSIS — R300 Dysuria: Secondary | ICD-10-CM | POA: Diagnosis present

## 2018-12-01 DIAGNOSIS — I10 Essential (primary) hypertension: Secondary | ICD-10-CM | POA: Diagnosis not present

## 2018-12-01 LAB — POCT URINALYSIS DIP (MANUAL ENTRY)
Bilirubin, UA: NEGATIVE
Glucose, UA: NEGATIVE mg/dL
Nitrite, UA: NEGATIVE
Protein Ur, POC: 100 mg/dL — AB
Spec Grav, UA: 1.025 (ref 1.010–1.025)
Urobilinogen, UA: 0.2 E.U./dL
pH, UA: 6 (ref 5.0–8.0)

## 2018-12-01 NOTE — ED Triage Notes (Signed)
Pt presents to [facility] for assessment of intermittent burning with urination x 2 days.  Pt also c/o urinary frequency and urgency.

## 2018-12-01 NOTE — ED Provider Notes (Signed)
[facility]    CSN: [id number] Arrival date & time: 12/01/18  1300      History   Chief Complaint Chief Complaint  Patient presents with  . Dysuria    HPI Mandy Perry is a 78 y.o. female with history of hypertension, diabetes presenting for intermittent burning with urination for the last 2 days.  Patient has also had some frequency/urgency, though states this is also not consistent.  Patient feels that she has had more frequent UTIs: Has not seen urology for this.  Chart review done during time of appointment by me showing PCP visit 8/10, for which she was treated for UTI with ciprofloxacin, the culture showed normal vaginal flora.  Patient states that she noticed improvement after 2 tabs of ciprofloxacin.  Patient denies fever, myalgias, arthralgias, abdominal or back pain.  No history of pyelonephritis, kidney stones.  Past Medical History:  Diagnosis Date  . Benign neoplasm of colon   . Diabetes mellitus without complication (HCC)    [id number] currently a goal  . Essential hypertension, benign   . GERD (gastroesophageal reflux disease)   . Hyperlipidemia    Followed by PCP  . Hypertension   . Lumbago   . Migraine, unspecified, without mention of intractable migraine without mention of status migrainosus   . Osteoarthrosis, unspecified whether generalized or localized, unspecified site   . Palpitations    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  . Solitary pulmonary nodule 2012  . Urinary frequency     Patient Active Problem List   Diagnosis Date Noted  . DDD (degenerative disc disease), lumbar 10/16/2017  . Change in bowel habits 10/16/2017  . B12 deficiency 06/08/2017  . Primary osteoarthritis of right knee 02/05/2017  . Overactive bladder 05/16/2016  . Senile osteopenia 05/16/2016  . Onychomycosis 05/16/2016  . 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 Heart palpitations 10/16/2013  . Gastroesophageal reflux disease with esophagitis 07/19/2013  . Controlled type 2  diabetes mellitus with stage 3 chronic kidney disease, without long-term current use of insulin ([place]) 07/19/2013  . CKD (chronic kidney disease) stage 3, GFR 30-59 ml/min (HCC) 07/19/2013  . Diabetic autonomic neuropathy associated with type 2 diabetes mellitus ([place]) 07/19/2013  . Mixed hyperlipidemia due to type 2 diabetes mellitus ([place]) 07/19/2013  . History of PSVT (paroxysmal supraventricular tachycardia) 10/11/2012  . Essential hypertension   . Diabetes mellitus without complication ([place])   . Hypercholesterolemia with hypertriglyceridemia     Past Surgical History:  Procedure Laterality Date  . ABDOMINAL HYSTERECTOMY    . CARDIAC CATHETERIZATION  11/15/1994   Patent coronary arteries and normal LV function  . CARDIAC CATHETERIZATION  11/03/2003   Normal coronary arteries  . COLONOSCOPY  2010   Dr.Bucinni, Normal   . COLONOSCOPY  2004   Dr.Bucinni  . EXERCISE STRESS TEST  11/12/1987   Positive  . SHOULDER SURGERY Right   . TRANSTHORACIC ECHOCARDIOGRAM  05/19/2010   EF >55%, normal-mild    OB History    Gravida  3   Para  3   Term      Preterm      AB      Living  3     SAB      TAB      Ectopic      Multiple      Live Births               Home Medications    Prior to Admission medications   Medication Sig Start Date  End Date Taking? Authorizing Provider  acetaminophen (TYLENOL) 500 MG tablet Take 500 mg by mouth as needed for pain.    [provider]  amLODipine (NORVASC) 5 MG tablet TAKE 1 TABLET BY MOUTH EVERYDAY AT BEDTIME 11/06/18   Reed, Tiffany L, DO  aspirin EC 81 MG tablet Take 81 mg by mouth daily.    [provider]  conjugated estrogens (PREMARIN) vaginal cream Pea sized amt PV 2 times per week for atrophic vaginitis 09/26/17   Gildardo Cranker, DO  [facility] CRANBERRY 500 MG CAPS TAKE 1 CAPSULE BY MOUTH TWICE A DAY 10/25/18   Reed, Tiffany L, DO  losartan (COZAAR) 25 MG tablet TAKE 2 TABLETS BY MOUTH EVERY DAY 08/06/18   Reed,  Tiffany L, DO  lovastatin (MEVACOR) 20 MG tablet TAKE 1 TABLET BY MOUTH AT BEDTIME 10/22/18   Reed, Tiffany L, DO  metFORMIN (GLUCOPHAGE) 1000 MG tablet TAKE ONE TABLET (1000MG ) BY MOUTH TWICE DAILY WITH MEALS 09/18/18   Reed, Tiffany L, DO  metoprolol tartrate (LOPRESSOR) 100 MG tablet Take 1 tablet (100 mg total) by mouth 2 (two) times daily. 07/12/18   Leonie Man, MD  mirabegron ER (MYRBETRIQ) 25 MG TB24 tablet Take 1 tablet (25 mg total) by mouth daily. 01/23/17   Reed, Tiffany L, DO  Multiple Vitamin (MULTIVITAMIN) tablet Take 1 tablet by mouth daily.     [provider]  Omega-3 1000 MG CAPS Take 1 g by mouth.    [provider]  Probiotic Product (ALIGN) 4 MG CAPS Take 4 mg by mouth once.    [provider]  vitamin B-12 (CYANOCOBALAMIN) 1000 MCG tablet Take 1,000 mcg by mouth daily.    [provider]    Family History Family History  Problem Relation Age of Onset  . CVA Mother   . Heart attack Father   . Cancer Father        Prostate cancer  . COPD Brother   . Diabetes Sister   . Colon cancer Sister   . Diabetes Sister   . Cancer Brother     Social History Social History   Tobacco Use  . Smoking status: Former Smoker    Packs/day: 0.50    Years: 20.00    Pack years: 10.00    Types: Cigarettes    Quit date: 01/10/1977    Years since quitting: 41.9  . Smokeless tobacco: Never Used  . Tobacco comment: Quit at age 47  Substance Use Topics  . Alcohol use: No  . Drug use: No     Allergies   Azithromycin, Statins, Tramadol, and Gemfibrozil   Review of Systems Review of Systems  Constitutional: Negative for fatigue and fever.  Respiratory: Negative for cough and shortness of breath.   Cardiovascular: Negative for chest pain and palpitations.  Gastrointestinal: Negative for constipation and diarrhea.  Genitourinary: Positive for dysuria, frequency and urgency. Negative for flank pain, hematuria, pelvic pain, vaginal bleeding,  vaginal discharge and vaginal pain.       Patient states symptoms are intermittent, without known aggravating, alleviating factors.     Physical Exam Triage Vital Signs ED Triage Vitals  Enc Vitals Group     BP      Pulse      Resp      Temp      Temp src      SpO2      Weight      Height      Head Circumference  Peak Flow      Pain Score      Pain Loc      Pain Edu?      Excl. in [place]?    No data found.  Updated Vital Signs BP (!) 165/70 (BP Location: Left Arm)   Pulse 71   Temp 99 F (37.2 C) (Oral)   Resp 16   SpO2 95%   Visual Acuity Right Eye Distance:   Left Eye Distance:   Bilateral Distance:    Right Eye Near:   Left Eye Near:    Bilateral Near:     Physical Exam Constitutional:      General: She is not in acute distress. HENT:     Head: Normocephalic and atraumatic.  Eyes:     General: No scleral icterus.    Pupils: Pupils are equal, round, and reactive to light.  Cardiovascular:     Rate and Rhythm: Normal rate.  Pulmonary:     Effort: Pulmonary effort is normal.  Abdominal:     General: Bowel sounds are normal.     Palpations: Abdomen is soft.     Tenderness: There is no abdominal tenderness. There is no right CVA tenderness, left CVA tenderness or guarding.  Skin:    Coloration: Skin is not jaundiced or pale.  Neurological:     Mental Status: She is alert and oriented to person, place, and time.      UC Treatments / Results  Labs (all labs ordered are listed, but only abnormal results are displayed) Labs Reviewed  POCT URINALYSIS DIP (MANUAL ENTRY) - Abnormal; Notable for the following components:      Result Value   Clarity, UA cloudy (*)    Ketones, POC UA trace (5) (*)    Blood, UA large (*)    Protein Ur, POC =100 (*)    Leukocytes, UA Small (1+) (*)    All other components within normal limits  URINE CULTURE    EKG   Radiology No results found.  Procedures Procedures (including critical care time)   Medications Ordered in UC Medications - No data to display  Initial Impression / Assessment and Plan / UC Course  I have reviewed the triage vital signs and the nursing notes.  Pertinent labs & imaging results that were available during my care of the patient were reviewed by me and considered in my medical decision making (see chart for details).      POCT urine dipstick done in office, reviewed by me: Showing small leukocytes, protein, blood, trace ketones.  Given patient's history of recurrent urinary symptoms despite negative urine cultures, this provider spoke with patient: Shared decision making resulting in waiting for culture prior to starting antibiotic as symptoms are intermittent/not persistent at this time.  Patient to monitor sugars closely, increase water intake in the interim.  Return precautions discussed, patient verbalized understanding and is agreeable to plan. Final Clinical Impressions(s) / UC Diagnoses   Final diagnoses:  Dysuria     Discharge Instructions     Culture pending: We will call you if you need to start antibiotic. Drink plenty of water, may get AZO over-the-counter. Return for worsening symptoms, abdominal pain, back pain, fever.    ED Prescriptions    None     PDMP not reviewed this encounter.   Hall[place]Potvin, [place], [place] 12/01/18 1419

## 2018-12-01 NOTE — Discharge Instructions (Signed)
Culture pending: We will call you if you need to start antibiotic. Drink plenty of water, may get AZO over-the-counter. Return for worsening symptoms, abdominal pain, back pain, fever.

## 2018-12-01 NOTE — ED Notes (Signed)
Patient able to ambulate independently  

## 2018-12-02 LAB — URINE CULTURE: Culture: NO GROWTH

## 2018-12-23 IMAGING — CR DG CHEST 2V
2 series · 2 of 2 positions shown · non-contrast
Comparison: 04/15/2011

CLINICAL DATA: Right site chest discomfort for 3 days, cough

EXAM:
CHEST  2 VIEW

[w chest pa]
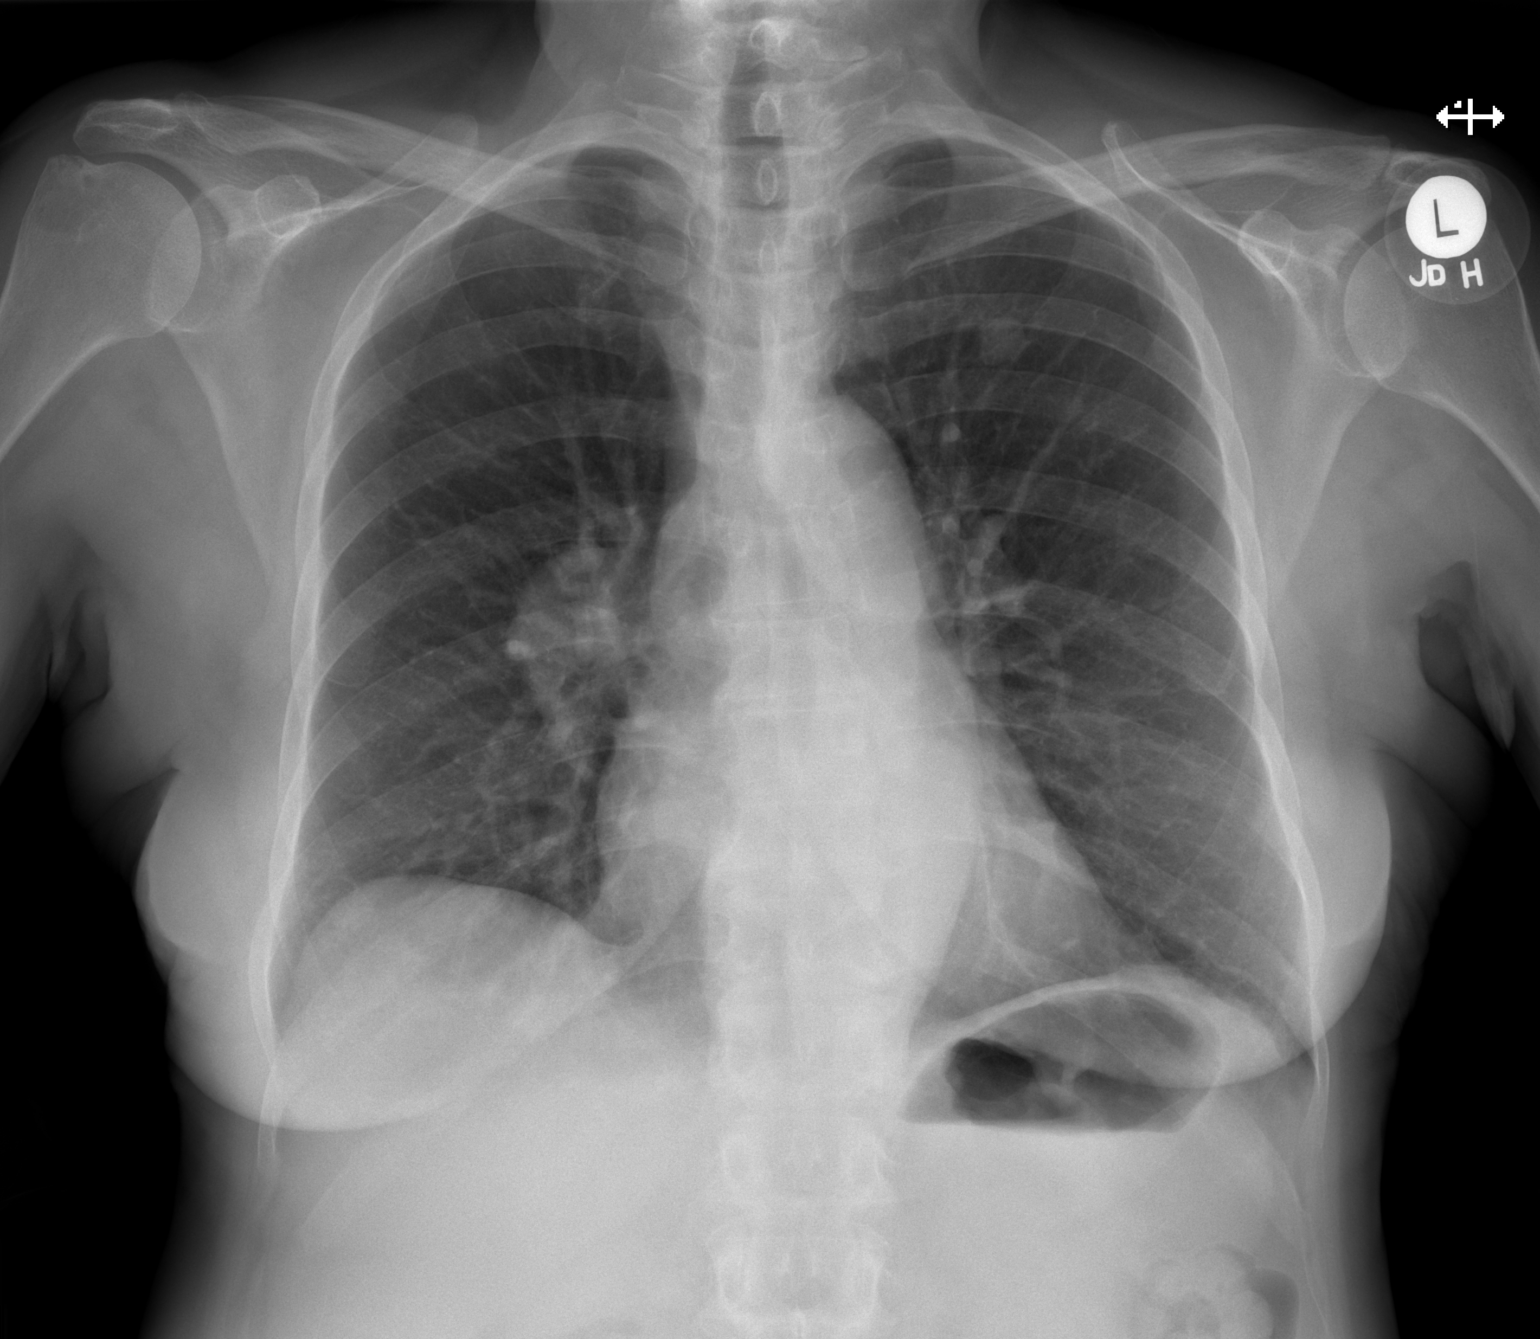

[w chest lat]
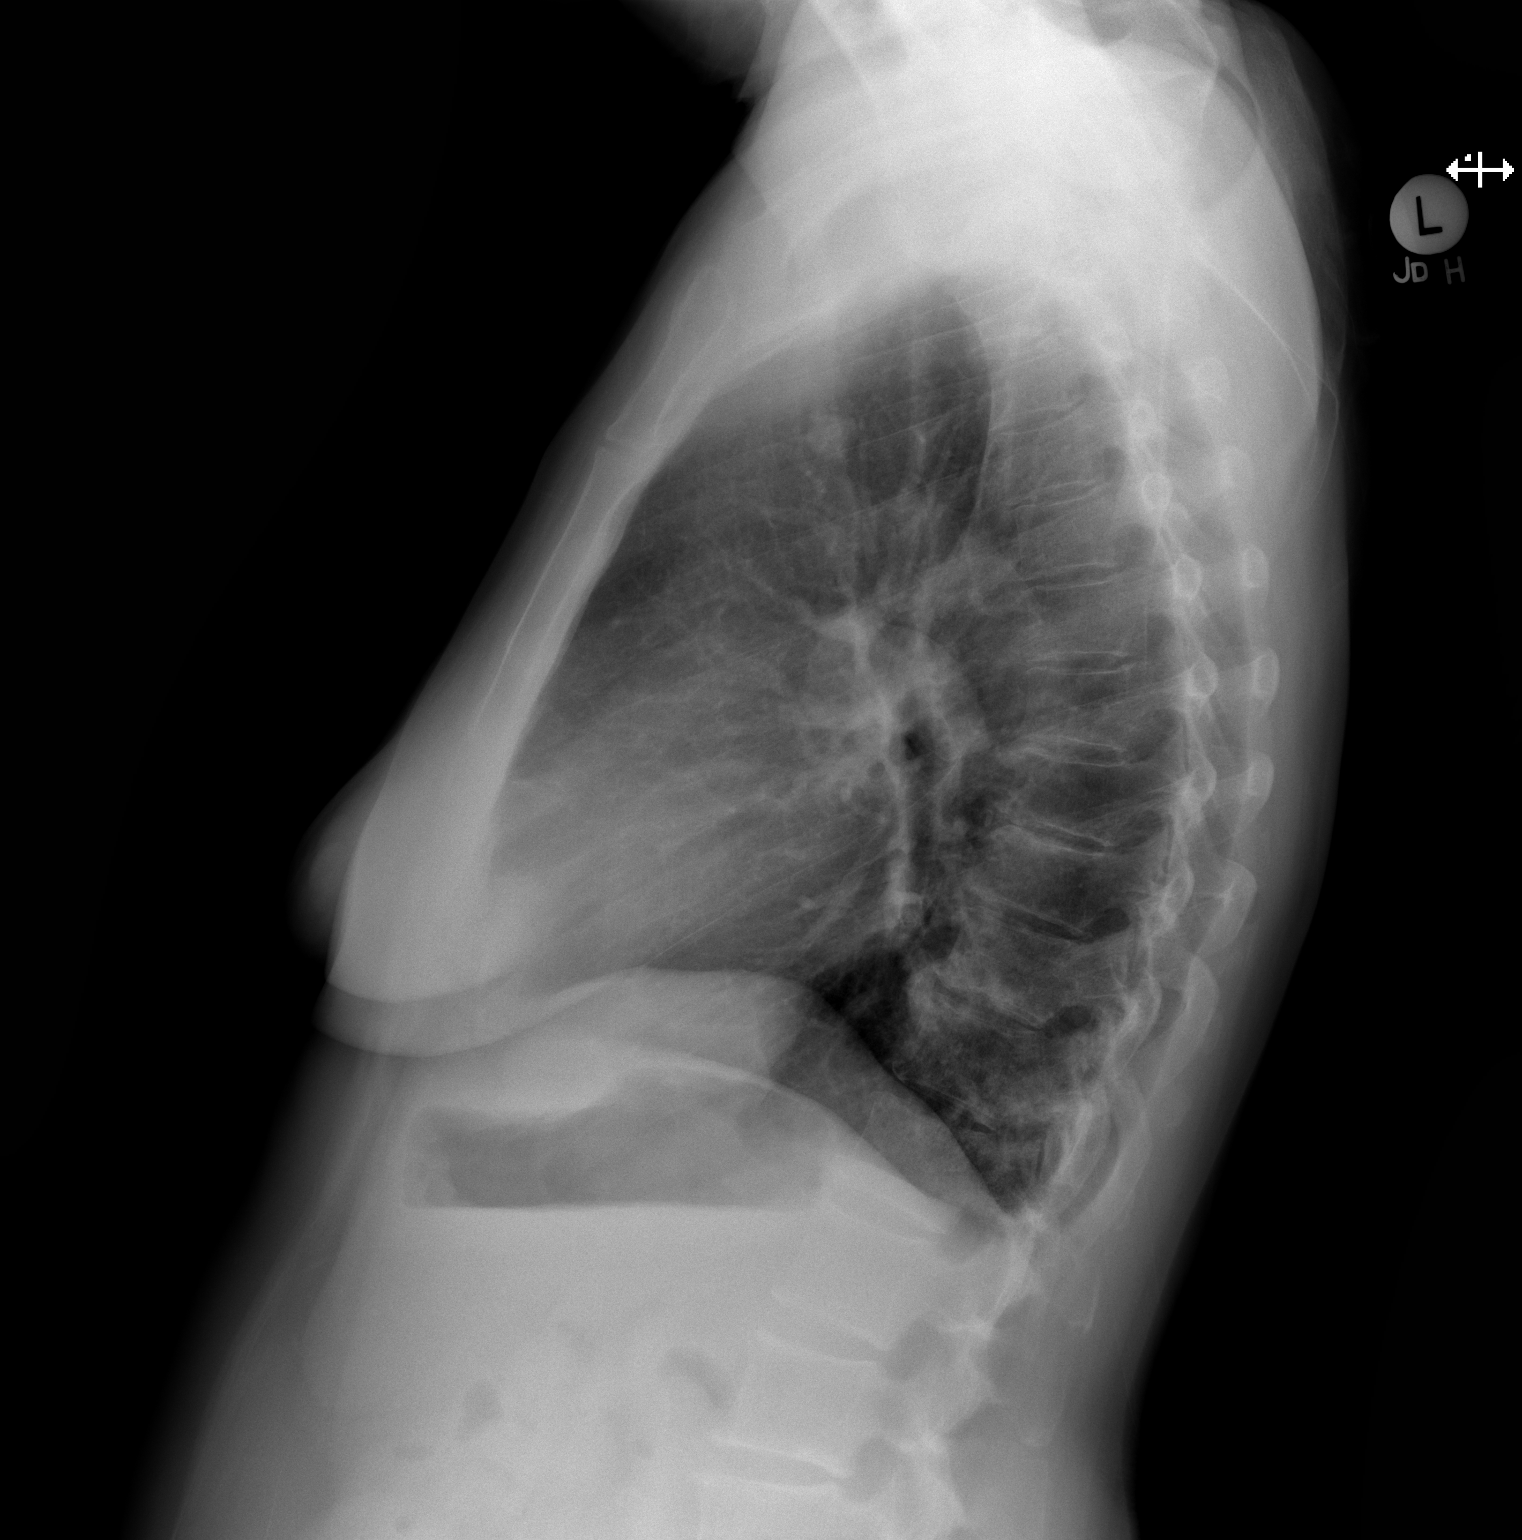

[2 of 2 positions shown; findings below may reference images not displayed]

FINDINGS: Cardiomediastinal silhouette is stable. No infiltrate or pulmonary
edema. Stable 11.4 mm nodule in left upper lobe. Stable degenerative
changes mid and lower thoracic spine. No pleural effusion.
IMPRESSION: No active cardiopulmonary disease. Stable 11.4 mm nodule in left
upper lobe.

## 2019-01-17 ENCOUNTER — Encounter: Payer: Medicare Other | Admitting: Obstetrics & Gynecology

## 2019-02-12 ENCOUNTER — Other Ambulatory Visit: Payer: Self-pay | Admitting: Internal Medicine

## 2019-02-21 ENCOUNTER — Other Ambulatory Visit: Payer: Self-pay | Admitting: Internal Medicine

## 2019-02-21 NOTE — Telephone Encounter (Signed)
rx sent to pharmacy by e-script  

## 2019-03-07 ENCOUNTER — Encounter: Payer: Medicare Other | Admitting: Internal Medicine

## 2019-03-30 ENCOUNTER — Other Ambulatory Visit: Payer: Self-pay | Admitting: Internal Medicine

## 2019-05-04 ENCOUNTER — Other Ambulatory Visit: Payer: Self-pay | Admitting: Internal Medicine

## 2019-06-11 LAB — HM DIABETES EYE EXAM

## 2019-07-01 ENCOUNTER — Other Ambulatory Visit: Payer: Self-pay

## 2019-07-01 ENCOUNTER — Ambulatory Visit
Admission: EM | Admit: 2019-07-01 | Discharge: 2019-07-01 | Disposition: A | Discharge status: Home / Self Care | Payer: Medicare HMO | Attending: Physician Assistant | Admitting: Physician Assistant

## 2019-07-01 DIAGNOSIS — N3090 Cystitis, unspecified without hematuria: Secondary | ICD-10-CM | POA: Diagnosis present

## 2019-07-01 LAB — POCT URINALYSIS DIP (MANUAL ENTRY)
Bilirubin, UA: NEGATIVE
Glucose, UA: NEGATIVE mg/dL
Ketones, POC UA: NEGATIVE mg/dL
Nitrite, UA: NEGATIVE
Protein Ur, POC: 30 mg/dL — AB
Spec Grav, UA: 1.015 (ref 1.010–1.025)
Urobilinogen, UA: 0.2 E.U./dL
pH, UA: 6.5 (ref 5.0–8.0)

## 2019-07-01 MED ORDER — CEPHALEXIN 500 MG PO CAPS: 500.0000 mg | ORAL_CAPSULE | Freq: Two times a day (BID) | ORAL | 0 refills | Status: DC

## 2019-07-01 NOTE — ED Provider Notes (Signed)
[facility]    CSN: [id number] Arrival date & time: 07/01/19  0851      History   Chief Complaint Chief Complaint  Patient presents with  . Urinary Tract Infection    HPI Mandy Perry is a 79 y.o. female.   79 year old female with history of DM, HLD, HTN, GERD comes in for 3 day history of urinary symptoms. Having dysuria, with some urgency. No obvious frequency, hematuria. Denies abdominal pain, nausea, vomiting. Denies fever, chills, flank/back pain. Occasional vaginal discharge, not currently. Has baseline vaginal itching, no changes. S/p hysterectomy. Fasting CBG 120s.      Past Medical History:  Diagnosis Date  . Benign neoplasm of colon   . Diabetes mellitus without complication (HCC)    Z0S currently a goal  . Essential hypertension, benign   . GERD (gastroesophageal reflux disease)   . Hyperlipidemia    Followed by PCP  . Hypertension   . Lumbago   . Migraine, unspecified, without mention of intractable migraine without mention of status migrainosus   . Osteoarthrosis, unspecified whether generalized or localized, unspecified site   . Palpitations    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  . Solitary pulmonary nodule 2012  . Urinary frequency     Patient Active Problem List   Diagnosis Date Noted  . DDD (degenerative disc disease), lumbar 10/16/2017  . Change in bowel habits 10/16/2017  . B12 deficiency 06/08/2017  . Primary osteoarthritis of right knee 02/05/2017  . Overactive bladder 05/16/2016  . Senile osteopenia 05/16/2016  . Onychomycosis 05/16/2016  . 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 Heart palpitations 10/16/2013  . Gastroesophageal reflux disease with esophagitis 07/19/2013  . Controlled type 2 diabetes mellitus with stage 3 chronic kidney disease, without long-term current use of insulin ([place]) 07/19/2013  . CKD (chronic kidney disease) stage 3, GFR 30-59 ml/min (HCC) 07/19/2013  . Diabetic autonomic neuropathy associated with type 2  diabetes mellitus ([place]) 07/19/2013  . Mixed hyperlipidemia due to type 2 diabetes mellitus ([place]) 07/19/2013  . History of PSVT (paroxysmal supraventricular tachycardia) 10/11/2012  . Essential hypertension   . Diabetes mellitus without complication ([place])   . Hypercholesterolemia with hypertriglyceridemia     Past Surgical History:  Procedure Laterality Date  . ABDOMINAL HYSTERECTOMY    . CARDIAC CATHETERIZATION  11/15/1994   Patent coronary arteries and normal LV function  . CARDIAC CATHETERIZATION  11/03/2003   Normal coronary arteries  . COLONOSCOPY  2010   Dr.Bucinni, Normal   . COLONOSCOPY  2004   Dr.Bucinni  . EXERCISE STRESS TEST  11/12/1987   Positive  . SHOULDER SURGERY Right   . TRANSTHORACIC ECHOCARDIOGRAM  05/19/2010   EF >55%, normal-mild    OB History    Gravida  3   Para  3   Term      Preterm      AB      Living  3     SAB      TAB      Ectopic      Multiple      Live Births               Home Medications    Prior to Admission medications   Medication Sig Start Date End Date Taking? Authorizing Provider  acetaminophen (TYLENOL) 500 MG tablet Take 500 mg by mouth as needed for pain.    [provider]  amLODipine (NORVASC) 5 MG tablet TAKE 1 TABLET BY MOUTH EVERYDAY AT BEDTIME 11/06/18  Reed, Tiffany L, DO  aspirin EC 81 MG tablet Take 81 mg by mouth daily.    [provider]  cephALEXin (KEFLEX) 500 MG capsule Take 1 capsule (500 mg total) by mouth 2 (two) times daily. 07/01/19   Tasia Catchings, Sarahlynn Cisnero V, PA-C  conjugated estrogens (PREMARIN) vaginal cream Pea sized amt PV 2 times per week for atrophic vaginitis 09/26/17   Gildardo Cranker, DO  [facility] CRANBERRY 500 MG CAPS TAKE 1 CAPSULE BY MOUTH TWICE A DAY 10/25/18   Reed, Tiffany L, DO  losartan (COZAAR) 25 MG tablet TAKE 2 TABLETS BY MOUTH EVERY DAY 02/21/19   Reed, Tiffany L, DO  lovastatin (MEVACOR) 20 MG tablet TAKE 1 TABLET BY MOUTH AT BEDTIME 05/06/19   Reed, Tiffany L, DO  metFORMIN  (GLUCOPHAGE) 1000 MG tablet TAKE ONE TABLET (1000MG ) BY MOUTH TWICE DAILY WITH MEALS 04/01/19   Reed, Tiffany L, DO  metoprolol tartrate (LOPRESSOR) 100 MG tablet Take 1 tablet (100 mg total) by mouth 2 (two) times daily. 07/12/18   Leonie Man, MD  mirabegron ER (MYRBETRIQ) 25 MG TB24 tablet Take 1 tablet (25 mg total) by mouth daily. 01/23/17   Reed, Tiffany L, DO  Multiple Vitamin (MULTIVITAMIN) tablet Take 1 tablet by mouth daily.     [provider]  Omega-3 1000 MG CAPS Take 1 g by mouth.    [provider]  Probiotic Product (ALIGN) 4 MG CAPS Take 4 mg by mouth once.    [provider]  vitamin B-12 (CYANOCOBALAMIN) 1000 MCG tablet Take 1,000 mcg by mouth daily.    [provider]    Family History Family History  Problem Relation Age of Onset  . CVA Mother   . Heart attack Father   . Cancer Father        Prostate cancer  . COPD Brother   . Diabetes Sister   . Colon cancer Sister   . Diabetes Sister   . Cancer Brother     Social History Social History   Tobacco Use  . Smoking status: Former Smoker    Packs/day: 0.50    Years: 20.00    Pack years: 10.00    Types: Cigarettes    Quit date: 01/10/1977    Years since quitting: 42.4  . Smokeless tobacco: Never Used  . Tobacco comment: Quit at age 13  Vaping Use  . Vaping Use: Never used  Substance Use Topics  . Alcohol use: No  . Drug use: No     Allergies   Azithromycin, Statins, Tramadol, and Gemfibrozil   Review of Systems Review of Systems  Reason unable to perform ROS: See HPI as above.     Physical Exam Triage Vital Signs ED Triage Vitals  Enc Vitals Group     BP 07/01/19 0909 (!) 153/76     Pulse Rate 07/01/19 0909 61     Resp 07/01/19 0909 18     Temp 07/01/19 0909 98.9 F (37.2 C)     Temp Source 07/01/19 0909 Oral     SpO2 07/01/19 0909 95 %     Weight --      Height --      Head Circumference --      Peak Flow --      Pain Score 07/01/19 0910 0      Pain Loc --      Pain Edu? --      Excl. in [place]? --    No data found.  Updated Vital Signs BP (!) 153/76 (BP Location: Left Arm)   Pulse 61   Temp 98.9 F (37.2 C) (Oral)   Resp 18   SpO2 95%   Physical Exam Constitutional:      General: She is not in acute distress.    Appearance: She is well-developed. She is not ill-appearing, toxic-appearing or diaphoretic.  HENT:     Head: Normocephalic and atraumatic.  Eyes:     Conjunctiva/sclera: Conjunctivae normal.     Pupils: Pupils are equal, round, and reactive to light.  Cardiovascular:     Rate and Rhythm: Normal rate and regular rhythm.  Pulmonary:     Effort: Pulmonary effort is normal. No respiratory distress.     Comments: LCTAB Abdominal:     General: Bowel sounds are normal.     Palpations: Abdomen is soft.     Tenderness: There is no abdominal tenderness. There is no right CVA tenderness, left CVA tenderness, guarding or rebound.  Musculoskeletal:     Cervical back: Normal range of motion and neck supple.  Skin:    General: Skin is warm and dry.  Neurological:     Mental Status: She is alert and oriented to person, place, and time.  Psychiatric:        Behavior: Behavior normal.        Judgment: Judgment normal.      UC Treatments / Results  Labs (all labs ordered are listed, but only abnormal results are displayed) Labs Reviewed  POCT URINALYSIS DIP (MANUAL ENTRY) - Abnormal; Notable for the following components:      Result Value   Clarity, UA cloudy (*)    Blood, UA moderate (*)    Protein Ur, POC =30 (*)    Leukocytes, UA Large (3+) (*)    All other components within normal limits  URINE CULTURE    EKG   Radiology No results found.  Procedures Procedures (including critical care time)  Medications Ordered in UC Medications - No data to display  Initial Impression / Assessment and Plan / UC Course  I have reviewed the triage vital signs and the nursing notes.  Pertinent labs & imaging  results that were available during my care of the patient were reviewed by me and considered in my medical decision making (see chart for details).    Urine dipstick with moderate blood, 3+ leuks. Patient with similar symptoms 11/2018, with large blood, small leuks and negative culture. Discussed awaiting urine culture prior to treatment vs empiric treatment, patient would like to be cover for UTI at this time. Keflex as directed. Push fluids. Return precautions given. Otherwise, patient to follow up with PCP in 2-4 weeks for recheck urine to ensure blood in urine has resolved.   Final Clinical Impressions(s) / UC Diagnoses   Final diagnoses:  Cystitis    ED Prescriptions    Medication Sig Dispense Auth. Provider   cephALEXin (KEFLEX) 500 MG capsule Take 1 capsule (500 mg total) by mouth 2 (two) times daily. 14 capsule Ok Edwards, PA-C     PDMP not reviewed this encounter.   Ok Edwards, PA-C 07/01/19 [phone]

## 2019-07-01 NOTE — ED Triage Notes (Signed)
Pt c/o burning on urination x3 days with some urgency.

## 2019-07-01 NOTE — Discharge Instructions (Signed)
Your urine was positive for an urinary tract infection. Start keflex as directed. Keep hydrated, urine should be clear to pale yellow in color. Monitor for any worsening of symptoms, fever, worsening abdominal pain, nausea/vomiting, flank pain, follow up for reevaluation. Otherwise, recheck urine with PCP in 2-4 weeks to ensure blood has resolved.

## 2019-07-03 LAB — URINE CULTURE: Culture: >=100000 — AB

## 2019-07-25 ENCOUNTER — Other Ambulatory Visit: Payer: Self-pay

## 2019-07-25 ENCOUNTER — Ambulatory Visit (INDEPENDENT_AMBULATORY_CARE_PROVIDER_SITE_OTHER): Payer: Medicare HMO | Admitting: Cardiology

## 2019-07-25 ENCOUNTER — Encounter: Payer: Self-pay | Admitting: Cardiology

## 2019-07-25 VITALS — BP 130/72 | HR 59 | Temp 97.0°F | Ht 65.5 in | Wt 136.0 lb

## 2019-07-25 DIAGNOSIS — R002 Palpitations: Secondary | ICD-10-CM

## 2019-07-25 DIAGNOSIS — I471 Supraventricular tachycardia: Secondary | ICD-10-CM | POA: Diagnosis not present

## 2019-07-25 DIAGNOSIS — E1169 Type 2 diabetes mellitus with other specified complication: Secondary | ICD-10-CM | POA: Diagnosis not present

## 2019-07-25 DIAGNOSIS — I10 Essential (primary) hypertension: Secondary | ICD-10-CM | POA: Diagnosis not present

## 2019-07-25 DIAGNOSIS — E782 Mixed hyperlipidemia: Secondary | ICD-10-CM

## 2019-07-25 NOTE — Progress Notes (Signed)
Primary Care Provider: [facility] Cardiologist: Glenetta Hew, MD Electrophysiologist: None  Clinic Note: Chief Complaint  Patient presents with  . Follow-up    Doing well  . Palpitations    History of PSVT; no tachycardia spells since last visit.    HPI:    Mandy Perry is a 79 y.o. female with a PMH notable for history of PSVT and hypertension who presents today for annual follow-up.  Mandy Perry was last seen on July 12, 2018 for 1 month follow-up after being seen by Jory Sims, DNP for an SVT episode.-I did her metoprolol dose was supposed with an increased 100 mg in the morning and 50 mg in the evening, but she was taking it 100 mg twice daily. --> Had more irregular heartbeats over the last few months and felt better with a higher dose of beta-blocker.  Regular heartbeats spells were short less than 75minute described as a irregular sensation/fluttering.  Usually when she lies down at night not associate with stressors exertion.  Officially increased metoprolol to 100 mg twice daily as needed 50 mg for additional palpitations.  Recent Hospitalizations:   ER visit July 01, 2019 with cystitis  Reviewed  CV studies:    The following studies were reviewed today: (if available, images/films reviewed: From Epic Chart or Care Everywhere) . None:   Interval History:   Mandy Perry returns here today for annual follow-up doing pretty well.  She said that she really has not had any more the tachycardia spells in the last year.  Every now and then she will feel a quick burst but nothing more than a few seconds now.  None of the more prominent symptoms lasting minutes.  Where she does note is that she sometimes is dizzy when she wakes up to urinate.  She does not have true chest pain but does occasionally have heartburn. She has been walking more and is trying to get her weight down.  She says that since he started walking more the palpitations improved.   Cardiovascular Review of Symptoms (Summary): no chest pain or dyspnea on exertion positive for - Rare fleeting skipped beats but nothing prolonged; nocturnal orthostatic dizziness. negative for - chest pain, edema, orthopnea, paroxysmal nocturnal dyspnea, rapid heart rate, shortness of breath or Although she is dizzy, no lightheadedness or syncope/near syncope, TIA/amaurosis fugax, claudication  The patient does not have symptoms concerning for COVID-19 infection (fever, chills, cough, or new shortness of breath).  The patient is practicing social distancing & Masking.   Covid vaccine in January February  REVIEWED OF SYSTEMS   Review of Systems  Constitutional: Positive for weight loss (Intentional). Negative for malaise/fatigue.  HENT: Negative for nosebleeds.   Respiratory: Negative for shortness of breath.   Gastrointestinal: Negative for blood in stool and melena.  Genitourinary: Negative for hematuria.  Musculoskeletal: Positive for joint pain. Negative for falls.  Neurological: Positive for dizziness (Per HPI). Negative for focal weakness.  Endo/Heme/Allergies: Does not bruise/bleed easily.  Psychiatric/Behavioral: Negative.    I have reviewed and (if needed) personally updated the patient's problem list, medications, allergies, past medical and surgical history, social and family history.   PAST MEDICAL HISTORY   Past Medical History:  Diagnosis Date  . Benign neoplasm of colon   . Diabetes mellitus without complication (HCC)    J8S currently a goal  . Essential hypertension, benign   . GERD (gastroesophageal reflux disease)   . Hyperlipidemia    Followed by PCP  .  Hypertension   . Lumbago   . Migraine, unspecified, without mention of intractable migraine without mention of status migrainosus   . Osteoarthrosis, unspecified whether generalized or localized, unspecified site   . Palpitations    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  . Solitary pulmonary nodule 2012  .  Urinary frequency     PAST SURGICAL HISTORY   Past Surgical History:  Procedure Laterality Date  . ABDOMINAL HYSTERECTOMY    . CARDIAC CATHETERIZATION  11/15/1994   Patent coronary arteries and normal LV function  . CARDIAC CATHETERIZATION  11/03/2003   Normal coronary arteries  . COLONOSCOPY  2010   Dr.Bucinni, Normal   . COLONOSCOPY  2004   Dr.Bucinni  . EXERCISE STRESS TEST  11/12/1987   Positive  . SHOULDER SURGERY Right   . TRANSTHORACIC ECHOCARDIOGRAM  05/19/2010   EF >55%, normal-mild    MEDICATIONS/ALLERGIES   Current Meds  Medication Sig  . acetaminophen (TYLENOL) 500 MG tablet Take 500 mg by mouth as needed for pain.  Marland Kitchen amLODipine (NORVASC) 5 MG tablet TAKE 1 TABLET BY MOUTH EVERYDAY AT BEDTIME  . aspirin EC 81 MG tablet Take 81 mg by mouth daily.  . cetirizine (ZYRTEC) 10 MG tablet Take by mouth at bedtime.  . 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 [employer] CRANBERRY 500 MG CAPS TAKE 1 CAPSULE BY MOUTH TWICE A DAY  . famotidine (PEPCID) 20 MG tablet Take 20 mg by mouth 2 (two) times daily.  Marland Kitchen gabapentin (NEURONTIN) 100 MG capsule Take 100 mg by mouth daily.  Marland Kitchen losartan (COZAAR) 50 MG tablet Take 50 mg by mouth 2 (two) times daily.  Marland Kitchen lovastatin (MEVACOR) 20 MG tablet TAKE 1 TABLET BY MOUTH AT BEDTIME (Patient taking differently: Take 40 mg by mouth at bedtime. )  . metFORMIN (GLUCOPHAGE) 1000 MG tablet TAKE ONE TABLET (1000MG ) BY MOUTH TWICE DAILY WITH MEALS  . metoprolol tartrate (LOPRESSOR) 100 MG tablet Take 1 tablet (100 mg total) by mouth 2 (two) times daily.  . mirabegron ER (MYRBETRIQ) 25 MG TB24 tablet Take 1 tablet (25 mg total) by mouth daily.  . Multiple Vitamin (MULTIVITAMIN) tablet Take 1 tablet by mouth daily.   . Omega-3 1000 MG CAPS Take 1 g by mouth.  . Probiotic Product (ALIGN) 4 MG CAPS Take 4 mg by mouth once.  Mathis Bud RX 60-1.25 MG CAPS Take 1 capsule by mouth daily.  . solifenacin  (VESICARE) 5 MG tablet Take 5 mg by mouth daily.  . vitamin B-12 (CYANOCOBALAMIN) 1000 MCG tablet Take 1,000 mcg by mouth daily.    Allergies  Allergen Reactions  . Azithromycin Hives  . Statins     Myalgias-high doses  . Tramadol Nausea Only  . Gemfibrozil Other (See Comments)    Leg cramps at night    SOCIAL HISTORY/FAMILY HISTORY   Reviewed in Epic:  Pertinent findings: Covid Vaccine Jan & Feb  OBJCTIVE -PE, EKG, labs   Wt Readings from Last 3 Encounters:  07/25/19 136 lb (61.7 kg)  08/30/18 137 lb (62.1 kg)  08/20/18 140 lb 3.2 oz (63.6 kg)    Physical Exam: BP 130/72   Pulse (!) 59   Temp (!) 97 F (36.1 C)   Ht 5' 5.5" (1.664 m)   Wt 136 lb (61.7 kg)   SpO2 97%   BMI 22.29 kg/m  Physical Exam Constitutional:      General: She is not in acute distress.  Appearance: Normal appearance. She is normal weight. She is not ill-appearing.  HENT:     Head: Normocephalic and atraumatic.  Cardiovascular:     Rate and Rhythm: Normal rate and regular rhythm.     Pulses: Normal pulses.     Heart sounds: Murmur (/6 SEM at RUSB) heard.  No friction rub. No gallop.   Pulmonary:     Effort: Pulmonary effort is normal. No respiratory distress.     Breath sounds: Normal breath sounds.  Chest:     Chest wall: No tenderness.  Musculoskeletal:        General: Swelling (Trivial) present. Normal range of motion.  Neurological:     General: No focal deficit present.     Mental Status: She is alert and oriented to person, place, and time.  Psychiatric:        Mood and Affect: Mood normal.        Behavior: Behavior normal.        Thought Content: Thought content normal.        Judgment: Judgment normal.      Adult ECG Report  Rate: 59;  Rhythm: sinus bradycardia and Left atrial abnormality, LVH with repol changes.--Nonspecific ST-T wave changes.;   Narrative Interpretation: Relatively stable  Recent Labs: No new labs Lab Results  Component Value Date   CHOL 120  08/30/2018   HDL 42 (L) 08/30/2018   LDLCALC 58 08/30/2018   TRIG 115 08/30/2018   CHOLHDL 2.9 08/30/2018   Lab Results  Component Value Date   CREATININE 1.17 (H) 08/30/2018   BUN 21 08/30/2018   NA 141 08/30/2018   K 4.2 08/30/2018   CL 105 08/30/2018   CO2 27 08/30/2018   Lab Results  Component Value Date   TSH 0.98 08/30/2018    ASSESSMENT/PLAN    Problem List Items Addressed This Visit    Essential hypertension - Primary (Chronic)    Blood pressure seems pretty well controlled on metoprolol and amlodipine along with losartan.  A little bit worried about her getting dizzy at night we may want to have her take all of her losartan in the morning, but will follow.      Relevant Medications   losartan (COZAAR) 50 MG tablet   Other Relevant Orders   EKG 12-Lead (Completed)   Heart palpitations (Chronic)    Probably having some PACs and PVCs, but relatively rare now on higher dose beta-blocker.  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  Lipids now look pretty well controlled as of August of last year. Continue current dose for now.      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  Well-controlled on current dose of beta-blocker. We reiterated vagal maneuvers.      Relevant Medications   losartan (COZAAR) 50 MG tablet   Other Relevant Orders   EKG 12-Lead (Completed)       COVID-19 Education: The signs and symptoms of COVID-19 were discussed with the patient and how to seek care for testing (follow up with PCP or arrange E-visit).   The importance of social distancing and COVID-19 vaccination was discussed today.  I spent a  total of 16 minutes with  the patient. >  50% of the time was spent in direct patient consultation.  Additional time spent with chart review  / charting (studies, outside notes, etc): 6 Total Time: 22 min   Current medicines are reviewed at length with the patient today.  (+/- concerns) none  Notice: This dictation was prepared with Dragon dictation along with smaller phrase technology. Any transcriptional errors that result from this process are unintentional and may not be corrected upon review.  Patient Instructions / Medication Changes & Studies & Tests Ordered   Patient Instructions  Medication Instructions:  No changes  *If you need a refill on your cardiac medications before your next appointment, please call your pharmacy*   Lab Work: Not needed   Testing/Procedures: Not needed   Follow-Up: At [facility], you and your health needs are our priority.  As part of our continuing mission to provide you with exceptional heart care, we have created designated Provider Care Teams.  These Care Teams include your primary Cardiologist (physician) and Advanced Practice Providers (APPs -  Physician Assistants and Nurse Practitioners) who all work together to provide you with the care you need, when you need it.  We recommend signing up for the patient portal called "MyChart".  Sign up information is provided on this After Visit Summary.  MyChart is used to connect with patients for Virtual Visits (Telemedicine).  Patients are able to view lab/test results, encounter notes, upcoming appointments, etc.  Non-urgent messages can be sent to your provider as well.   To learn more about what you can do with MyChart, go to [email or web address].    Your next appointment:   12 month(s)  The format for your next appointment:   In Person  Provider:   Glenetta Hew, MD   Other Instructions  pay close attention when you get up at night to go to the bathroom -( dizziness)   sitting for a few minutes before  standing  Prior  And after using the bathroom     Studies Ordered:   Orders Placed This Encounter  Procedures  . EKG 12-Lead     Glenetta Hew, M.D., M.S. Interventional Cardiologist   Pager # [phone] Phone # [phone] [address]. [place], [place] [address]   Thank you for choosing Heart[facility] at [facility]!!

## 2019-07-25 NOTE — Patient Instructions (Signed)
Medication Instructions:  No changes  *If you need a refill on your cardiac medications before your next appointment, please call your pharmacy*   Lab Work: Not needed   Testing/Procedures: Not needed   Follow-Up: At [facility], you and your health needs are our priority.  As part of our continuing mission to provide you with exceptional heart care, we have created designated Provider Care Teams.  These Care Teams include your primary Cardiologist (physician) and Advanced Practice Providers (APPs -  Physician Assistants and Nurse Practitioners) who all work together to provide you with the care you need, when you need it.  We recommend signing up for the patient portal called "MyChart".  Sign up information is provided on this After Visit Summary.  MyChart is used to connect with patients for Virtual Visits (Telemedicine).  Patients are able to view lab/test results, encounter notes, upcoming appointments, etc.  Non-urgent messages can be sent to your provider as well.   To learn more about what you can do with MyChart, go to [email or web address].    Your next appointment:   12 month(s)  The format for your next appointment:   In Person  Provider:   Glenetta Hew, MD   Other Instructions  pay close attention when you get up at night to go to the bathroom -( dizziness)   sitting for a few minutes before standing  Prior  And after using the bathroom

## 2019-08-03 ENCOUNTER — Encounter: Payer: Self-pay | Admitting: Cardiology

## 2019-08-03 DIAGNOSIS — I471 Supraventricular tachycardia: Secondary | ICD-10-CM | POA: Insufficient documentation

## 2019-08-03 NOTE — Assessment & Plan Note (Signed)
Blood pressure seems pretty well controlled on metoprolol and amlodipine along with losartan.  A little bit worried about her getting dizzy at night we may want to have her take all of her losartan in the morning, but will follow.

## 2019-08-03 NOTE — Assessment & Plan Note (Signed)
No prolonged palpitation spells in over a year now.  Well-controlled on current dose of beta-blocker. We reiterated vagal maneuvers.

## 2019-08-03 NOTE — Assessment & Plan Note (Signed)
Probably having some PACs and PVCs, but relatively rare now on higher dose beta-blocker.  Thankfully, her energy level stay stable

## 2019-08-03 NOTE — Assessment & Plan Note (Deleted)
No prolonged palpitation spells in over a year now.  Well-controlled on current dose of beta-blocker. We reiterated vagal maneuvers.

## 2019-08-03 NOTE — Assessment & Plan Note (Signed)
She was switched to 40 mg lovastatin based on previous labs.  Lipids now look pretty well controlled as of August of last year. Continue current dose for now.

## 2019-08-21 ENCOUNTER — Other Ambulatory Visit: Payer: Self-pay | Admitting: Internal Medicine

## 2019-08-21 NOTE — Telephone Encounter (Signed)
Patient needs an appointment last appointment was 08/20. I call and left a VM for her to come in and see one of our providers.

## 2019-08-22 ENCOUNTER — Other Ambulatory Visit: Payer: Self-pay | Admitting: Cardiology

## 2019-08-22 ENCOUNTER — Other Ambulatory Visit: Payer: Self-pay | Admitting: Internal Medicine

## 2019-09-05 ENCOUNTER — Other Ambulatory Visit: Payer: Self-pay | Admitting: Internal Medicine

## 2019-09-26 ENCOUNTER — Other Ambulatory Visit: Payer: Self-pay | Admitting: Internal Medicine

## 2019-10-05 ENCOUNTER — Ambulatory Visit
Admission: EM | Admit: 2019-10-05 | Discharge: 2019-10-05 | Disposition: A | Discharge status: Home / Self Care | Payer: Medicare HMO | Attending: Family Medicine | Admitting: Family Medicine

## 2019-10-05 ENCOUNTER — Other Ambulatory Visit: Payer: Self-pay

## 2019-10-05 DIAGNOSIS — Z1152 Encounter for screening for COVID-19: Secondary | ICD-10-CM | POA: Diagnosis not present

## 2019-10-05 DIAGNOSIS — J019 Acute sinusitis, unspecified: Secondary | ICD-10-CM

## 2019-10-05 DIAGNOSIS — I10 Essential (primary) hypertension: Secondary | ICD-10-CM

## 2019-10-05 MED ORDER — BENZONATATE 100 MG PO CAPS: 100.0000 mg | ORAL_CAPSULE | Freq: Three times a day (TID) | ORAL | 0 refills | Status: DC | PRN

## 2019-10-05 MED ORDER — TIZANIDINE HCL 2 MG PO CAPS: 2.0000 mg | ORAL_CAPSULE | Freq: Every evening | ORAL | 0 refills | Status: AC | PRN

## 2019-10-05 MED ORDER — CETIRIZINE HCL 10 MG PO TABS: 5.0000 mg | ORAL_TABLET | Freq: Every day | ORAL | 0 refills | Status: AC

## 2019-10-05 MED ORDER — IPRATROPIUM BROMIDE 0.03 % NA SOLN: 2.0000 | Freq: Two times a day (BID) | NASAL | 0 refills | Status: AC

## 2019-10-05 NOTE — ED Triage Notes (Signed)
Pt here for nasal congestion and cough with HA x 2 days; pt noted hypertensive sts hx of same and did take her meds this am

## 2019-10-05 NOTE — Discharge Instructions (Addendum)
Follow-up with your primary care provider immediately regarding your elevation in blood pressure.  Blood pressure readings are critically high.  Check your blood pressure before you go to bed tonight as well if readings are constantly greater than 160/100 I would like for you to contact your primary care doctor after hours phone call number.  This could be related to your current course of illness.  If any shortness of breath or severe head pain develops go immediately to the emergency department.

## 2019-10-05 NOTE — ED Provider Notes (Signed)
[facility]    CSN: [id number] Arrival date & time: 10/05/19  1133      History   Chief Complaint Chief Complaint  Patient presents with  . Nasal Congestion  . Headache  . Cough    HPI Mandy Perry is a 79 y.o. female.   HPI   Patient presents with symptoms of cough, congestion, headache and nasal congestion x3 days.  She is currently being treated for acute UTI by her primary care provider with ciprofloxacin.  Nasal congestion symptoms started the subsequent day.  She has had headache for 1 day which she describes as pressure in the frontal region of her head.  She denies any shortness of breath, weakness, fever.  Her blood pressure is slightly elevated today however endorses taking blood pressure medication this morning.  She denies any nausea, vomiting, headache.  No known contact with anyone who is recently been sick with any respiratory symptoms.  She has used some over-the-counter nasal spray however cannot recall the name of the medication she is used without significant relief of symptoms.  Past Medical History:  Diagnosis Date  . Benign neoplasm of colon   . Diabetes mellitus without complication (HCC)    D5W currently a goal  . Essential hypertension, benign   . GERD (gastroesophageal reflux disease)   . Hyperlipidemia    Followed by PCP  . Hypertension   . Lumbago   . Migraine, unspecified, without mention of intractable migraine without mention of status migrainosus   . Osteoarthrosis, unspecified whether generalized or localized, unspecified site   . Palpitations    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  . Solitary pulmonary nodule 2012  . Urinary frequency     Patient Active Problem List   Diagnosis Date Noted  . PSVT (paroxysmal supraventricular tachycardia) ([place]) 08/03/2019  . DDD (degenerative disc disease), lumbar 10/16/2017  . Change in bowel habits 10/16/2017  . B12 deficiency 06/08/2017  . Primary osteoarthritis of right knee 02/05/2017    . Overactive bladder 05/16/2016  . Senile osteopenia 05/16/2016  . Onychomycosis 05/16/2016  . 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 Heart palpitations 10/16/2013  . Gastroesophageal reflux disease with esophagitis 07/19/2013  . Controlled type 2 diabetes mellitus with stage 3 chronic kidney disease, without long-term current use of insulin ([place]) 07/19/2013  . CKD (chronic kidney disease) stage 3, GFR 30-59 ml/min (HCC) 07/19/2013  . Diabetic autonomic neuropathy associated with type 2 diabetes mellitus ([place]) 07/19/2013  . Mixed hyperlipidemia due to type 2 diabetes mellitus ([place]) 07/19/2013  . History of PSVT (paroxysmal supraventricular tachycardia) 10/11/2012  . Essential hypertension   . Diabetes mellitus without complication ([place])   . Hypercholesterolemia with hypertriglyceridemia     Past Surgical History:  Procedure Laterality Date  . ABDOMINAL HYSTERECTOMY    . CARDIAC CATHETERIZATION  11/15/1994   Patent coronary arteries and normal LV function  . CARDIAC CATHETERIZATION  11/03/2003   Normal coronary arteries  . COLONOSCOPY  2010   Dr.Bucinni, Normal   . COLONOSCOPY  2004   Dr.Bucinni  . EXERCISE STRESS TEST  11/12/1987   Positive  . SHOULDER SURGERY Right   . TRANSTHORACIC ECHOCARDIOGRAM  05/19/2010   EF >55%, normal-mild    OB History    Gravida  3   Para  3   Term      Preterm      AB      Living  3     SAB      TAB  Ectopic      Multiple      Live Births               Home Medications    Prior to Admission medications   Medication Sig Start Date End Date Taking? Authorizing Provider  acetaminophen (TYLENOL) 500 MG tablet Take 500 mg by mouth as needed for pain.    [provider]  amLODipine (NORVASC) 5 MG tablet TAKE 1 TABLET BY MOUTH EVERYDAY AT BEDTIME 11/06/18   Reed, Tiffany L, DO  aspirin EC 81 MG tablet Take 81 mg by mouth daily.    [provider]  cetirizine (ZYRTEC) 10 MG tablet Take by mouth at bedtime.  05/30/19   [provider]  conjugated estrogens (PREMARIN) vaginal cream Pea sized amt PV 2 times per week for atrophic vaginitis 09/26/17   Gildardo Cranker, DO  [facility] CRANBERRY 500 MG CAPS TAKE 1 CAPSULE BY MOUTH TWICE A DAY 10/25/18   Reed, Tiffany L, DO  famotidine (PEPCID) 20 MG tablet Take 20 mg by mouth 2 (two) times daily. 05/17/19   [provider]  gabapentin (NEURONTIN) 100 MG capsule Take 100 mg by mouth daily. 06/23/19   [provider]  losartan (COZAAR) 50 MG tablet Take 50 mg by mouth 2 (two) times daily. 04/30/19   [provider]  lovastatin (MEVACOR) 20 MG tablet TAKE 1 TABLET BY MOUTH AT BEDTIME Patient taking differently: Take 40 mg by mouth at bedtime.  05/06/19   Reed, Tiffany L, DO  metFORMIN (GLUCOPHAGE) 1000 MG tablet TAKE ONE TABLET (1000MG ) BY MOUTH TWICE DAILY WITH MEALS 04/01/19   Reed, Tiffany L, DO  metoprolol tartrate (LOPRESSOR) 100 MG tablet TAKE 1 TABLET BY MOUTH TWICE A DAY 08/22/19   Leonie Man, MD  mirabegron ER (MYRBETRIQ) 25 MG TB24 tablet Take 1 tablet (25 mg total) by mouth daily. 01/23/17   Reed, Tiffany L, DO  Multiple Vitamin (MULTIVITAMIN) tablet Take 1 tablet by mouth daily.     [provider]  Omega-3 1000 MG CAPS Take 1 g by mouth.    [provider]  Probiotic Product (ALIGN) 4 MG CAPS Take 4 mg by mouth once.    [provider]  RESTORA RX 60-1.25 MG CAPS Take 1 capsule by mouth daily. 06/13/19   [provider]  solifenacin (VESICARE) 5 MG tablet Take 5 mg by mouth daily. 06/05/19   [provider]  vitamin B-12 (CYANOCOBALAMIN) 1000 MCG tablet Take 1,000 mcg by mouth daily.    [provider]    Family History Family History  Problem Relation Age of Onset  . CVA Mother   . Heart attack Father   . Cancer Father        Prostate cancer  . COPD Brother   . Diabetes Sister   . Colon cancer Sister   . Diabetes Sister   . Cancer Brother     Social  History Social History   Tobacco Use  . Smoking status: Former Smoker    Packs/day: 0.50    Years: 20.00    Pack years: 10.00    Types: Cigarettes    Quit date: 01/10/1977    Years since quitting: 42.7  . Smokeless tobacco: Never Used  . Tobacco comment: Quit at age 33  Vaping Use  . Vaping Use: Never used  Substance Use Topics  . Alcohol use: No  . Drug use: No     Allergies   Azithromycin, Statins, Tramadol,  and Gemfibrozil Review of Systems Review of Systems Pertinent negatives listed in HPI  Physical Exam Triage Vital Signs ED Triage Vitals  Enc Vitals Group     BP 10/05/19 1334 (!) 209/88     Pulse Rate 10/05/19 1334 64     Resp 10/05/19 1334 18     Temp 10/05/19 1334 98.5 F (36.9 C)     Temp Source 10/05/19 1334 Oral     SpO2 10/05/19 1334 94 %     Weight --      Height --      Head Circumference --      Peak Flow --      Pain Score 10/05/19 1335 7     Pain Loc --      Pain Edu? --      Excl. in [place]? --    No data found.  Updated Vital Signs BP (!) 209/88 (BP Location: Left Arm)   Pulse 64   Temp 98.5 F (36.9 C) (Oral)   Resp 18   SpO2 94%   Visual Acuity Right Eye Distance:   Left Eye Distance:   Bilateral Distance:    Right Eye Near:   Left Eye Near:    Bilateral Near:     Physical Exam Constitutional:      Appearance: She is not ill-appearing.  Eyes:     Extraocular Movements: Extraocular movements intact.     Pupils: Pupils are equal, round, and reactive to light.  Cardiovascular:     Rate and Rhythm: Normal rate and regular rhythm.  Pulmonary:     Effort: Pulmonary effort is normal.     Breath sounds: Normal breath sounds.  Musculoskeletal:     Cervical back: Normal range of motion and neck supple.  Lymphadenopathy:     Cervical: No cervical adenopathy.  Skin:    General: Skin is warm and dry.     Capillary Refill: Capillary refill takes less than 2 seconds.  Neurological:     Mental Status: She is alert and oriented to  person, place, and time.     GCS: GCS eye subscore is 4. GCS verbal subscore is 5. GCS motor subscore is 6.     Cranial Nerves: No facial asymmetry.     Coordination: Romberg sign negative.     Gait: Gait normal.  Psychiatric:        Mood and Affect: Mood normal.      UC Treatments / Results  Labs (all labs ordered are listed, but only abnormal results are displayed) Labs Reviewed  NOVEL CORONAVIRUS, NAA    EKG   Radiology No results found.  Procedures Procedures (including critical care time)  Medications Ordered in UC Medications - No data to display  Initial Impression / Assessment and Plan / UC Course  I have reviewed the triage vital signs and the nursing notes.  Pertinent labs & imaging results that were available during my care of the patient were reviewed by me and considered in my medical decision making (see chart for details).    COVID-19 test pending.  Symptoms are likely viral in nature as they have been present for less than a week therefore we will treat symptomatically.  Also patient is currently on a very strong antibiotic for an acute urinary tract infection and would like to avoid antibiotic treatment due to high risk of C. difficile.  Treatment per orders.  Patient's blood pressure is very elevated while she is in clinic today however she is  asymptomatic of any concerning symptoms has a completely normal neuro exam today.  Advised to follow-up with primary care provider blood pressure remains elevated.  Patient reports she does check her blood pressure daily has been checked this morning and reading was in the 762G systolic and less than 90 diastolic.  Follow-up with primary care provider if symptoms worsen or do not improve. Final Clinical Impressions(s) / UC Diagnoses   Final diagnoses:  Encounter for screening for COVID-19  Viral sinorhinitis  Accelerated hypertension     Discharge Instructions     Follow-up with your primary care provider  immediately regarding your elevation in blood pressure.  Blood pressure readings are critically high.  Check your blood pressure before you go to bed tonight as well if readings are constantly greater than 160/100 I would like for you to contact your primary care doctor after hours phone call number.  This could be related to your current course of illness.  If any shortness of breath or severe head pain develops go immediately to the emergency department.    ED Prescriptions    Medication Sig Dispense Auth. Provider   ipratropium (ATROVENT) 0.03 % nasal spray Place 2 sprays into both nostrils 2 (two) times daily. 30 mL Scot Jun, FNP   tizanidine (ZANAFLEX) 2 MG capsule Take 1 capsule (2 mg total) by mouth at bedtime as needed for muscle spasms. 20 capsule Scot Jun, FNP   benzonatate (TESSALON) 100 MG capsule Take 1-2 capsules (100-200 mg total) by mouth 3 (three) times daily as needed for cough. 40 capsule Scot Jun, FNP   cetirizine (ZYRTEC) 10 MG tablet Take 0.5 tablets (5 mg total) by mouth at bedtime. 30 tablet Scot Jun, FNP     PDMP not reviewed this encounter.   Scot Jun, FNP 10/05/19 [phone]

## 2019-10-07 LAB — SARS-COV-2, NAA 2 DAY TAT

## 2019-10-07 LAB — NOVEL CORONAVIRUS, NAA: SARS-CoV-2, NAA: NOT DETECTED

## 2019-10-30 ENCOUNTER — Other Ambulatory Visit: Payer: Self-pay | Admitting: Family Medicine

## 2019-11-23 ENCOUNTER — Other Ambulatory Visit: Payer: Self-pay | Admitting: Family Medicine

## 2019-11-23 NOTE — Telephone Encounter (Signed)
Rerouted to [facility] at [facility]

## 2020-01-15 ENCOUNTER — Telehealth: Payer: Medicare HMO | Admitting: Internal Medicine

## 2020-01-15 NOTE — Telephone Encounter (Signed)
I called [facility] to see if we could set up a follow up with Dr.Reed asap & asked if she had a new primary we have not seen her since 02/2018. I left her a vm, so if she calls back I was trying to schedule a follow up if she is still our patient

## 2020-02-03 ENCOUNTER — Ambulatory Visit: Payer: Medicare HMO | Admitting: Diagnostic Neuroimaging

## 2020-02-03 ENCOUNTER — Encounter: Payer: Self-pay | Admitting: *Deleted

## 2020-02-03 VITALS — BP 188/88 | HR 65 | Ht 65.0 in | Wt 136.6 lb

## 2020-02-03 DIAGNOSIS — R413 Other amnesia: Secondary | ICD-10-CM

## 2020-02-03 NOTE — Patient Instructions (Signed)
MILD MEMORY LOSS (? mild cognitive impairment vs other cause) - check MRI brain and labs - safety / supervision issues reviewed - daily physical activity / exercise (at least 15-30 minutes) - eat more plants / vegetables - increase social activities, brain stimulation, games, puzzles, hobbies, crafts, arts, music - aim for at least 7-8 hours sleep per night (or more) - avoid smoking and alcohol - caution with medications, finances, driving, living alone

## 2020-02-03 NOTE — Progress Notes (Signed)
[facility]  PATIENT: Mandy Perry DOB: 1940/02/01  REFERRING CLINICIAN: Cipriano Mile, NP HISTORY FROM: PATIENT  REASON FOR VISIT: NEW CONSULT    HISTORICAL  CHIEF COMPLAINT:  Chief Complaint  Patient presents with  . Memory Loss    Rm 6 New Pt  MMSE 24    HISTORY OF PRESENT ILLNESS:   80 year old female here for evaluation of memory loss.  Patient has noted 6 to 12 months of mild word recall, difficulty with driving directions and short-term memory loss.  Patient lives alone.  She takes care of all of her ADLs.  Her daughter helps her with some issues but patient is essentially independent.  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   REVIEW OF SYSTEMS: Full 14 system review of systems performed and negative with exception of: As per HPI.  ALLERGIES: Allergies  Allergen Reactions  . Azithromycin Hives  . Other Hives    Z pack  . Statins     Myalgias-high doses  . Tramadol Nausea Only  . Gemfibrozil Other (See Comments)    Leg cramps at night    HOME MEDICATIONS: Outpatient Medications Prior to Visit  Medication Sig Dispense Refill  . acetaminophen (TYLENOL) 500 MG tablet Take 500 mg by mouth as needed for pain.    Marland Kitchen aspirin EC 81 MG tablet Take 81 mg by mouth daily.    Marland Kitchen docusate sodium (STOOL SOFTENER) 100 MG capsule Take 100 mg by mouth 2 (two) times daily.    . famotidine (PEPCID) 20 MG tablet Take 20 mg by mouth 2 (two) times daily.    Marland Kitchen gabapentin (NEURONTIN) 100 MG capsule Take 100 mg by mouth daily.    Marland Kitchen ipratropium (ATROVENT) 0.03 % nasal spray Place 2 sprays into both nostrils 2 (two) times daily. 30 mL 0  . losartan (COZAAR) 50 MG tablet Take 50 mg by mouth 2 (two) times daily.    Marland Kitchen lovastatin (MEVACOR) 20 MG tablet TAKE 1 TABLET BY MOUTH AT BEDTIME (Patient taking differently: Take 40 mg by mouth at bedtime.) 90 tablet 1  . metFORMIN (GLUCOPHAGE)  1000 MG tablet TAKE ONE TABLET (1000MG ) BY MOUTH TWICE DAILY WITH MEALS 180 tablet 1  . metoprolol tartrate (LOPRESSOR) 100 MG tablet TAKE 1 TABLET BY MOUTH TWICE A DAY 180 tablet 3  . Multiple Vitamin (MULTIVITAMIN) tablet Take 1 tablet by mouth daily.     . Omega-3 1000 MG CAPS Take 1 g by mouth.    Marland Kitchen amLODipine (NORVASC) 5 MG tablet TAKE 1 TABLET BY MOUTH EVERYDAY AT BEDTIME (Patient not taking: Reported on 02/03/2020) 30 tablet 5  . benzonatate (TESSALON) 100 MG capsule Take 1-2 capsules (100-200 mg total) by mouth 3 (three) times daily as needed for cough. (Patient not taking: Reported on 02/03/2020) 40 capsule 0  . cetirizine (ZYRTEC) 10 MG tablet Take 0.5 tablets (5 mg total) by mouth at bedtime. (Patient not taking: Reported on 02/03/2020) 30 tablet 0  . conjugated estrogens (PREMARIN) vaginal cream Pea sized amt PV 2 times per week for atrophic vaginitis (Patient not taking: Reported on 02/03/2020) 42.5 g 3  . [facility] CRANBERRY 500 MG CAPS TAKE 1 CAPSULE BY MOUTH TWICE A DAY (Patient not taking: Reported on 02/03/2020) 60 capsule 3  . mirabegron ER (MYRBETRIQ) 25 MG TB24 tablet Take 1 tablet (25 mg total) by mouth daily. (Patient not taking: Reported on 02/03/2020) 30 tablet 3  .  Probiotic Product (ALIGN) 4 MG CAPS Take 4 mg by mouth once. (Patient not taking: Reported on 02/03/2020)    . RESTORA RX 60-1.25 MG CAPS Take 1 capsule by mouth daily. (Patient not taking: Reported on 02/03/2020)    . solifenacin (VESICARE) 5 MG tablet Take 5 mg by mouth daily. (Patient not taking: Reported on 02/03/2020)    . tizanidine (ZANAFLEX) 2 MG capsule Take 1 capsule (2 mg total) by mouth at bedtime as needed for muscle spasms. (Patient not taking: Reported on 02/03/2020) 20 capsule 0  . vitamin B-12 (CYANOCOBALAMIN) 1000 MCG tablet Take 1,000 mcg by mouth daily. (Patient not taking: Reported on 02/03/2020)     No facility-administered medications prior to visit.    PAST MEDICAL HISTORY: Past Medical History:   Diagnosis Date  . Aortic atherosclerosis ([place])   . Benign neoplasm of colon   . CKD (chronic kidney disease), stage III ([place])   . DDD (degenerative disc disease), lumbar   . Diabetes mellitus without complication (HCC)    B7S currently a goal  . Essential hypertension, benign   . GERD (gastroesophageal reflux disease)   . Hyperlipidemia    Followed by PCP  . Hypertension   . Lumbago   . Migraine, unspecified, without mention of intractable migraine without mention of status migrainosus   . OAB (overactive bladder)   . Osteoarthrosis, unspecified whether generalized or localized, unspecified site   . Palpitations    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  . Solitary pulmonary nodule 2012  . Urinary frequency     PAST SURGICAL HISTORY: Past Surgical History:  Procedure Laterality Date  . ABDOMINAL HYSTERECTOMY  1979  . CARDIAC CATHETERIZATION  11/15/1994   Patent coronary arteries and normal LV function  . CARDIAC CATHETERIZATION  11/03/2003   Normal coronary arteries  . CATARACT EXTRACTION, BILATERAL  2001  . COLONOSCOPY  2010   Dr.Bucinni, Normal   . COLONOSCOPY  2004   Dr.Bucinni  . EXERCISE STRESS TEST  11/12/1987   Positive  . SHOULDER SURGERY Right 2009  . TRANSTHORACIC ECHOCARDIOGRAM  05/19/2010   EF >55%, normal-mild    FAMILY HISTORY: Family History  Problem Relation Age of Onset  . CVA Mother   . Stroke Mother   . Heart attack Father   . Cancer Father        Prostate cancer  . COPD Brother   . Diabetes Sister   . Colon cancer Sister   . Diabetes Sister   . Cancer Brother     SOCIAL HISTORY: Social History   Socioeconomic History  . Marital status: Widowed    Spouse name: Not on file  . Number of children: 3  . Years of education: Not on file  . Highest education level: Master's degree (e.g., MA, MS, MEng, MEd, MSW, MBA)  Occupational History  . Occupation: retired Marine scientist  Tobacco Use  . Smoking status: Former Smoker    Packs/day: 0.50    Years:  20.00    Pack years: 10.00    Types: Cigarettes    Quit date: 01/10/1977    Years since quitting: 43.0  . Smokeless tobacco: Never Used  . Tobacco comment: Quit at age 74  Vaping Use  . Vaping Use: Never used  Substance and Sexual Activity  . Alcohol use: Never  . Drug use: No  . Sexual activity: Yes    Partners: Male    Comment: 1st intercourse- 59, partners- 21, current partner- 1 yr   Other Topics Concern  .  Not on file  Social History Narrative   02/03/20 She is  Widowed -husband died 15-Apr-2014   Retired Marine scientist    mother of 3, grandmother 55.    She had 10 years a history of about a half pack a day but quit 10 years ago.    Her exercising is limited by her osteoarthritis pains. But she tries to do some of the exercises with walking on a daily basis.   Alcohol none   Social Determinants of Health   Financial Resource Strain: Not on file  Food Insecurity: Not on file  Transportation Needs: Not on file  Physical Activity: Not on file  Stress: Not on file  Social Connections: Not on file  Intimate Partner Violence: Not on file     PHYSICAL EXAM  GENERAL EXAM/CONSTITUTIONAL: Vitals:  Vitals:   02/03/20 1126  BP: (!) 188/88  Pulse: 65  Weight: 136 lb 9.6 oz (62 kg)  Height: 5\' 5"  (1.651 m)     Body mass index is 22.73 kg/m. Wt Readings from Last 3 Encounters:  02/03/20 136 lb 9.6 oz (62 kg)  07/25/19 136 lb (61.7 kg)  08/30/18 137 lb (62.1 kg)     Patient is in no distress; well developed, nourished and groomed; neck is supple  CARDIOVASCULAR:  Examination of carotid arteries is normal; no carotid bruits  Regular rate and rhythm, no murmurs  Examination of peripheral vascular system by observation and palpation is normal  EYES:  Ophthalmoscopic exam of optic discs and posterior segments is normal; no papilledema or hemorrhages  No exam data present  MUSCULOSKELETAL:  Gait, strength, tone, movements noted in Neurologic exam below  NEUROLOGIC: MENTAL  STATUS:  MMSE - [place] Exam 02/03/2020 11/01/2017 05/16/2016  Orientation to time 5 5 5   Orientation to Place 5 5 5   Registration 3 3 3   Attention/ Calculation 0 5 5  Recall 2 3 2   Language- name 2 objects 2 2 2   Language- repeat 1 1 1   Language- follow 3 step command 3 3 3   Language- read & follow direction 1 1 1   Write a sentence 1 1 1   Copy design 1 1 1   Total score 24 30 29     awake, alert, oriented to person, place and time  recent and remote memory intact  normal attention and concentration  language fluent, comprehension intact, naming intact  fund of knowledge appropriate  CRANIAL NERVE:   2nd - no papilledema on fundoscopic exam  2nd, 3rd, 4th, 6th - pupils equal and reactive to light, visual fields full to confrontation, extraocular muscles intact, no nystagmus  5th - facial sensation symmetric  7th - facial strength symmetric  8th - hearing intact  9th - palate elevates symmetrically, uvula midline  11th - shoulder shrug symmetric  12th - tongue protrusion midline  MOTOR:   normal bulk and tone, full strength in the BUE, BLE  SENSORY:   normal and symmetric to light touch, temperature, vibration  COORDINATION:   finger-nose-finger, fine finger movements normal  REFLEXES:   deep tendon reflexes present and symmetric  GAIT/STATION:   narrow based gait     DIAGNOSTIC DATA (LABS, IMAGING, TESTING) - I reviewed patient records, labs, notes, testing and imaging myself where available.  Lab Results  Component Value Date   WBC 5.7 08/30/2018   HGB 13.1 08/30/2018   HCT 39.8 08/30/2018   MCV 91.3 08/30/2018   PLT 300 08/30/2018      Component Value Date/Time  NA 141 08/30/2018 1114   NA 138 03/16/2015 1041   K 4.2 08/30/2018 1114   CL 105 08/30/2018 1114   CO2 27 08/30/2018 1114   GLUCOSE 102 (H) 08/30/2018 1114   BUN 21 08/30/2018 1114   BUN 14 03/16/2015 1041   CREATININE 1.17 (H) 08/30/2018 1114   CALCIUM 9.7  08/30/2018 1114   PROT 7.3 08/30/2018 1114   PROT 7.4 03/16/2015 1041   ALBUMIN 4.1 05/04/2016 0818   ALBUMIN 4.5 03/16/2015 1041   AST 16 08/30/2018 1114   ALT 12 08/30/2018 1114   ALKPHOS 55 05/04/2016 0818   BILITOT 0.4 08/30/2018 1114   BILITOT 0.3 03/16/2015 1041   GFRNONAA 45 (L) 08/30/2018 1114   GFRAA 52 (L) 08/30/2018 1114   Lab Results  Component Value Date   CHOL 120 08/30/2018   HDL 42 (L) 08/30/2018   LDLCALC 58 08/30/2018   TRIG 115 08/30/2018   CHOLHDL 2.9 08/30/2018   Lab Results  Component Value Date   HGBA1C 6.3 (H) 08/30/2018   Lab Results  Component Value Date   VITAMINB12 386 05/23/2017   Lab Results  Component Value Date   TSH 0.98 08/30/2018      ASSESSMENT AND PLAN  80 y.o. year old female here with mild memory loss with MMSE of 24 out of 30, likely representing mild cognitive impairment.  We will proceed with further work-up.  Very healthy activities and safety precautions reviewed.  Dx:  1. Memory loss     PLAN:  MILD MEMORY LOSS (? mild cognitive impairment vs other cause) - check MRI brain and labs - safety / supervision issues reviewed - daily physical activity / exercise (at least 15-30 minutes) - eat more plants / vegetables - increase social activities, brain stimulation, games, puzzles, hobbies, crafts, arts, music - aim for at least 7-8 hours sleep per night (or more) - avoid smoking and alcohol - caution with medications, finances, driving, living alone  Orders Placed This Encounter  Procedures  . MR BRAIN WO CONTRAST  . Vitamin B12  . TSH   Return for pending if symptoms worsen or fail to improve.    Penni Bombard, MD [id number], [id number] AM Certified in Neurology, Neurophysiology and Neuroimaging  [facility] [address] [place], [place] [address] [phone]

## 2020-02-04 ENCOUNTER — Telehealth: Payer: Self-pay | Admitting: Diagnostic Neuroimaging

## 2020-02-04 LAB — VITAMIN B12: Vitamin B-12: 1507 pg/mL — ABNORMAL HIGH (ref 232–1245)

## 2020-02-04 LAB — TSH: TSH: 1.28 u[IU]/mL (ref 0.450–4.500)

## 2020-02-04 NOTE — Telephone Encounter (Signed)
Mcarthur Rossetti Josem Kaufmann: [phone] (exp. 02/04/20 to 03/05/20) order sent to GI. They will reach out to the patient to schedule.

## 2020-02-13 ENCOUNTER — Ambulatory Visit
Admission: RE | Admit: 2020-02-13 | Discharge: 2020-02-13 | Disposition: A | Discharge status: Home / Self Care | Payer: Medicare HMO | Source: Ambulatory Visit | Attending: Diagnostic Neuroimaging | Admitting: Diagnostic Neuroimaging

## 2020-02-13 DIAGNOSIS — R413 Other amnesia: Secondary | ICD-10-CM

## 2020-03-09 ENCOUNTER — Telehealth: Payer: Self-pay | Admitting: *Deleted

## 2020-03-09 NOTE — Telephone Encounter (Signed)
LVM informing patient her MRI brain results are unremarkable, no major findings. Left # for questions.

## 2020-10-19 NOTE — Progress Notes (Signed)
Cardiology Office Note:    Date:  10/19/2020   ID:  Mandy Perry, DOB 20-Mar-1940, MRN [id number]  PCP:  Patient, No Pcp Per (Inactive)   [place] [place] [facility] Cardiologist:  Glenetta Hew, MD      Referring MD: No ref. provider found   Follow-up evaluation for hypertension  History of Present Illness:    Mandy Perry is a 80 y.o. female with a hx of HTN, HLD, paroxysmal supraventricular tachycardia, palpitations, and GERD.  She was seen in follow-up by Dr. Ellyn Hack on 07/25/2019.  She reported a hospitalization due to cystitis 6/21.  She had been seen by Mandy Domino, DNP 7/20 for an episode of SVT.  Her medication was adjusted and she was supposed to be taking 100 mg of metoprolol in the morning and 50 mg in the evening.  However, she was taking 100 mg twice daily.  She reported feeling better on the higher dose of beta-blocker.  She did notice her heartbeat when she would lay down at night.  She denied increased or irregular heartbeat with stressors and exertion.  During her annual follow-up visit 07/25/2019.  She was doing overall well from a cardiac standpoint.  She denied further episodes of increased heart rate in the last year.  She did note brief occasional episodes of palpitations that lasted a few seconds.  She did notice that she was occasionally dizzy when she would wake up to urinate.  She described occasional heartburn.  She had been walking more and is trying to lose weight.  She had noted that with increased physical activity her palpitations have decreased.  She presents to the clinic today for follow-up evaluation states she noticed 3-4 episodes of chest discomfort last week.  She described the discomfort as a substernal type pressure.  It was relieved with belching and using the restroom.  She reports that she is no longer taking famotidine and acid.  She denied exertional chest pain and changes with her breathing.  We reviewed her chest discomfort and the  cause being acid reflux.  She felt that her discomfort was probably related to indigestion as well.  She denies any further episodes of palpitations.  She continues to be fairly physically active walking 3-4 times per week.  She reports that she has not yet taken her blood pressure medication this morning and that it is well controlled at home.  I will give her the salty 6 diet sheet, give her a blood pressure log, continue her current medications, and have her follow-up in 1 year.  Today she denies chest pain, shortness of breath, lower extremity edema, fatigue, palpitations, melena, hematuria, hemoptysis, diaphoresis, weakness, presyncope, syncope, orthopnea, and PND.   Past Medical History:  Diagnosis Date   Aortic atherosclerosis ([place])    Benign neoplasm of colon    CKD (chronic kidney disease), stage III (HCC)    DDD (degenerative disc disease), lumbar    Diabetes mellitus without complication ([place])    W1U currently a goal   Essential hypertension, benign    GERD (gastroesophageal reflux disease)    Hyperlipidemia    Followed by PCP   Hypertension    Lumbago    Migraine, unspecified, without mention of intractable migraine without mention of status migrainosus    OAB (overactive bladder)    Osteoarthrosis, unspecified whether generalized or localized, unspecified site    Palpitations    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   Solitary pulmonary nodule 2012   Urinary frequency  Past Surgical History:  Procedure Laterality Date   ABDOMINAL HYSTERECTOMY  1979   CARDIAC CATHETERIZATION  11/15/1994   Patent coronary arteries and normal LV function   CARDIAC CATHETERIZATION  11/03/2003   Normal coronary arteries   CATARACT EXTRACTION, BILATERAL  April 25, 1999   COLONOSCOPY  24-Apr-2008   Dr.Bucinni, Normal    COLONOSCOPY  04-25-02   Dr.Bucinni   EXERCISE STRESS TEST  11/12/1987   Positive   SHOULDER SURGERY Right 2007-04-25   TRANSTHORACIC ECHOCARDIOGRAM  05/19/2010   EF >55%, normal-mild    Current  Medications: No outpatient medications have been marked as taking for the 10/20/20 encounter (Appointment) with Mandy Pelton, NP.     Allergies:   Azithromycin, Other, Statins, Tramadol, and Gemfibrozil   Social History   Socioeconomic History   Marital status: Widowed    Spouse name: Not on file   Number of children: 3   Years of education: Not on file   Highest education level: Master's degree (e.g., MA, MS, MEng, MEd, MSW, MBA)  Occupational History   Occupation: retired Marine scientist  Tobacco Use   Smoking status: Former    Packs/day: 0.50    Years: 20.00    Pack years: 10.00    Types: Cigarettes    Quit date: 01/10/1977    Years since quitting: 43.8   Smokeless tobacco: Never   Tobacco comments:    Quit at age 59  Vaping Use   Vaping Use: Never used  Substance and Sexual Activity   Alcohol use: Never   Drug use: No   Sexual activity: Yes    Partners: Male    Comment: 1st intercourse- 23, partners- 47, current partner- 1 yr   Other Topics Concern   Not on file  Social History Narrative   02/03/20 She is  Widowed -husband died April 25, 2014   Retired Marine scientist    mother of 40, grandmother 58.    She had 10 years a history of about a half pack a day but quit 10 years ago.    Her exercising is limited by her osteoarthritis pains. But she tries to do some of the exercises with walking on a daily basis.   Alcohol none   Social Determinants of Health   Financial Resource Strain: Not on file  Food Insecurity: Not on file  Transportation Needs: Not on file  Physical Activity: Not on file  Stress: Not on file  Social Connections: Not on file     Family History: The patient's family history includes COPD in her brother; CVA in her mother; Cancer in her brother and father; Colon cancer in her sister; Diabetes in her sister and sister; Heart attack in her father; Stroke in her mother.  ROS:   Please see the history of present illness.     All other systems reviewed and are  negative.   Risk Assessment/Calculations:           Physical Exam:    VS:  There were no vitals taken for this visit.    Wt Readings from Last 3 Encounters:  02/03/20 136 lb 9.6 oz (62 kg)  07/25/19 136 lb (61.7 kg)  08/30/18 137 lb (62.1 kg)     GEN:  Well nourished, well developed in no acute distress HEENT: Normal NECK: No JVD; No carotid bruits LYMPHATICS: No lymphadenopathy CARDIAC: RRR, no murmurs, rubs, gallops RESPIRATORY:  Clear to auscultation without rales, wheezing or rhonchi  ABDOMEN: Soft, non-tender, non-distended MUSCULOSKELETAL:  No edema; No deformity  SKIN:  Warm and dry NEUROLOGIC:  Alert and oriented x 3 PSYCHIATRIC:  Normal affect    EKGs/Labs/Other Studies Reviewed:    The following studies were reviewed today:   EKG:  EKG is  ordered today.  The ekg ordered today demonstrates sinus bradycardia with sinus arrhythmia minimal voltage criteria for LVH nonspecific ST abnormality 58 bpm  Recent Labs: 02/03/2020: TSH 1.280  Recent Lipid Panel    Component Value Date/Time   CHOL 120 08/30/2018 1114   CHOL 139 11/17/2014 0944   TRIG 115 08/30/2018 1114   HDL 42 (L) 08/30/2018 1114   HDL 40 11/17/2014 0944   CHOLHDL 2.9 08/30/2018 1114   VLDL 29 05/04/2016 0818   LDLCALC 58 08/30/2018 1114    ASSESSMENT & PLAN    Chest discomfort-appears to be related to acid reflux/GERD.  She had stopped her famotidine medication. GERD diet sheet given Restart famotidine 20 mg twice daily Follow-up with PCP if needed  Essential hypertension-BP today 160/84.  Did not take her medication prior to her follow-up appointment.  Well-controlled at home. Continue losartan, metoprolol Heart healthy low-sodium diet-salty 6 given Increase physical activity as tolerated Maintain blood pressure log Lab work requested from PCP  Palpitations-stable.  No increased episodes of accelerated or irregular heartbeats.  Previously felt to be PACs or PVCs continues to be  physically active with walking daily. Continue metoprolol Heart healthy low-sodium diet-salty 6 given Increase physical activity as tolerated Avoid triggers caffeine, chocolate, EtOH, dehydration etc.  Paroxysmal supraventricular tachycardia-denies episodes.  Previously instructed on vagal maneuvers.  Reviewed today. Continue metoprolol Heart healthy low-sodium diet-salty 6 given Increase physical activity as tolerated  Mixed hyperlipidemia-LDL 58 on 08/30/2018 Continue lovastatin, aspirin, omega-3 fatty acids Heart healthy low-sodium high-fiber diet increase physical activity as tolerated Repeat fasting lipids and LFTs  Disposition: Follow-up with Dr. Ellyn Hack in 1 year.       Medication Adjustments/Labs and Tests Ordered: Current medicines are reviewed at length with the patient today.  Concerns regarding medicines are outlined above.  No orders of the defined types were placed in this encounter.  No orders of the defined types were placed in this encounter.   There are no Patient Instructions on file for this visit.   Signed, Mandy Pelton, NP  10/19/2020 6:20 AM      Notice: This dictation was prepared with Dragon dictation along with smaller phrase technology. Any transcriptional errors that result from this process are unintentional and may not be corrected upon review.  I spent 14 minutes examining this patient, reviewing medications, and using patient centered shared decision making involving her cardiac care.  Prior to her visit I spent greater than 20 minutes reviewing her past medical history,  medications, and prior cardiac tests.

## 2020-10-20 ENCOUNTER — Ambulatory Visit (HOSPITAL_BASED_OUTPATIENT_CLINIC_OR_DEPARTMENT_OTHER): Payer: Medicare HMO | Admitting: General Practice

## 2020-10-20 ENCOUNTER — Other Ambulatory Visit: Payer: Self-pay

## 2020-10-20 ENCOUNTER — Encounter (HOSPITAL_BASED_OUTPATIENT_CLINIC_OR_DEPARTMENT_OTHER): Payer: Self-pay | Admitting: General Practice

## 2020-10-20 VITALS — BP 160/84 | HR 58 | Ht 65.0 in | Wt 126.4 lb

## 2020-10-20 DIAGNOSIS — E1169 Type 2 diabetes mellitus with other specified complication: Secondary | ICD-10-CM | POA: Diagnosis not present

## 2020-10-20 DIAGNOSIS — R002 Palpitations: Secondary | ICD-10-CM

## 2020-10-20 DIAGNOSIS — I10 Essential (primary) hypertension: Secondary | ICD-10-CM | POA: Diagnosis not present

## 2020-10-20 DIAGNOSIS — E782 Mixed hyperlipidemia: Secondary | ICD-10-CM

## 2020-10-20 DIAGNOSIS — I471 Supraventricular tachycardia: Secondary | ICD-10-CM | POA: Diagnosis not present

## 2020-10-20 NOTE — Patient Instructions (Signed)
Medication Instructions:  Your physician recommends that you continue on your current medications as directed. Please refer to the Current Medication list given to you today.  Restart Famotidine 20 mg twice daily (OTC)  *If you need a refill on your cardiac medications before your next appointment, please call your pharmacy*  Follow-Up: At [facility], you and your health needs are our priority.  As part of our continuing mission to provide you with exceptional heart care, we have created designated Provider Care Teams.  These Care Teams include your primary Cardiologist (physician) and Advanced Practice Providers (APPs -  Physician Assistants and Nurse Practitioners) who all work together to provide you with the care you need, when you need it.  We recommend signing up for the patient portal called "MyChart".  Sign up information is provided on this After Visit Summary.  MyChart is used to connect with patients for Virtual Visits (Telemedicine).  Patients are able to view lab/test results, encounter notes, upcoming appointments, etc.  Non-urgent messages can be sent to your provider as well.   To learn more about what you can do with MyChart, go to [email or web address].    Your next appointment:   12 month(s)  The format for your next appointment:   In Person  Provider:   You may see Glenetta Hew, MD or one of the following Advanced Practice Providers on your designated Care Team:   Rosaria Ferries, PA-C Caron Presume, PA-C Jory Sims, DNP, ANP Coletta Memos, NP   Other Instructions Coletta Memos, NP has recommended keeping a blood pressure log and maintaining your current physical activity. Please review the information below. Thank you!  Tips to Measure your Blood Pressure Correctly  To determine whether you have hypertension, a medical professional will take a blood pressure reading. How you prepare for the test, the position of your arm, and other factors can  change a blood pressure reading by 10% or more. That could be enough to hide high blood pressure, start you on a drug you don't really need, or lead your doctor to incorrectly adjust your medications.  National and international guidelines offer specific instructions for measuring blood pressure. If a doctor, nurse, or medical assistant isn't doing it right, don't hesitate to ask him or her to get with the guidelines.  Here's what you can do to ensure a correct reading:  Don't drink a caffeinated beverage or smoke during the 30 minutes before the test.  Sit quietly for five minutes before the test begins.  During the measurement, sit in a chair with your feet on the floor and your arm supported so your elbow is at about heart level.  The inflatable part of the cuff should completely cover at least 80% of your upper arm, and the cuff should be placed on bare skin, not over a shirt.  Don't talk during the measurement.  Have your blood pressure measured twice, with a brief break in between. If the readings are different by 5 points or more, have it done a third time.  In 2017, new guidelines from the [employer], the [employer] of Cardiology, and nine other health organizations lowered the diagnosis of high blood pressure to 130/80 mm Hg or higher for all adults. The guidelines also redefined the various blood pressure categories to now include normal, elevated, Stage 1 hypertension, Stage 2 hypertension, and hypertensive crisis (see "Blood pressure categories").  Blood pressure categories  Blood pressure category SYSTOLIC (upper number)  DIASTOLIC (lower number)  Normal  Less than 120 mm Hg and Less than 80 mm Hg  Elevated 120-129 mm Hg and Less than 80 mm Hg  High blood pressure: Stage 1 hypertension 130-139 mm Hg or 80-89 mm Hg  High blood pressure: Stage 2 hypertension 140 mm Hg or higher or 90 mm Hg or higher  Hypertensive crisis (consult your doctor immediately) Higher  than 180 mm Hg and/or Higher than 120 mm Hg  Source: American Heart Association and American Stroke Association. For more on getting your blood pressure under control, buy Controlling Your Blood Pressure, a Special Health Report from [facility].  Gastroesophageal Reflux Disease, Adult Gastroesophageal reflux (GER) happens when acid from the stomach flows up into the tube that connects the mouth and the stomach (esophagus). Normally, food travels down the esophagus and stays in the stomach to be digested. With GER, food and stomach acid sometimes move back up into the esophagus. You may have a disease called gastroesophageal reflux disease (GERD) if the reflux: Happens often. Causes frequent or very bad symptoms. Causes problems such as damage to the esophagus. When this happens, the esophagus becomes sore and swollen. Over time, GERD can make small holes (ulcers) in the lining of the esophagus. What are the causes? This condition is caused by a problem with the muscle between the esophagus and the stomach. When this muscle is weak or not normal, it does not close properly to keep food and acid from coming back up from the stomach. The muscle can be weak because of: Tobacco use. Pregnancy. Having a certain type of hernia (hiatal hernia). Alcohol use. Certain foods and drinks, such as coffee, chocolate, onions, and peppermint. What increases the risk? Being overweight. Having a disease that affects your connective tissue. Taking NSAIDs, such a ibuprofen. What are the signs or symptoms? Heartburn. Difficult or painful swallowing. The feeling of having a lump in the throat. A bitter taste in the mouth. Bad breath. Having a lot of saliva. Having an upset or bloated stomach. Burping. Chest pain. Different conditions can cause chest pain. Make sure you see your doctor if you have chest pain. Shortness of breath or wheezing. A long-term cough or a cough at night. Wearing away  of the surface of teeth (tooth enamel). Weight loss. How is this treated? Making changes to your diet. Taking medicine. Having surgery. Treatment will depend on how bad your symptoms are. Follow these instructions at home: Eating and drinking  Follow a diet as told by your doctor. You may need to avoid foods and drinks such as: Coffee and tea, with or without caffeine. Drinks that contain alcohol. Energy drinks and sports drinks. Bubbly (carbonated) drinks or sodas. Chocolate and cocoa. Peppermint and mint flavorings. Garlic and onions. Horseradish. Spicy and acidic foods. These include peppers, chili powder, curry powder, vinegar, hot sauces, and BBQ sauce. Citrus fruit juices and citrus fruits, such as oranges, lemons, and limes. Tomato-based foods. These include red sauce, chili, salsa, and pizza with red sauce. Fried and fatty foods. These include donuts, french fries, potato chips, and high-fat dressings. High-fat meats. These include hot dogs, rib eye steak, sausage, ham, and bacon. High-fat dairy items, such as whole milk, butter, and cream cheese. Eat small meals often. Avoid eating large meals. Avoid drinking large amounts of liquid with your meals. Avoid eating meals during the 2-3 hours before bedtime. Avoid lying down right after you eat. Do not exercise right after you eat. Lifestyle  Do not smoke or use any products that  contain nicotine or tobacco. If you need help quitting, ask your doctor. Try to lower your stress. If you need help doing this, ask your doctor. If you are overweight, lose an amount of weight that is healthy for you. Ask your doctor about a safe weight loss goal. General instructions Pay attention to any changes in your symptoms. Take over-the-counter and prescription medicines only as told by your doctor. Do not take aspirin, ibuprofen, or other NSAIDs unless your doctor says it is okay. Wear loose clothes. Do not wear anything tight around your  waist. Raise (elevate) the head of your bed about 6 inches (15 cm). You may need to use a wedge to do this. Avoid bending over if this makes your symptoms worse. Keep all follow-up visits. Contact a doctor if: You have new symptoms. You lose weight and you do not know why. You have trouble swallowing or it hurts to swallow. You have wheezing or a cough that keeps happening. You have a hoarse voice. Your symptoms do not get better with treatment. Get help right away if: You have sudden pain in your arms, neck, jaw, teeth, or back. You suddenly feel sweaty, dizzy, or light-headed. You have chest pain or shortness of breath. You vomit and the vomit is green, yellow, or black, or it looks like blood or coffee grounds. You faint. Your poop (stool) is red, bloody, or black. You cannot swallow, drink, or eat. These symptoms may represent a serious problem that is an emergency. Do not wait to see if the symptoms will go away. Get medical help right away. Call your local emergency services (911 in the [place] Do not drive yourself to the hospital. Summary If a person has gastroesophageal reflux disease (GERD), food and stomach acid move back up into the esophagus and cause symptoms or problems such as damage to the esophagus. Treatment will depend on how bad your symptoms are. Follow a diet as told by your doctor. Take all medicines only as told by your doctor. This information is not intended to replace advice given to you by your health care provider. Make sure you discuss any questions you have with your health care provider. Document Revised: 07/08/2019 Document Reviewed: 07/08/2019 Elsevier Patient Education  [employer].

## 2020-11-17 ENCOUNTER — Other Ambulatory Visit: Payer: Self-pay | Admitting: Student

## 2020-11-17 DIAGNOSIS — Z139 Encounter for screening, unspecified: Secondary | ICD-10-CM

## 2020-11-21 ENCOUNTER — Other Ambulatory Visit: Payer: Self-pay

## 2020-11-21 ENCOUNTER — Ambulatory Visit
Admission: RE | Admit: 2020-11-21 | Discharge: 2020-11-21 | Disposition: A | Discharge status: Home / Self Care | Payer: Medicare HMO | Source: Ambulatory Visit | Attending: Student | Admitting: Student

## 2020-11-21 DIAGNOSIS — Z139 Encounter for screening, unspecified: Secondary | ICD-10-CM

## 2021-04-06 ENCOUNTER — Other Ambulatory Visit: Payer: Self-pay | Admitting: Physician Assistant

## 2021-04-06 DIAGNOSIS — R1011 Right upper quadrant pain: Secondary | ICD-10-CM

## 2021-04-06 DIAGNOSIS — R1013 Epigastric pain: Secondary | ICD-10-CM

## 2021-04-14 ENCOUNTER — Ambulatory Visit
Admission: RE | Admit: 2021-04-14 | Discharge: 2021-04-14 | Disposition: A | Discharge status: Home / Self Care | Payer: Medicare PPO | Source: Ambulatory Visit | Attending: Physician Assistant | Admitting: Physician Assistant

## 2021-04-14 DIAGNOSIS — R1011 Right upper quadrant pain: Secondary | ICD-10-CM

## 2021-04-14 DIAGNOSIS — R1013 Epigastric pain: Secondary | ICD-10-CM

## 2021-04-16 ENCOUNTER — Other Ambulatory Visit: Payer: Self-pay | Admitting: Physician Assistant

## 2021-04-16 DIAGNOSIS — K838 Other specified diseases of biliary tract: Secondary | ICD-10-CM

## 2021-04-16 DIAGNOSIS — R1011 Right upper quadrant pain: Secondary | ICD-10-CM

## 2021-07-10 ENCOUNTER — Ambulatory Visit
Admission: RE | Admit: 2021-07-10 | Discharge: 2021-07-10 | Disposition: A | Discharge status: Home / Self Care | Payer: Medicare PPO | Source: Ambulatory Visit | Attending: Physician Assistant | Admitting: Physician Assistant

## 2021-07-10 DIAGNOSIS — K838 Other specified diseases of biliary tract: Secondary | ICD-10-CM

## 2021-07-10 DIAGNOSIS — R1011 Right upper quadrant pain: Secondary | ICD-10-CM

## 2021-07-10 MED ORDER — GADOBENATE DIMEGLUMINE 529 MG/ML IV SOLN
12.0000 mL | Freq: Once | INTRAVENOUS | Status: AC | PRN
  Administered 2021-07-10: 12 mL via INTRAVENOUS

## 2021-10-12 ENCOUNTER — Other Ambulatory Visit: Payer: Self-pay | Admitting: Student

## 2021-10-12 DIAGNOSIS — Z1231 Encounter for screening mammogram for malignant neoplasm of breast: Secondary | ICD-10-CM

## 2021-11-23 ENCOUNTER — Ambulatory Visit
Admission: RE | Admit: 2021-11-23 | Discharge: 2021-11-23 | Disposition: A | Discharge status: Home / Self Care | Payer: Medicare PPO | Source: Ambulatory Visit | Attending: Student | Admitting: Student

## 2021-11-23 DIAGNOSIS — Z1231 Encounter for screening mammogram for malignant neoplasm of breast: Secondary | ICD-10-CM

## 2022-03-31 ENCOUNTER — Ambulatory Visit
Admission: RE | Admit: 2022-03-31 | Discharge: 2022-03-31 | Disposition: A | Discharge status: Home / Self Care | Payer: Medicare HMO | Source: Ambulatory Visit | Attending: Student | Admitting: Student

## 2022-03-31 ENCOUNTER — Other Ambulatory Visit: Payer: Self-pay | Admitting: Student

## 2022-03-31 DIAGNOSIS — R053 Chronic cough: Secondary | ICD-10-CM

## 2022-09-11 ENCOUNTER — Ambulatory Visit
Admission: EM | Admit: 2022-09-11 | Discharge: 2022-09-11 | Disposition: A | Discharge status: Home / Self Care | Payer: Medicare HMO

## 2022-09-11 DIAGNOSIS — I10 Essential (primary) hypertension: Secondary | ICD-10-CM | POA: Diagnosis not present

## 2022-09-11 NOTE — ED Provider Notes (Signed)
[facility]    CSN: [id number] Arrival date & time: 09/11/22  1224      History   Chief Complaint Chief Complaint  Patient presents with   Hypertension    HPI Mandy Perry is a 82 y.o. female.   Patient here today for evaluation of elevated blood pressure reading earlier when she went to [facility].  She states that when she checked in on their machine it was 220/110.  She states she has a very very mild headache at times.  She denies any dizziness and has not had any chest pain or shortness of breath.  She did take her blood pressure medication this morning as she typically does and has another dose scheduled for this evening.  She has not had any numbness or tingling.  She denies any visual changes.  The history is provided by the patient.  Hypertension Associated symptoms include headaches. Pertinent negatives include no abdominal pain.    Past Medical History:  Diagnosis Date   Aortic atherosclerosis (HCC)    Benign neoplasm of colon    CKD (chronic kidney disease), stage III (HCC)    DDD (degenerative disc disease), lumbar    Diabetes mellitus without complication (HCC)    A1c currently a goal   Essential hypertension, benign    GERD (gastroesophageal reflux disease)    Hyperlipidemia    Followed by PCP   Hypertension    Lumbago    Migraine, unspecified, without mention of intractable migraine without mention of status migrainosus    OAB (overactive bladder)    Osteoarthrosis, unspecified whether generalized or localized, unspecified site    Palpitations    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   Solitary pulmonary nodule 2012   Urinary frequency     Patient Active Problem List   Diagnosis Date Noted   PSVT (paroxysmal supraventricular tachycardia) 08/03/2019   DDD (degenerative disc disease), lumbar 10/16/2017   Change in bowel habits 10/16/2017   B12 deficiency 06/08/2017   Primary osteoarthritis of right knee 02/05/2017   Overactive bladder  05/16/2016   Senile osteopenia 05/16/2016   Onychomycosis 05/16/2016   Atrophic vaginitis 01/24/2015   Heart palpitations 10/16/2013   Gastroesophageal reflux disease with esophagitis 07/19/2013   Controlled type 2 diabetes mellitus with stage 3 chronic kidney disease, without long-term current use of insulin (HCC) 07/19/2013   CKD (chronic kidney disease) stage 3, GFR 30-59 ml/min (HCC) 07/19/2013   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) 07/19/2013   Mixed hyperlipidemia due to type 2 diabetes mellitus (HCC) 07/19/2013   History of PSVT (paroxysmal supraventricular tachycardia) 10/11/2012   Essential hypertension    Diabetes mellitus without complication (HCC)    Hypercholesterolemia with hypertriglyceridemia     Past Surgical History:  Procedure Laterality Date   ABDOMINAL HYSTERECTOMY  1979   CARDIAC CATHETERIZATION  11/15/1994   Patent coronary arteries and normal LV function   CARDIAC CATHETERIZATION  11/03/2003   Normal coronary arteries   CATARACT EXTRACTION, BILATERAL  2001   COLONOSCOPY  2010   Dr.Bucinni, Normal    COLONOSCOPY  2004   Dr.Bucinni   EXERCISE STRESS TEST  11/12/1987   Positive   SHOULDER SURGERY Right 2009   TRANSTHORACIC ECHOCARDIOGRAM  05/19/2010   EF >55%, normal-mild    OB History     Gravida  3   Para  3   Term      Preterm      AB      Living  3      SAB      IAB      Ectopic      Multiple      Live Births               Home Medications    Prior to Admission medications   Medication Sig Start Date End Date Taking? Authorizing Provider  albuterol (VENTOLIN HFA) 108 (90 Base) MCG/ACT inhaler Inhale 2 puffs into the lungs every 4 (four) hours as needed for wheezing or shortness of breath. 07/04/22  Yes [provider]  allopurinol (ZYLOPRIM) 100 MG tablet Take 100 mg by mouth daily.   Yes [provider]  aspirin EC 81 MG tablet Take 81 mg by mouth daily.   Yes  [provider]  Cholecalciferol 125 MCG (5000 UT) capsule Take 5,000 Units by mouth daily. 08/13/15  Yes [provider]  fluticasone (FLONASE) 50 MCG/ACT nasal spray Place 2 sprays into both nostrils daily. 03/31/22  Yes [provider]  hydrALAZINE (APRESOLINE) 25 MG tablet Take 25 mg by mouth 2 (two) times daily. 09/11/22  Yes [provider]  hydrochlorothiazide (HYDRODIURIL) 25 MG tablet Take 25 mg by mouth daily. 01/24/15  Yes [provider]  lansoprazole (PREVACID) 30 MG capsule Take 30 mg by mouth daily at 6 (six) AM. 01/24/15  Yes [provider]  losartan (COZAAR) 50 MG tablet Take 50 mg by mouth 2 (two) times daily. 04/30/19  Yes [provider]  lovastatin (MEVACOR) 20 MG tablet TAKE 1 TABLET BY MOUTH AT BEDTIME 05/06/19  Yes Reed, Tiffany L, DO  memantine (NAMENDA) 10 MG tablet Take 10 mg by mouth daily. 09/08/22  Yes [provider]  metFORMIN (GLUCOPHAGE) 1000 MG tablet TAKE ONE TABLET (1000MG ) BY MOUTH TWICE DAILY WITH MEALS 04/01/19  Yes Reed, Tiffany L, DO  metoprolol succinate (TOPROL-XL) 100 MG 24 hr tablet Take 100 mg by mouth 2 (two) times daily. 05/20/22  Yes [provider]  mirabegron ER (MYRBETRIQ) 25 MG TB24 tablet Take 1 tablet (25 mg total) by mouth daily. 01/23/17  Yes Reed, Tiffany L, DO  Omega-3 1000 MG CAPS Take 1 g by mouth.   Yes [provider]  omeprazole (PRILOSEC) 40 MG capsule Take 40 mg by mouth daily. 03/31/22  Yes [provider]  tolterodine (DETROL LA) 4 MG 24 hr capsule Take 4 mg by mouth daily. 04/29/16  Yes [provider]  acetaminophen (TYLENOL) 500 MG tablet Take 500 mg by mouth as needed for pain.    [provider]  amLODipine (NORVASC) 5 MG tablet TAKE 1 TABLET BY MOUTH EVERYDAY AT BEDTIME 11/06/18   Reed, Tiffany L, DO  benzonatate (TESSALON) 100 MG capsule Take 1-2 capsules (100-200 mg total) by mouth 3 (three) times daily as needed for  cough. 10/05/19   Bing Neighbors, NP  cetirizine (ZYRTEC) 10 MG tablet Take 0.5 tablets (5 mg total) by mouth at bedtime. 10/05/19   Bing Neighbors, NP  conjugated estrogens (PREMARIN) vaginal cream Pea sized amt PV 2 times per week for atrophic vaginitis 09/26/17   Kirt Boys, DO  [employer] CRANBERRY 500 MG CAPS TAKE 1 CAPSULE BY MOUTH TWICE A DAY 10/25/18   Reed, Tiffany L, DO  docusate sodium (COLACE) 100 MG capsule Take 100 mg by mouth 2 (two) times daily.    [provider]  famotidine (PEPCID) 20 MG tablet Take 20 mg by mouth 2 (two) times daily. 05/17/19  [provider]  gabapentin (NEURONTIN) 100 MG capsule Take 100 mg by mouth daily. 06/23/19   [provider]  ipratropium (ATROVENT) 0.03 % nasal spray Place 2 sprays into both nostrils 2 (two) times daily. 10/05/19   Bing Neighbors, NP  metoprolol tartrate (LOPRESSOR) 100 MG tablet TAKE 1 TABLET BY MOUTH TWICE A DAY 08/22/19   Marykay Lex, MD  Multiple Vitamin (MULTIVITAMIN) tablet Take 1 tablet by mouth daily.     [provider]  Omeprazole 20 MG TBEC Take 20 mg by mouth daily at 6 (six) AM.    [provider]  Probiotic Product (ALIGN) 4 MG CAPS Take 4 mg by mouth once.    [provider]  RESTORA RX 60-1.25 MG CAPS Take 1 capsule by mouth daily. 06/13/19   [provider]  solifenacin (VESICARE) 5 MG tablet Take 5 mg by mouth daily. 06/05/19   [provider]  tizanidine (ZANAFLEX) 2 MG capsule Take 1 capsule (2 mg total) by mouth at bedtime as needed for muscle spasms. 10/05/19   Bing Neighbors, NP  vitamin B-12 (CYANOCOBALAMIN) 1000 MCG tablet Take 1,000 mcg by mouth daily.    [provider]    Family History Family History  Problem Relation Age of Onset   CVA Mother    Stroke Mother    Heart attack Father    Cancer Father        Prostate cancer   COPD Brother    Diabetes Sister    Colon cancer Sister    Diabetes Sister    Cancer  Brother     Social History Social History   Tobacco Use   Smoking status: Former    Current packs/day: 0.00    Average packs/day: 0.5 packs/day for 20.0 years (10.0 ttl pk-yrs)    Types: Cigarettes    Start date: 01/10/1957    Quit date: 01/10/1977    Years since quitting: 45.6   Smokeless tobacco: Never   Tobacco comments:    Quit at age 26  Vaping Use   Vaping status: Never Used  Substance Use Topics   Alcohol use: Not Currently   Drug use: No     Allergies   Azithromycin, Other, Statins, Tramadol, Tramadol hcl, and Gemfibrozil   Review of Systems Review of Systems  Constitutional:  Negative for chills and fever.  Eyes:  Negative for discharge and redness.  Gastrointestinal:  Negative for abdominal pain, nausea and vomiting.  Neurological:  Positive for headaches. Negative for numbness.     Physical Exam Triage Vital Signs ED Triage Vitals  Encounter Vitals Group     BP 09/11/22 1239 (!) 178/87     Systolic BP Percentile --      Diastolic BP Percentile --      Pulse Rate 09/11/22 1239 87     Resp 09/11/22 1239 18     Temp 09/11/22 1239 98 F (36.7 C)     Temp Source 09/11/22 1239 Oral     SpO2 09/11/22 1239 97 %     Weight 09/11/22 1237 126 lb 5.2 oz (57.3 kg)     Height 09/11/22 1237 5\' 5"  (1.651 m)     Head Circumference --      Peak Flow --      Pain Score 09/11/22 1232 0     Pain Loc --      Pain Education --      Exclude from Growth Chart --  No data found.  Updated Vital Signs BP 129/64 (BP Location: Left Arm)   Pulse 87   Temp 98 F (36.7 C) (Oral)   Resp 18   Ht 5\' 5"  (1.651 m)   Wt 126 lb 5.2 oz (57.3 kg)   SpO2 97%   BMI 21.02 kg/m      Physical Exam Vitals and nursing note reviewed.  Constitutional:      General: She is not in acute distress.    Appearance: Normal appearance. She is not ill-appearing.  HENT:     Head: Normocephalic and atraumatic.     Nose: Nose normal. No congestion or rhinorrhea.  Eyes:     Extraocular  Movements: Extraocular movements intact.     Conjunctiva/sclera: Conjunctivae normal.     Pupils: Pupils are equal, round, and reactive to light.  Cardiovascular:     Rate and Rhythm: Normal rate and regular rhythm.  Pulmonary:     Effort: Pulmonary effort is normal. No respiratory distress.     Breath sounds: Normal breath sounds. No wheezing, rhonchi or rales.  Neurological:     Mental Status: She is alert.     Comments: No facial droop, no slurred speech, normal finger-to-nose  Psychiatric:        Mood and Affect: Mood normal.        Behavior: Behavior normal.        Thought Content: Thought content normal.      UC Treatments / Results  Labs (all labs ordered are listed, but only abnormal results are displayed) Labs Reviewed - No data to display  EKG   Radiology No results found.  Procedures Procedures (including critical care time)  Medications Ordered in UC Medications - No data to display  Initial Impression / Assessment and Plan / UC Course  I have reviewed the triage vital signs and the nursing notes.  Pertinent labs & imaging results that were available during my care of the patient were reviewed by me and considered in my medical decision making (see chart for details).    Blood pressure returns to normal before patient is discharged.  Advised she continue to monitor and follow-up with primary care.  Encouraged evaluation in the emergency room with any worsening symptoms.  Final Clinical Impressions(s) / UC Diagnoses   Final diagnoses:  Essential hypertension   Discharge Instructions   None    ED Prescriptions   None    PDMP not reviewed this encounter.   Tomi Bamberger, PA-C 09/11/22 1614

## 2022-09-11 NOTE — ED Triage Notes (Signed)
"  I was feeling funny this morning, My BP monitor at home wasn't working so I went to [facility] and had it checked and it was 220/110". Current symptoms "my head feels out of sorts with ha's". No dizziness. No Chest Pain. No sob.

## 2022-10-14 ENCOUNTER — Other Ambulatory Visit: Payer: Self-pay | Admitting: Student

## 2022-10-14 DIAGNOSIS — Z1231 Encounter for screening mammogram for malignant neoplasm of breast: Secondary | ICD-10-CM

## 2022-10-30 ENCOUNTER — Encounter (HOSPITAL_COMMUNITY): Payer: Self-pay | Admitting: Emergency Medicine

## 2022-10-30 ENCOUNTER — Ambulatory Visit (HOSPITAL_COMMUNITY)
Admission: EM | Admit: 2022-10-30 | Discharge: 2022-10-30 | Disposition: A | Discharge status: Home / Self Care | Payer: Medicare HMO | Attending: Family Medicine | Admitting: Family Medicine

## 2022-10-30 DIAGNOSIS — S61412A Laceration without foreign body of left hand, initial encounter: Secondary | ICD-10-CM

## 2022-10-30 DIAGNOSIS — S61411A Laceration without foreign body of right hand, initial encounter: Secondary | ICD-10-CM

## 2022-10-30 DIAGNOSIS — Z23 Encounter for immunization: Secondary | ICD-10-CM | POA: Diagnosis not present

## 2022-10-30 DIAGNOSIS — T148XXA Other injury of unspecified body region, initial encounter: Secondary | ICD-10-CM | POA: Diagnosis not present

## 2022-10-30 MED ORDER — ACETAMINOPHEN 325 MG PO TABS
ORAL_TABLET | ORAL | Status: AC
  Filled 2022-10-30: qty 3

## 2022-10-30 MED ORDER — TETANUS-DIPHTH-ACELL PERTUSSIS 5-2.5-18.5 LF-MCG/0.5 IM SUSY
PREFILLED_SYRINGE | INTRAMUSCULAR | Status: AC
  Filled 2022-10-30: qty 0.5

## 2022-10-30 MED ORDER — ACETAMINOPHEN 325 MG PO TABS
975.0000 mg | ORAL_TABLET | Freq: Once | ORAL | Status: AC
  Administered 2022-10-30: 975 mg via ORAL

## 2022-10-30 MED ORDER — AMOXICILLIN-POT CLAVULANATE 875-125 MG PO TABS: 1.0000 | ORAL_TABLET | Freq: Two times a day (BID) | ORAL | 0 refills | Status: AC

## 2022-10-30 MED ORDER — TETANUS-DIPHTH-ACELL PERTUSSIS 5-2.5-18.5 LF-MCG/0.5 IM SUSY
0.5000 mL | PREFILLED_SYRINGE | Freq: Once | INTRAMUSCULAR | Status: AC
  Administered 2022-10-30: 0.5 mL via INTRAMUSCULAR

## 2022-10-30 NOTE — Discharge Instructions (Addendum)
Take amoxicillin-clavulanate 875 mg--1 tab twice daily with food for 7 days  You have been given a Tdap vaccination to boost your tetanus immunity  You can take Tylenol as needed for the pain.  Ice and elevation can help.    1-2 times a day wash the wound with soapy water and then put new triple antibiotic ointment and a new clean bandage on.

## 2022-10-30 NOTE — ED Provider Notes (Signed)
[facility]    CSN: [id number] Arrival date & time: 10/30/22  1143      History   Chief Complaint Chief Complaint  Patient presents with   Animal Bite    HPI Mandy Perry is a 82 y.o. female.    Animal Bite  Here for a cut to her left hand.  This morning she was helping her daughter's 33 year old [place] retriever get up and he bit her on her left hand.  The dog is up-to-date on his vaccinations.  She is allergic to azithromycin statins and tramadol.  She does have diabetes but her sugars have been good.  Her last tetanus according to epic was in 2016.  She does not recall having a tetanus anytime since then Past Medical History:  Diagnosis Date   Aortic atherosclerosis (HCC)    Benign neoplasm of colon    CKD (chronic kidney disease), stage III (HCC)    DDD (degenerative disc disease), lumbar    Diabetes mellitus without complication (HCC)    A1c currently a goal   Essential hypertension, benign    GERD (gastroesophageal reflux disease)    Hyperlipidemia    Followed by PCP   Hypertension    Lumbago    Migraine, unspecified, without mention of intractable migraine without mention of status migrainosus    OAB (overactive bladder)    Osteoarthrosis, unspecified whether generalized or localized, unspecified site    Palpitations    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   Solitary pulmonary nodule 2012   Urinary frequency     Patient Active Problem List   Diagnosis Date Noted   PSVT (paroxysmal supraventricular tachycardia) (HCC) 08/03/2019   DDD (degenerative disc disease), lumbar 10/16/2017   Change in bowel habits 10/16/2017   B12 deficiency 06/08/2017   Primary osteoarthritis of right knee 02/05/2017   Overactive bladder 05/16/2016   Senile osteopenia 05/16/2016   Onychomycosis 05/16/2016   Atrophic vaginitis 01/24/2015   Heart palpitations 10/16/2013   Gastroesophageal reflux disease with esophagitis 07/19/2013   Controlled type 2 diabetes  mellitus with stage 3 chronic kidney disease, without long-term current use of insulin (HCC) 07/19/2013   CKD (chronic kidney disease) stage 3, GFR 30-59 ml/min (HCC) 07/19/2013   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) 07/19/2013   Mixed hyperlipidemia due to type 2 diabetes mellitus (HCC) 07/19/2013   History of PSVT (paroxysmal supraventricular tachycardia) 10/11/2012   Essential hypertension    Diabetes mellitus without complication (HCC)    Hypercholesterolemia with hypertriglyceridemia     Past Surgical History:  Procedure Laterality Date   ABDOMINAL HYSTERECTOMY  1979   CARDIAC CATHETERIZATION  11/15/1994   Patent coronary arteries and normal LV function   CARDIAC CATHETERIZATION  11/03/2003   Normal coronary arteries   CATARACT EXTRACTION, BILATERAL  2001   COLONOSCOPY  2010   Dr.Bucinni, Normal    COLONOSCOPY  2004   Dr.Bucinni   EXERCISE STRESS TEST  11/12/1987   Positive   SHOULDER SURGERY Right 2009   TRANSTHORACIC ECHOCARDIOGRAM  05/19/2010   EF >55%, normal-mild    OB History     Gravida  3   Para  3   Term      Preterm      AB      Living  3      SAB      IAB      Ectopic      Multiple      Live Births  Home Medications    Prior to Admission medications   Medication Sig Start Date End Date Taking? Authorizing Provider  amoxicillin-clavulanate (AUGMENTIN) 875-125 MG tablet Take 1 tablet by mouth 2 (two) times daily for 7 days. 10/30/22 11/06/22 Yes Larayne Baxley, Janace Aris, MD  acetaminophen (TYLENOL) 500 MG tablet Take 500 mg by mouth as needed for pain.    [provider]  albuterol (VENTOLIN HFA) 108 (90 Base) MCG/ACT inhaler Inhale 2 puffs into the lungs every 4 (four) hours as needed for wheezing or shortness of breath. 07/04/22   [provider]  allopurinol (ZYLOPRIM) 100 MG tablet Take 100 mg by mouth daily.    [provider]  amLODipine (NORVASC) 5 MG tablet  TAKE 1 TABLET BY MOUTH EVERYDAY AT BEDTIME 11/06/18   Reed, Tiffany L, DO  aspirin EC 81 MG tablet Take 81 mg by mouth daily.    [provider]  benzonatate (TESSALON) 100 MG capsule Take 1-2 capsules (100-200 mg total) by mouth 3 (three) times daily as needed for cough. Patient not taking: Reported on 10/30/2022 10/05/19   Bing Neighbors, NP  cetirizine (ZYRTEC) 10 MG tablet Take 0.5 tablets (5 mg total) by mouth at bedtime. Patient not taking: Reported on 10/30/2022 10/05/19   Bing Neighbors, NP  Cholecalciferol 125 MCG (5000 UT) capsule Take 5,000 Units by mouth daily. 08/13/15   [provider]  conjugated estrogens (PREMARIN) vaginal cream Pea sized amt PV 2 times per week for atrophic vaginitis 09/26/17   Kirt Boys, DO  [employer] CRANBERRY 500 MG CAPS TAKE 1 CAPSULE BY MOUTH TWICE A DAY 10/25/18   Reed, Tiffany L, DO  docusate sodium (COLACE) 100 MG capsule Take 100 mg by mouth 2 (two) times daily.    [provider]  famotidine (PEPCID) 20 MG tablet Take 20 mg by mouth 2 (two) times daily. Patient not taking: Reported on 10/30/2022 05/17/19   [provider]  fluticasone (FLONASE) 50 MCG/ACT nasal spray Place 2 sprays into both nostrils daily. 03/31/22   [provider]  gabapentin (NEURONTIN) 100 MG capsule Take 100 mg by mouth daily. Patient not taking: Reported on 10/30/2022 06/23/19   [provider]  hydrALAZINE (APRESOLINE) 25 MG tablet Take 25 mg by mouth 2 (two) times daily. 09/11/22   [provider]  hydrochlorothiazide (HYDRODIURIL) 25 MG tablet Take 25 mg by mouth daily. 01/24/15   [provider]  ipratropium (ATROVENT) 0.03 % nasal spray Place 2 sprays into both nostrils 2 (two) times daily. 10/05/19   Bing Neighbors, NP  lansoprazole (PREVACID) 30 MG capsule Take 30 mg by mouth daily at 6 (six) AM. 01/24/15   [provider]  losartan (COZAAR) 50 MG tablet Take 50 mg by mouth 2 (two) times daily.  04/30/19   [provider]  lovastatin (MEVACOR) 20 MG tablet TAKE 1 TABLET BY MOUTH AT BEDTIME 05/06/19   Reed, Tiffany L, DO  memantine (NAMENDA) 10 MG tablet Take 10 mg by mouth daily. 09/08/22   [provider]  metFORMIN (GLUCOPHAGE) 1000 MG tablet TAKE ONE TABLET (1000MG ) BY MOUTH TWICE DAILY WITH MEALS 04/01/19   Reed, Tiffany L, DO  metoprolol succinate (TOPROL-XL) 100 MG 24 hr tablet Take 100 mg by mouth 2 (two) times daily. 05/20/22   [provider]  metoprolol tartrate (LOPRESSOR) 100 MG tablet TAKE 1 TABLET BY MOUTH TWICE A DAY 08/22/19   Marykay Lex, MD  mirabegron ER (MYRBETRIQ) 25 MG TB24 tablet Take  1 tablet (25 mg total) by mouth daily. 01/23/17   Reed, Tiffany L, DO  Multiple Vitamin (MULTIVITAMIN) tablet Take 1 tablet by mouth daily.     [provider]  Omega-3 1000 MG CAPS Take 1 g by mouth.    [provider]  omeprazole (PRILOSEC) 40 MG capsule Take 40 mg by mouth daily. 03/31/22   [provider]  Omeprazole 20 MG TBEC Take 20 mg by mouth daily at 6 (six) AM.    [provider]  Probiotic Product (ALIGN) 4 MG CAPS Take 4 mg by mouth once.    [provider]  RESTORA RX 60-1.25 MG CAPS Take 1 capsule by mouth daily. Patient not taking: Reported on 10/30/2022 06/13/19   [provider]  solifenacin (VESICARE) 5 MG tablet Take 5 mg by mouth daily. 06/05/19   [provider]  tizanidine (ZANAFLEX) 2 MG capsule Take 1 capsule (2 mg total) by mouth at bedtime as needed for muscle spasms. Patient not taking: Reported on 10/30/2022 10/05/19   Bing Neighbors, NP  tolterodine (DETROL LA) 4 MG 24 hr capsule Take 4 mg by mouth daily. 04/29/16   [provider]  vitamin B-12 (CYANOCOBALAMIN) 1000 MCG tablet Take 1,000 mcg by mouth daily.    [provider]    Family History Family History  Problem Relation Age of Onset   CVA Mother    Stroke Mother    Heart attack Father     Cancer Father        Prostate cancer   COPD Brother    Diabetes Sister    Colon cancer Sister    Diabetes Sister    Cancer Brother     Social History Social History   Tobacco Use   Smoking status: Former    Current packs/day: 0.00    Average packs/day: 0.5 packs/day for 20.0 years (10.0 ttl pk-yrs)    Types: Cigarettes    Start date: 01/10/1957    Quit date: 01/10/1977    Years since quitting: 45.8   Smokeless tobacco: Never   Tobacco comments:    Quit at age 82  Vaping Use   Vaping status: Never Used  Substance Use Topics   Alcohol use: Not Currently   Drug use: No     Allergies   Azithromycin, Other, Statins, Tramadol, Tramadol hcl, and Gemfibrozil   Review of Systems Review of Systems   Physical Exam Triage Vital Signs ED Triage Vitals  Encounter Vitals Group     BP 10/30/22 1157 (!) 145/75     Systolic BP Percentile --      Diastolic BP Percentile --      Pulse Rate 10/30/22 1157 77     Resp 10/30/22 1157 16     Temp 10/30/22 1157 98.2 F (36.8 C)     Temp Source 10/30/22 1157 Oral     SpO2 10/30/22 1157 96 %     Weight --      Height --      Head Circumference --      Peak Flow --      Pain Score 10/30/22 1154 6     Pain Loc --      Pain Education --      Exclude from Growth Chart --    No data found.  Updated Vital Signs BP (!) 145/75 (BP Location: Right Arm)   Pulse 77   Temp 98.2 F (36.8 C) (Oral)   Resp 16  SpO2 96%   Visual Acuity Right Eye Distance:   Left Eye Distance:   Bilateral Distance:    Right Eye Near:   Left Eye Near:    Bilateral Near:     Physical Exam Vitals reviewed.  Constitutional:      General: She is not in acute distress.    Appearance: She is not toxic-appearing.  Skin:    Coloration: Skin is not jaundiced or pale.  Neurological:     Mental Status: She is alert and oriented to person, place, and time.  Psychiatric:        Behavior: Behavior normal.      UC Treatments / Results  Labs (all labs  ordered are listed, but only abnormal results are displayed) Labs Reviewed - No data to display  EKG   Radiology No results found.  Procedures Procedures (including critical care time)  Medications Ordered in UC Medications  acetaminophen (TYLENOL) tablet 975 mg (has no administration in time range)  Tdap (BOOSTRIX) injection 0.5 mL (has no administration in time range)    Initial Impression / Assessment and Plan / UC Course  I have reviewed the triage vital signs and the nursing notes.  Pertinent labs & imaging results that were available during my care of the patient were reviewed by me and considered in my medical decision making (see chart for details).      Wound is cleaned with Hibiclens and then flushed with 40 cc of normal saline.  Bandages applied with bacitracin.   wound care is explained.  I did discuss with her that we for the most part do not close wounds that have been incurred by a dog bite.  Tdap is given here  Augmentin is sent in to prevent infection. Final Clinical Impressions(s) / UC Diagnoses   Final diagnoses:  Animal bite  Laceration of left hand without foreign body, initial encounter     Discharge Instructions      Take amoxicillin-clavulanate 875 mg--1 tab twice daily with food for 7 days  You have been given a Tdap vaccination to boost your tetanus immunity  You can take Tylenol as needed for the pain.  Ice and elevation can help.    1-2 times a day wash the wound with soapy water and then put new triple antibiotic ointment and a new clean bandage on.      ED Prescriptions     Medication Sig Dispense Auth. Provider   amoxicillin-clavulanate (AUGMENTIN) 875-125 MG tablet Take 1 tablet by mouth 2 (two) times daily for 7 days. 14 tablet Lorella Gomez, Janace Aris, MD      PDMP not reviewed this encounter.   Zenia Resides, MD 10/30/22 [phone]

## 2022-10-30 NOTE — ED Triage Notes (Signed)
Pt present after daughter dog bite her left hand while trying to help the dog up this morning. Pt states dog is up to date on all shots.

## 2022-11-29 ENCOUNTER — Ambulatory Visit
Admission: RE | Admit: 2022-11-29 | Discharge: 2022-11-29 | Disposition: A | Discharge status: Home / Self Care | Payer: Medicare HMO | Source: Ambulatory Visit | Attending: Student | Admitting: Student

## 2022-11-29 DIAGNOSIS — Z1231 Encounter for screening mammogram for malignant neoplasm of breast: Secondary | ICD-10-CM

## 2023-05-08 ENCOUNTER — Ambulatory Visit (HOSPITAL_COMMUNITY)
Admission: EM | Admit: 2023-05-08 | Discharge: 2023-05-08 | Disposition: A | Discharge status: Home / Self Care | Attending: Family Medicine | Admitting: Family Medicine

## 2023-05-08 ENCOUNTER — Emergency Department (HOSPITAL_COMMUNITY)
Admission: EM | Admit: 2023-05-08 | Discharge: 2023-05-08 | Disposition: A | Discharge status: Home / Self Care | Attending: Emergency Medicine | Admitting: Emergency Medicine

## 2023-05-08 ENCOUNTER — Encounter (HOSPITAL_COMMUNITY): Payer: Self-pay | Admitting: *Deleted

## 2023-05-08 ENCOUNTER — Other Ambulatory Visit: Payer: Self-pay

## 2023-05-08 ENCOUNTER — Emergency Department (HOSPITAL_COMMUNITY)

## 2023-05-08 DIAGNOSIS — R1013 Epigastric pain: Secondary | ICD-10-CM | POA: Diagnosis present

## 2023-05-08 DIAGNOSIS — E119 Type 2 diabetes mellitus without complications: Secondary | ICD-10-CM | POA: Insufficient documentation

## 2023-05-08 DIAGNOSIS — Z7982 Long term (current) use of aspirin: Secondary | ICD-10-CM | POA: Insufficient documentation

## 2023-05-08 DIAGNOSIS — R079 Chest pain, unspecified: Secondary | ICD-10-CM

## 2023-05-08 DIAGNOSIS — Z79899 Other long term (current) drug therapy: Secondary | ICD-10-CM | POA: Diagnosis not present

## 2023-05-08 DIAGNOSIS — I10 Essential (primary) hypertension: Secondary | ICD-10-CM | POA: Insufficient documentation

## 2023-05-08 DIAGNOSIS — Z7984 Long term (current) use of oral hypoglycemic drugs: Secondary | ICD-10-CM | POA: Insufficient documentation

## 2023-05-08 LAB — LIPASE, BLOOD: Lipase: 32 U/L (ref 11–51)

## 2023-05-08 LAB — COMPREHENSIVE METABOLIC PANEL WITH GFR
ALT: 13 U/L (ref 0–44)
AST: 15 U/L (ref 15–41)
Albumin: 3.9 g/dL (ref 3.5–5.0)
Alkaline Phosphatase: 61 U/L (ref 38–126)
Anion gap: 9 (ref 5–15)
BUN: 15 mg/dL (ref 8–23)
CO2: 24 mmol/L (ref 22–32)
Calcium: 9.6 mg/dL (ref 8.9–10.3)
Chloride: 107 mmol/L (ref 98–111)
Creatinine, Ser: 1.21 mg/dL — ABNORMAL HIGH (ref 0.44–1.00)
GFR, Estimated: 45 mL/min — ABNORMAL LOW (ref >60)
Glucose, Bld: 122 mg/dL — ABNORMAL HIGH (ref 70–99)
Potassium: 3.7 mmol/L (ref 3.5–5.1)
Sodium: 140 mmol/L (ref 135–145)
Total Bilirubin: 0.6 mg/dL (ref 0.0–1.2)
Total Protein: 7.2 g/dL (ref 6.5–8.1)

## 2023-05-08 LAB — CBC
HCT: 40.9 % (ref 36.0–46.0)
Hemoglobin: 13.2 g/dL (ref 12.0–15.0)
MCH: 30.5 pg (ref 26.0–34.0)
MCHC: 32.3 g/dL (ref 30.0–36.0)
MCV: 94.5 fL (ref 80.0–100.0)
Platelets: 262 10*3/uL (ref 150–400)
RBC: 4.33 MIL/uL (ref 3.87–5.11)
RDW: 14.3 % (ref 11.5–15.5)
WBC: 4.4 10*3/uL (ref 4.0–10.5)
nRBC: 0 % (ref 0.0–0.2)

## 2023-05-08 LAB — TROPONIN I (HIGH SENSITIVITY): Troponin I (High Sensitivity): 3 ng/L (ref <18)

## 2023-05-08 MED ORDER — FAMOTIDINE 20 MG PO TABS: 20.0000 mg | ORAL_TABLET | Freq: Two times a day (BID) | ORAL | 0 refills | Status: AC

## 2023-05-08 MED ORDER — ALUM & MAG HYDROXIDE-SIMETH 200-200-20 MG/5ML PO SUSP
30.0000 mL | Freq: Once | ORAL | Status: AC
  Administered 2023-05-08: 30 mL via ORAL
  Filled 2023-05-08: qty 30

## 2023-05-08 NOTE — ED Provider Notes (Signed)
 [place] [facility] Provider Note   CSN: [id number] Arrival date & time: 05/08/23  4098     History  Chief Complaint  Patient presents with   Chest Pain    Mandy Perry is a 83 y.o. female.  Acid pain Shoots through her Goes to her side and then ends Every couple hours Started yesterday early in the morning Has had it in the past Usually can burp and it goes away. Now she can Antacidic helped  Not sure about medications, doesn't take h2 blockers  No SOB Mostly heart Occasional lightheadedness No passing out Leg swelling  No history of HF, No MI HTN    This is a very pleasant 83 year old female with pertinent past medical history of hypertension, hyperlipidemia, diabetes presenting with multiday history of epigastric abdominal/lower chest pain.  She states yesterday morning she began having episodes of epigastric abdominal pain.  She says she took antacids which helped a little bit, and feels like she has some gas.  Usually when she has this type of pain, she can belch and it will go away.  Per patient, she has been unable to belch to help relieve this pain.  She has had this kind of pain in the past, but has never had the severity of pain.  She is prescribed to take proton pump inhibitors, but states she has not been taking this.  She does state that she previously took PPIs, but stopped taking them due to pill burden.  She denies shortness of breath at rest but does say she occasionally gets short of breath with walking.  She does have lower extremity edema in the mornings but says this typically resolves throughout the day.  She has no history of heart failure or myocardial infarction.        Home Medications Prior to Admission medications   Medication Sig Start Date End Date Taking? Authorizing Provider  acetaminophen  (TYLENOL ) 500 MG tablet Take 500 mg by mouth as needed for pain.    [provider]  albuterol  (VENTOLIN HFA) 108 (90 Base) MCG/ACT inhaler Inhale 2 puffs into the lungs every 4 (four) hours as needed for wheezing or shortness of breath. 07/04/22   [provider]  allopurinol (ZYLOPRIM) 100 MG tablet Take 100 mg by mouth daily.    [provider]  amLODipine  (NORVASC ) 5 MG tablet TAKE 1 TABLET BY MOUTH EVERYDAY AT BEDTIME 11/06/18   Reed, Tiffany L, DO  aspirin EC 81 MG tablet Take 81 mg by mouth daily.    [provider]  benzonatate  (TESSALON ) 100 MG capsule Take 1-2 capsules (100-200 mg total) by mouth 3 (three) times daily as needed for cough. 10/05/19   Buena Carmine, NP  cetirizine  (ZYRTEC ) 10 MG tablet Take 0.5 tablets (5 mg total) by mouth at bedtime. 10/05/19   Buena Carmine, NP  Cholecalciferol 125 MCG (5000 UT) capsule Take 5,000 Units by mouth daily. 08/13/15   [provider]  conjugated estrogens  (PREMARIN ) vaginal cream Pea sized amt PV 2 times per week for atrophic vaginitis 09/26/17   Laird Pih, DO  [employer] CRANBERRY 500 MG CAPS TAKE 1 CAPSULE BY MOUTH TWICE A DAY 10/25/18   Reed, Tiffany L, DO  docusate sodium (COLACE) 100 MG capsule Take 100 mg by mouth 2 (two) times daily.    [provider]  famotidine (PEPCID) 20 MG tablet Take 1 tablet (20 mg total) by mouth 2 (two) times daily. 05/08/23  Jawann Urbani, MD  fluticasone (FLONASE) 50 MCG/ACT nasal spray Place 2 sprays into both nostrils daily. 03/31/22   [provider]  gabapentin (NEURONTIN) 100 MG capsule Take 100 mg by mouth daily. Patient not taking: Reported on 10/30/2022 06/23/19   [provider]  hydrALAZINE (APRESOLINE) 25 MG tablet Take 25 mg by mouth 2 (two) times daily. 09/11/22   [provider]  hydrochlorothiazide  (HYDRODIURIL ) 25 MG tablet Take 25 mg by mouth daily. 01/24/15   [provider]  ipratropium (ATROVENT ) 0.03 % nasal spray Place 2 sprays into both nostrils 2 (two) times daily. 10/05/19   Buena Carmine, NP   lansoprazole (PREVACID) 30 MG capsule Take 30 mg by mouth daily at 6 (six) AM. 01/24/15   [provider]  losartan  (COZAAR ) 50 MG tablet Take 50 mg by mouth 2 (two) times daily. 04/30/19   [provider]  lovastatin  (MEVACOR ) 20 MG tablet TAKE 1 TABLET BY MOUTH AT BEDTIME 05/06/19   Reed, Tiffany L, DO  memantine (NAMENDA) 10 MG tablet Take 10 mg by mouth daily. 09/08/22   [provider]  metFORMIN  (GLUCOPHAGE ) 1000 MG tablet TAKE ONE TABLET (1000MG ) BY MOUTH TWICE DAILY WITH MEALS 04/01/19   Reed, Tiffany L, DO  metoprolol  succinate (TOPROL -XL) 100 MG 24 hr tablet Take 100 mg by mouth 2 (two) times daily. 05/20/22   [provider]  metoprolol  tartrate (LOPRESSOR ) 100 MG tablet TAKE 1 TABLET BY MOUTH TWICE A DAY 08/22/19   Arleen Lacer, MD  mirabegron  ER (MYRBETRIQ ) 25 MG TB24 tablet Take 1 tablet (25 mg total) by mouth daily. 01/23/17   Reed, Tiffany L, DO  Multiple Vitamin (MULTIVITAMIN) tablet Take 1 tablet by mouth daily.     [provider]  Omega-3 1000 MG CAPS Take 1 g by mouth.    [provider]  omeprazole (PRILOSEC) 40 MG capsule Take 40 mg by mouth daily. 03/31/22   [provider]  Omeprazole 20 MG TBEC Take 20 mg by mouth daily at 6 (six) AM.    [provider]  Probiotic Product (ALIGN) 4 MG CAPS Take 4 mg by mouth once.    [provider]  RESTORA RX 60-1.25 MG CAPS Take 1 capsule by mouth daily. 06/13/19   [provider]  solifenacin (VESICARE) 5 MG tablet Take 5 mg by mouth daily. 06/05/19   [provider]  tizanidine  (ZANAFLEX ) 2 MG capsule Take 1 capsule (2 mg total) by mouth at bedtime as needed for muscle spasms. 10/05/19   Buena Carmine, NP  tolterodine  (DETROL  LA) 4 MG 24 hr capsule Take 4 mg by mouth daily. 04/29/16   [provider]  vitamin B-12 (CYANOCOBALAMIN) 1000 MCG tablet Take 1,000 mcg by mouth daily.    [provider]      Allergies     Azithromycin, Other, Statins, Tramadol, Tramadol hcl, and Gemfibrozil     Review of Systems   Review of Systems  Constitutional:  Negative for chills and fever.  Respiratory:  Negative for cough and shortness of breath.   Cardiovascular:  Positive for chest pain and leg swelling. Negative for palpitations.       Epigastric/lower chest pain.  Denies substernal pain, denies radiation to the shoulder or jaw.  Gastrointestinal:  Positive for abdominal pain. Negative for blood in stool, constipation, diarrhea, nausea and vomiting.    Physical Exam Updated Vital Signs BP (!) 151/73 (BP Location: Right Arm)   Pulse 71  Temp 98.3 F (36.8 C) (Oral)   Resp 17   Ht 5\' 5"  (1.651 m)   Wt 62.6 kg   SpO2 98%   BMI 22.96 kg/m  Physical Exam Constitutional:      General: She is not in acute distress.    Appearance: Normal appearance.  Cardiovascular:     Rate and Rhythm: Normal rate and regular rhythm.     Heart sounds: No murmur heard. Pulmonary:     Effort: Pulmonary effort is normal.     Breath sounds: Normal breath sounds.  Abdominal:     General: Abdomen is flat.     Tenderness: There is abdominal tenderness.     Comments: Epigastric tenderness to palpation, no rebound, no guarding, no signs of peritonitis.  Skin:    General: Skin is warm and dry.  Neurological:     Mental Status: She is alert.     ED Results / Procedures / Treatments   Labs (all labs ordered are listed, but only abnormal results are displayed) Labs Reviewed  COMPREHENSIVE METABOLIC PANEL WITH GFR - Abnormal; Notable for the following components:      Result Value   Glucose, Bld 122 (*)    Creatinine, Ser 1.21 (*)    GFR, Estimated 45 (*)    All other components within normal limits  CBC  LIPASE, BLOOD  TROPONIN I (HIGH SENSITIVITY)    EKG EKG Interpretation Date/Time:  Monday May 08 2023 09:32:08 EDT Ventricular Rate:  71 PR Interval:  48 QRS Duration:  89 QT Interval:  400 QTC  Calculation: 435 R Axis:   73  Text Interpretation: Sinus rhythm Paired ventricular premature complexes Short PR interval Probable left atrial enlargement Borderline repolarization abnormality Baseline wander Otherwise no significant change Confirmed by Albertus Hughs [phone]) on 05/08/2023 10:05:58 AM  Radiology DG Chest Portable 1 View Result Date: 05/08/2023 CLINICAL DATA:  Chest and epigastric pain. EXAM: PORTABLE CHEST 1 VIEW COMPARISON:  03/31/2022.  CTA of the chest on 10/31/2017. FINDINGS: The heart size is within normal limits. Stable nodular opacity in the left upper lobe which has been previously demonstrated to represent a discrete 13 mm pulmonary nodule by CT. Stable prominence in the right hilum again likely related to chronic prominence and tortuosity of the right pulmonary artery. There is no evidence of pulmonary edema, consolidation, pneumothorax or pleural fluid. The visualized skeletal structures are unremarkable. IMPRESSION: 1. No acute findings. 2. Stable nodular opacity in the left upper lobe which has been previously demonstrated to represent a discrete 13 mm pulmonary nodule by CT. 3. Stable prominence in the right hilum again likely related to chronic prominence and tortuosity of the right pulmonary artery. Electronically Signed   By: Erica Hau M.D.   On: 05/08/2023 11:37    Procedures Procedures    Medications Ordered in ED Medications  alum & mag hydroxide-simeth (MAALOX/MYLANTA) 200-200-20 MG/5ML suspension 30 mL (30 mLs Oral Given 05/08/23 1148)    ED Course/ Medical Decision Making/ A&P                                 Medical Decision Making Amount and/or Complexity of Data Reviewed Labs: ordered. Radiology: ordered.  Risk OTC drugs.   In brief this is a 83 year old female with cardiac risk factors presenting with epigastric/lower chest pain.  Her pain appears non-anginal and EKG was relatively unremarkable.  She does have significant risk factors for  coronary  artery disease given her hypertension, hyperlipidemia, and diabetes.  Will obtain single troponin given her pain has been ongoing for some days now to further rule out coronary artery disease.  Will obtain lipase to rule out pancreatitis, CMP for biliary pathology, electrolyte dysfunction, CBC to assess blood counts and setting of possible peptic ulcer disease.  Chest x-ray ordered to rule out acute intrathoracic pathology.  Given GI cocktail to see if this helps improve her symptoms.  Chest x-ray personally reviewed, no acute changes.  Troponins negative.  Lipase within normal limits.  CMP demonstrating creatinine similar to prior.  No signs of biliary involvement, LFTs normal.  CBC within normal limits, no anemia to suggest bleeding ulcer.  Overall, workup very reassuring.  Most likely diagnosis GERD, gastritis, or peptic ulcer disease.  Patient to follow-up with primary care doctor for further evaluation, will send home with famotidine in the interim.         Final Clinical Impression(s) / ED Diagnoses Final diagnoses:  Epigastric pain    Rx / DC Orders ED Discharge Orders          Ordered    famotidine (PEPCID) 20 MG tablet  2 times daily        05/08/23 1208              Sheree Dieter, MD 05/08/23 1524    Albertus Hughs, DO 05/09/23 [phone]

## 2023-05-08 NOTE — ED Notes (Signed)
 Phlebotomy to grab labs at this time.

## 2023-05-08 NOTE — ED Triage Notes (Signed)
 PT reports sternal CP that shoots to her back since yesterday. Pt did not take any AM meds today.

## 2023-05-08 NOTE — Discharge Instructions (Addendum)
 You are seen today for upper belly/lower chest pain.  Your lab tests and imaging studies did not show any pneumonia or signs of heart problems.  It is most likely that your pain is coming from your stomach/abdomen.  I will give you a prescription for medicine to help with your stomach acid, you should follow with your primary doctor closely for additional workup and testing.  Thank you for allowing us  to be part of your care.

## 2023-05-08 NOTE — ED Notes (Signed)
 Discharge paper work reviewed with pt. No questions at this time. Pt left in no new onset distress at this time.

## 2023-05-08 NOTE — ED Provider Notes (Signed)
 [facility]    CSN: [id number] Arrival date & time: 05/08/23  0806      History   Chief Complaint Chief Complaint  Patient presents with   Chest Pain    HPI Mandy Perry is a 83 y.o. female.    Chest Pain Associated symptoms: dizziness    Patient is here for chest pain that started yesterday evening.  She states this is in the middle of her chest, and radiates around to the left upper abdomen and into the back.   No n/v.  No radiation of pain to the neck or arm otherwise.  Some dizziness at times.  She states this feels like gas.  She did burp once, but that did not relieve her pain.  She has  h/o DM, HTN, CKD.        Past Medical History:  Diagnosis Date   Aortic atherosclerosis (HCC)    Benign neoplasm of colon    CKD (chronic kidney disease), stage III (HCC)    DDD (degenerative disc disease), lumbar    Diabetes mellitus without complication (HCC)    A1c currently a goal   Essential hypertension, benign    GERD (gastroesophageal reflux disease)    Hyperlipidemia    Followed by PCP   Hypertension    Lumbago    Migraine, unspecified, without mention of intractable migraine without mention of status migrainosus    OAB (overactive bladder)    Osteoarthrosis, unspecified whether generalized or localized, unspecified site    Palpitations    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   Solitary pulmonary nodule 2012   Urinary frequency     Patient Active Problem List   Diagnosis Date Noted   PSVT (paroxysmal supraventricular tachycardia) (HCC) 08/03/2019   DDD (degenerative disc disease), lumbar 10/16/2017   Change in bowel habits 10/16/2017   B12 deficiency 06/08/2017   Primary osteoarthritis of right knee 02/05/2017   Overactive bladder 05/16/2016   Senile osteopenia 05/16/2016   Onychomycosis 05/16/2016   Atrophic vaginitis 01/24/2015   Heart palpitations 10/16/2013   Gastroesophageal reflux disease with esophagitis 07/19/2013   Controlled type 2  diabetes mellitus with stage 3 chronic kidney disease, without long-term current use of insulin (HCC) 07/19/2013   CKD (chronic kidney disease) stage 3, GFR 30-59 ml/min (HCC) 07/19/2013   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) 07/19/2013   Mixed hyperlipidemia due to type 2 diabetes mellitus (HCC) 07/19/2013   History of PSVT (paroxysmal supraventricular tachycardia) 10/11/2012   Essential hypertension    Diabetes mellitus without complication (HCC)    Hypercholesterolemia with hypertriglyceridemia     Past Surgical History:  Procedure Laterality Date   ABDOMINAL HYSTERECTOMY  1979   CARDIAC CATHETERIZATION  11/15/1994   Patent coronary arteries and normal LV function   CARDIAC CATHETERIZATION  11/03/2003   Normal coronary arteries   CATARACT EXTRACTION, BILATERAL  2001   COLONOSCOPY  2010   Dr.Bucinni, Normal    COLONOSCOPY  2004   Dr.Bucinni   EXERCISE STRESS TEST  11/12/1987   Positive   SHOULDER SURGERY Right 2009   TRANSTHORACIC ECHOCARDIOGRAM  05/19/2010   EF >55%, normal-mild    OB History     Gravida  3   Para  3   Term      Preterm      AB      Living  3      SAB      IAB      Ectopic  Multiple      Live Births               Home Medications    Prior to Admission medications   Medication Sig Start Date End Date Taking? Authorizing Provider  acetaminophen  (TYLENOL ) 500 MG tablet Take 500 mg by mouth as needed for pain.   Yes [provider]  albuterol (VENTOLIN HFA) 108 (90 Base) MCG/ACT inhaler Inhale 2 puffs into the lungs every 4 (four) hours as needed for wheezing or shortness of breath. 07/04/22  Yes [provider]  allopurinol (ZYLOPRIM) 100 MG tablet Take 100 mg by mouth daily.   Yes [provider]  amLODipine  (NORVASC ) 5 MG tablet TAKE 1 TABLET BY MOUTH EVERYDAY AT BEDTIME 11/06/18  Yes Reed, Tiffany L, DO  aspirin EC 81 MG tablet Take 81 mg by mouth daily.   Yes  [provider]  benzonatate  (TESSALON ) 100 MG capsule Take 1-2 capsules (100-200 mg total) by mouth 3 (three) times daily as needed for cough. 10/05/19  Yes Buena Carmine, NP  cetirizine  (ZYRTEC ) 10 MG tablet Take 0.5 tablets (5 mg total) by mouth at bedtime. 10/05/19  Yes Buena Carmine, NP  Cholecalciferol 125 MCG (5000 UT) capsule Take 5,000 Units by mouth daily. 08/13/15  Yes [provider]  conjugated estrogens  (PREMARIN ) vaginal cream Pea sized amt PV 2 times per week for atrophic vaginitis 09/26/17  Yes Sarpy Hammans, Monica, DO  [employer] CRANBERRY 500 MG CAPS TAKE 1 CAPSULE BY MOUTH TWICE A DAY 10/25/18  Yes Reed, Tiffany L, DO  docusate sodium (COLACE) 100 MG capsule Take 100 mg by mouth 2 (two) times daily.   Yes [provider]  hydrALAZINE (APRESOLINE) 25 MG tablet Take 25 mg by mouth 2 (two) times daily. 09/11/22  Yes [provider]  hydrochlorothiazide  (HYDRODIURIL ) 25 MG tablet Take 25 mg by mouth daily. 01/24/15  Yes [provider]  ipratropium (ATROVENT ) 0.03 % nasal spray Place 2 sprays into both nostrils 2 (two) times daily. 10/05/19  Yes Buena Carmine, NP  lansoprazole (PREVACID) 30 MG capsule Take 30 mg by mouth daily at 6 (six) AM. 01/24/15  Yes [provider]  losartan  (COZAAR ) 50 MG tablet Take 50 mg by mouth 2 (two) times daily. 04/30/19  Yes [provider]  lovastatin  (MEVACOR ) 20 MG tablet TAKE 1 TABLET BY MOUTH AT BEDTIME 05/06/19  Yes Reed, Tiffany L, DO  memantine (NAMENDA) 10 MG tablet Take 10 mg by mouth daily. 09/08/22  Yes [provider]  metFORMIN  (GLUCOPHAGE ) 1000 MG tablet TAKE ONE TABLET (1000MG ) BY MOUTH TWICE DAILY WITH MEALS 04/01/19  Yes Reed, Tiffany L, DO  metoprolol  succinate (TOPROL -XL) 100 MG 24 hr tablet Take 100 mg by mouth 2 (two) times daily. 05/20/22  Yes [provider]  metoprolol  tartrate (LOPRESSOR ) 100 MG tablet TAKE 1 TABLET BY MOUTH TWICE A DAY 08/22/19  Yes Arleen Lacer, MD  mirabegron  ER (MYRBETRIQ ) 25 MG TB24 tablet Take 1 tablet (25 mg total) by mouth daily. 01/23/17  Yes Reed, Tiffany L, DO  Multiple Vitamin (MULTIVITAMIN) tablet Take 1 tablet by mouth daily.    Yes [provider]  Omega-3 1000 MG CAPS Take 1 g by mouth.   Yes [provider]  omeprazole (PRILOSEC) 40 MG capsule Take 40 mg by mouth daily. 03/31/22  Yes [provider]  Omeprazole 20 MG TBEC Take 20 mg by mouth daily at 6 (six) AM.   Yes [provider]  Probiotic Product (ALIGN) 4 MG CAPS Take 4 mg by mouth once.   Yes [provider]  RESTORA RX 60-1.25 MG CAPS Take 1 capsule by mouth daily. 06/13/19  Yes [provider]  solifenacin (VESICARE) 5 MG tablet Take 5 mg by mouth daily. 06/05/19  Yes [provider]  tizanidine  (ZANAFLEX ) 2 MG capsule Take 1 capsule (2 mg total) by mouth at bedtime as needed for muscle spasms. 10/05/19  Yes Buena Carmine, NP  tolterodine  (DETROL  LA) 4 MG 24 hr capsule Take 4 mg by mouth daily. 04/29/16  Yes [provider]  vitamin B-12 (CYANOCOBALAMIN) 1000 MCG tablet Take 1,000 mcg by mouth daily.   Yes [provider]  famotidine (PEPCID) 20 MG tablet Take 20 mg by mouth 2 (two) times daily. Patient not taking: Reported on 10/30/2022 05/17/19   [provider]  fluticasone (FLONASE) 50 MCG/ACT nasal spray Place 2 sprays into both nostrils daily. 03/31/22   [provider]  gabapentin (NEURONTIN) 100 MG capsule Take 100 mg by mouth daily. Patient not taking: Reported on 10/30/2022 06/23/19   [provider]    Family History Family History  Problem Relation Age of Onset   CVA Mother    Stroke Mother    Heart attack Father    Cancer Father        Prostate cancer   COPD Brother    Diabetes Sister    Colon cancer Sister    Diabetes Sister    Cancer Brother     Social History Social History   Tobacco Use   Smoking status: Former     Current packs/day: 0.00    Average packs/day: 0.5 packs/day for 20.0 years (10.0 ttl pk-yrs)    Types: Cigarettes    Start date: 01/10/1957    Quit date: 01/10/1977    Years since quitting: 46.3   Smokeless tobacco: Never   Tobacco comments:    Quit at age 88  Vaping Use   Vaping status: Never Used  Substance Use Topics   Alcohol use: Not Currently   Drug use: No     Allergies   Azithromycin, Other, Statins, Tramadol, Tramadol hcl, and Gemfibrozil    Review of Systems Review of Systems  Constitutional: Negative.   HENT: Negative.    Respiratory: Negative.    Cardiovascular:  Positive for chest pain.  Gastrointestinal: Negative.   Genitourinary: Negative.   Musculoskeletal: Negative.   Neurological:  Positive for dizziness.  Psychiatric/Behavioral: Negative.       Physical Exam Triage Vital Signs ED Triage Vitals  Encounter Vitals Group     BP 05/08/23 0842 (!) 149/65     Systolic BP Percentile --      Diastolic BP Percentile --      Pulse Rate 05/08/23 0842 66     Resp 05/08/23 0842 18     Temp 05/08/23 0842 98.1 F (36.7 C)     Temp src --      SpO2 05/08/23 0842 98 %     Weight --      Height --      Head Circumference --      Peak Flow --      Pain Score 05/08/23 0839 5     Pain Loc --      Pain Education --      Exclude from Growth Chart --    No data found.  Updated Vital Signs BP (!) 149/65   Pulse 66  Temp 98.1 F (36.7 C)   Resp 18   SpO2 98%   Visual Acuity Right Eye Distance:   Left Eye Distance:   Bilateral Distance:    Right Eye Near:   Left Eye Near:    Bilateral Near:     Physical Exam Constitutional:      General: She is not in acute distress.    Appearance: She is well-developed and normal weight. She is not ill-appearing or toxic-appearing.  Cardiovascular:     Rate and Rhythm: Normal rate and regular rhythm.     Heart sounds: Normal heart sounds.  Pulmonary:     Effort: Pulmonary effort is normal.  Abdominal:      Palpations: Abdomen is soft.     Comments: +TTP to the epigastric and LUQ;  no guarding or rebound  Musculoskeletal:     Cervical back: Normal range of motion.  Neurological:     Mental Status: She is alert.      UC Treatments / Results  Labs (all labs ordered are listed, but only abnormal results are displayed) Labs Reviewed - No data to display  EKG NSR;  normal EKG  Radiology No results found.  Procedures Procedures (including critical care time)  Medications Ordered in UC Medications - No data to display  Initial Impression / Assessment and Plan / UC Course  I have reviewed the triage vital signs and the nursing notes.  Pertinent labs & imaging results that were available during my care of the patient were reviewed by me and considered in my medical decision making (see chart for details).   Final Clinical Impressions(s) / UC Diagnoses   Final diagnoses:  Chest pain, unspecified type     Discharge Instructions      You were seen today for chest pain.  We do not have the necessary testing here to make sure this is not your heart.  You need to go to the ER for further evaluation.     ED Prescriptions   None    PDMP not reviewed this encounter.   Lesle Ras, MD 05/08/23 [phone]

## 2023-05-08 NOTE — ED Triage Notes (Addendum)
 Pt went to urgent care due to chest pain radiating from chest to ribs and back.   Pt felt she had gas and took "chewable tablets" she could not think of the name, tried to burp before feeling some relief then went to urgent care.   At urgent care pt was told that she needed more test and to go straight to the ED.

## 2023-05-08 NOTE — ED Notes (Signed)
 DR Piontek at bed side to eval Pt.

## 2023-05-08 NOTE — Discharge Instructions (Signed)
 You were seen today for chest pain.  We do not have the necessary testing here to make sure this is not your heart.  You need to go to the ER for further evaluation.

## 2023-05-08 NOTE — ED Notes (Signed)
 PT verbalizes understanding of importance of going to ED for further eval of CP.

## 2023-05-08 NOTE — ED Triage Notes (Signed)
 PT DOB and Full name confirmed before triage

## 2023-08-08 IMAGING — MG MM DIGITAL SCREENING BILAT W/ TOMO AND CAD
8 series · 9 of 24 positions shown · non-contrast
Comparison: None.

CLINICAL DATA: Screening.

EXAM:
DIGITAL SCREENING BILATERAL MAMMOGRAM WITH TOMOSYNTHESIS AND CAD
TECHNIQUE: Bilateral screening digital craniocaudal and mediolateral oblique
mammograms were obtained. Bilateral screening digital breast
tomosynthesis was performed. The images were evaluated with
computer-aided detection.

[R MLO synth-2D]
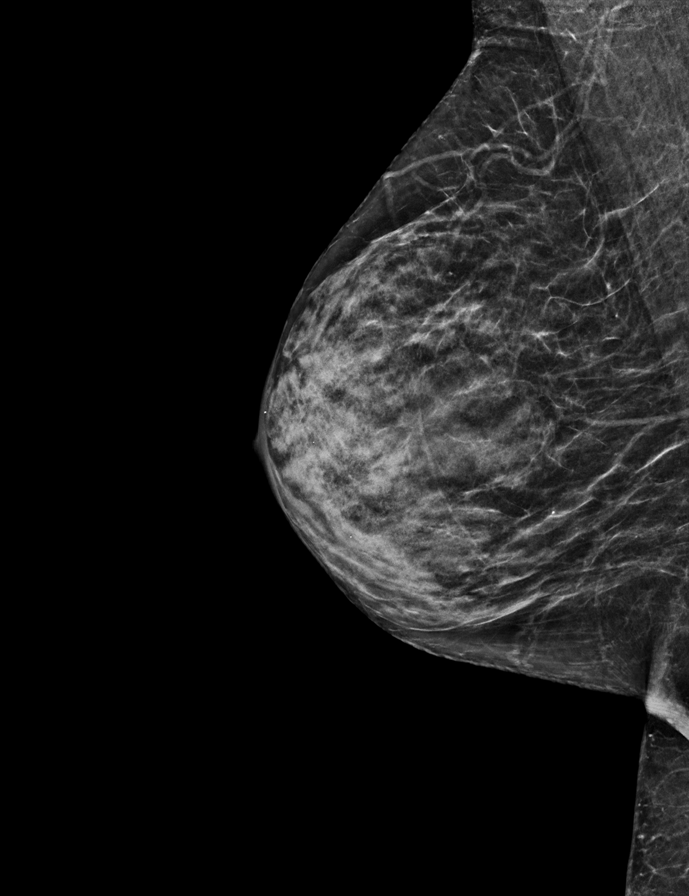

[R CC synth-2D]
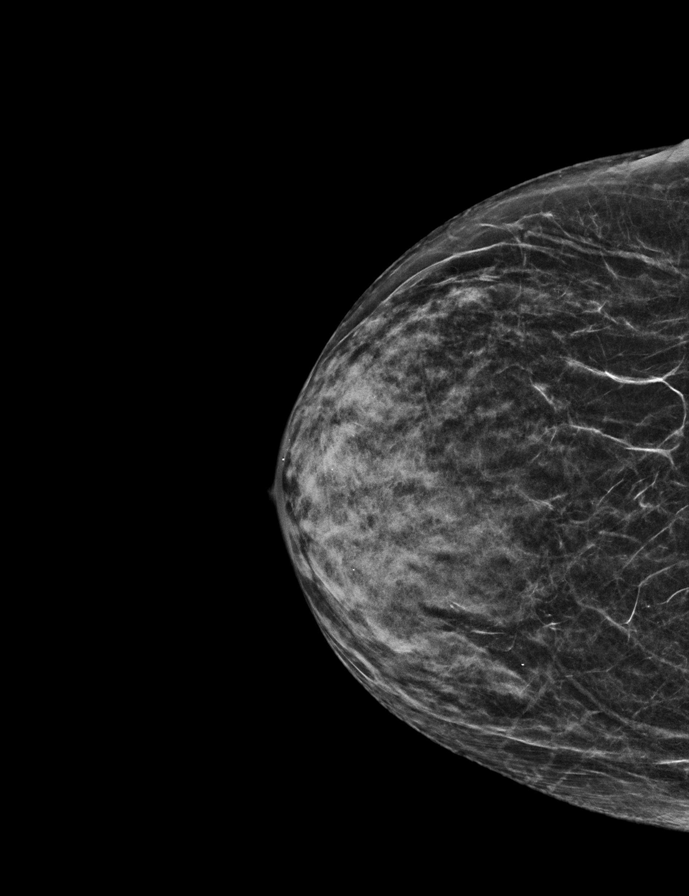

[L MLO synth-2D]
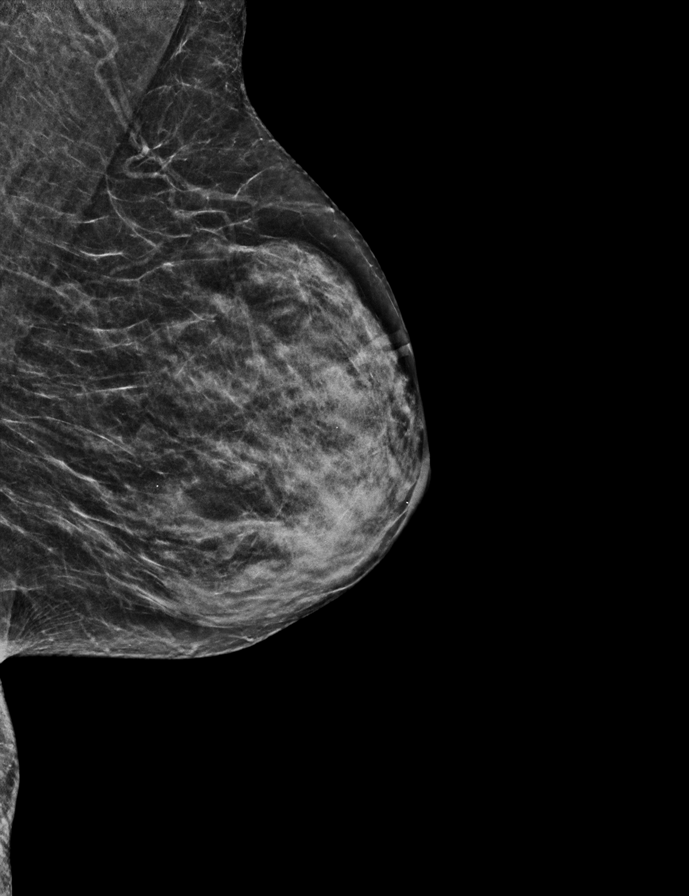

[L CC synth-2D]
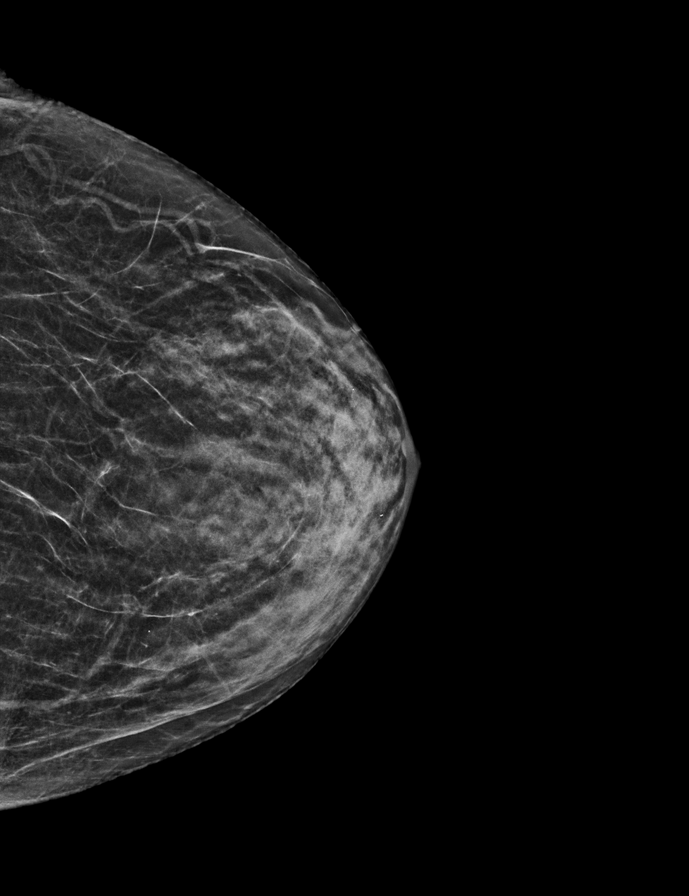

[L MLO tomo · 2 of 49 frames shown]
[frame 16/49]
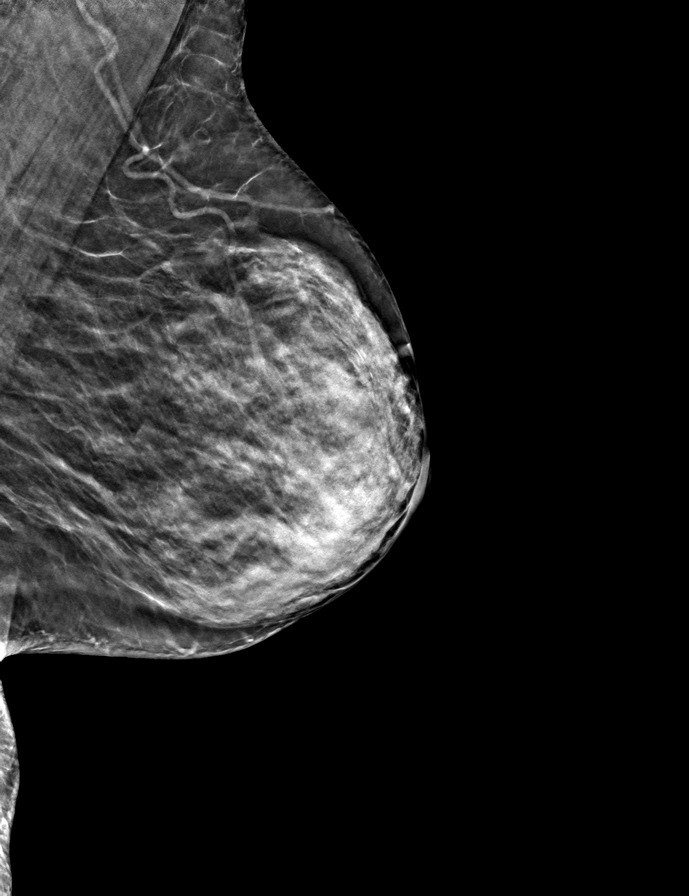
[frame 25/49]
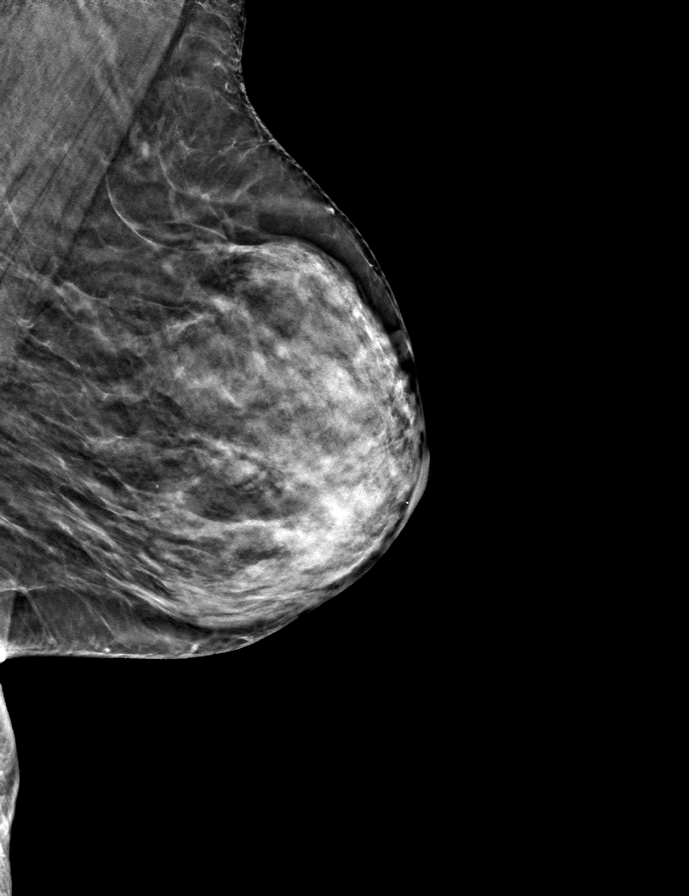

[L CC tomo · tomo slice 23/46.0]
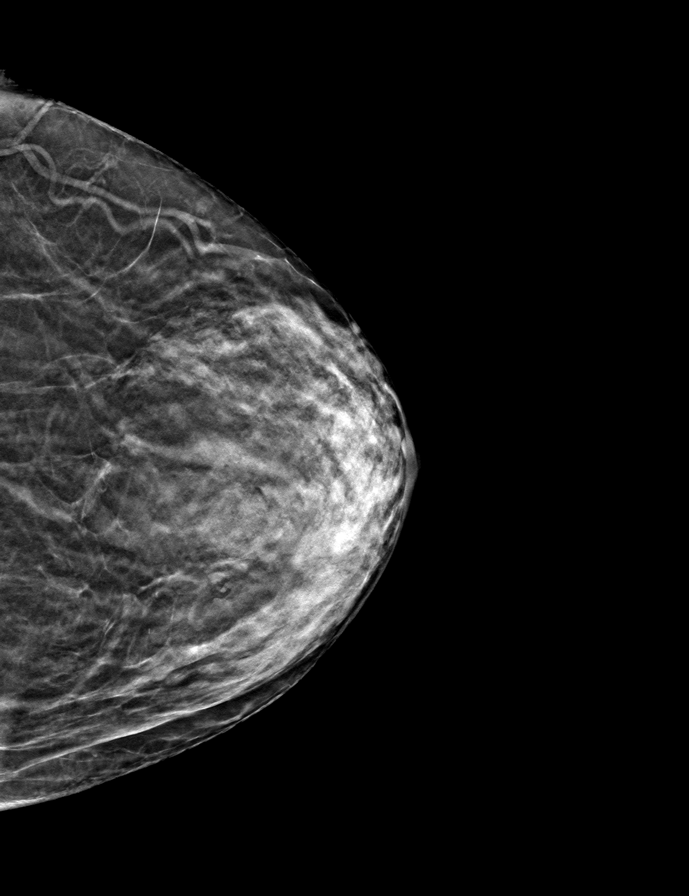

[R MLO tomo · tomo slice 25/48.0]
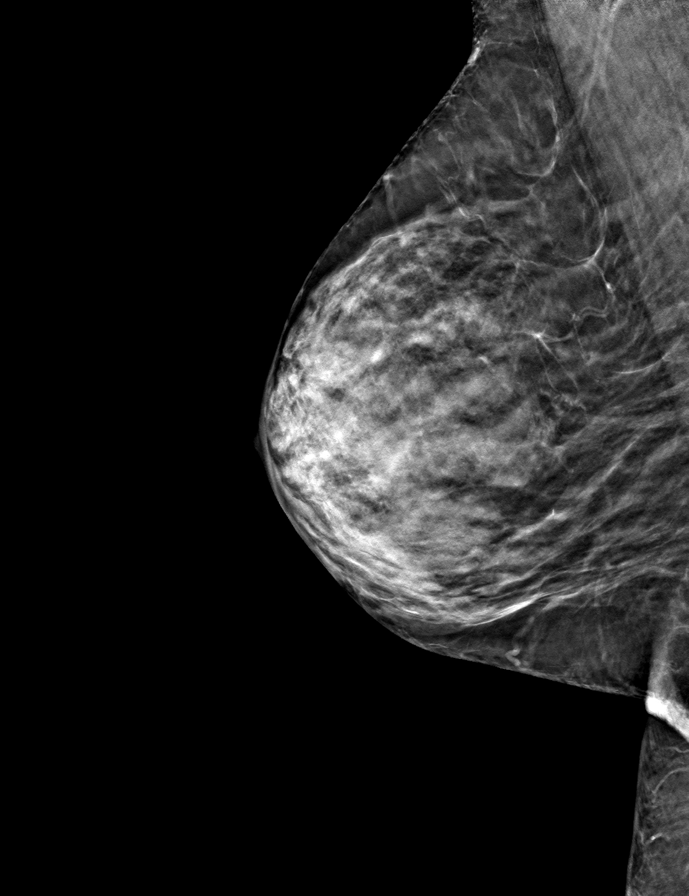

[R CC tomo · tomo slice 24/47.0]
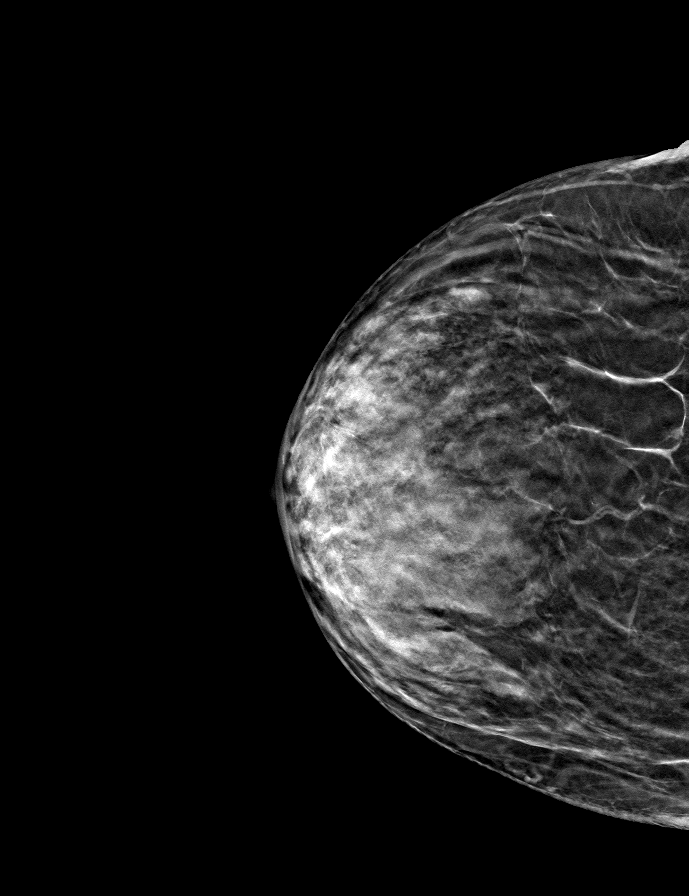

[9 of 24 positions shown; findings below may reference images not displayed]

ACR Breast Density Category c: The breast tissue is heterogeneously
dense, which may obscure small masses
FINDINGS: There are no findings suspicious for malignancy.
IMPRESSION: No mammographic evidence of malignancy. A result letter of this
screening mammogram will be mailed directly to the patient.

RECOMMENDATION:
Screening mammogram in one year. (Code:C8-T-HNK)

BI-RADS CATEGORY  1: Negative.

## 2023-10-09 ENCOUNTER — Ambulatory Visit: Admitting: Diagnostic Neuroimaging

## 2023-10-09 ENCOUNTER — Encounter: Payer: Self-pay | Admitting: Diagnostic Neuroimaging

## 2023-10-09 VITALS — BP 123/71 | HR 65 | Ht 65.0 in | Wt 134.0 lb

## 2023-10-09 DIAGNOSIS — R413 Other amnesia: Secondary | ICD-10-CM

## 2023-10-09 NOTE — Progress Notes (Signed)
 [facility]  PATIENT: Mandy Perry DOB: [birth year]  REFERRING CLINICIAN: Campbell Reynolds, NP HISTORY FROM: PATIENT  REASON FOR VISIT: NEW CONSULT    HISTORICAL  CHIEF COMPLAINT:  Chief Complaint  Patient presents with   Memory Loss    Rm 6 alone  Pt is well, here for declining memory.      HISTORY OF PRESENT ILLNESS:   UPDATE (10/09/23, VRP): Since last visit, mild memory loss continues. No major changes in ADLs. Sleep is fair. Some joint pain. Some mild anxiety. Still active at home. Has a roommate. Walks her dog. Does word search.  PRIOR HPI (02/03/20, VRP): 83 year old female here for evaluation of memory loss.  Patient has noted 6 to 12 months of mild word recall, difficulty with driving directions and short-term memory loss.  Patient lives alone.  She takes care of all of her ADLs.  Her daughter helps her with some issues but patient is essentially independent.  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   REVIEW OF SYSTEMS: Full 14 system review of systems performed and negative with exception of: As per HPI.  ALLERGIES: Allergies  Allergen Reactions   Azithromycin Hives    Other Reaction(s): hives   Other Hives    Z pack  Other Reaction(s): hives   Statins     Myalgias-high doses  Other Reaction(s): myalgia   Tramadol Nausea Only   Tramadol Hcl Nausea Only   Gemfibrozil  Other (See Comments)    Leg cramps at night  Other Reaction(s): leg cramps    HOME MEDICATIONS: Outpatient Medications Prior to Visit  Medication Sig Dispense Refill   acetaminophen  (TYLENOL ) 500 MG tablet Take 500 mg by mouth as needed for pain.     albuterol (VENTOLIN HFA) 108 (90 Base) MCG/ACT inhaler Inhale 2 puffs into the lungs every 4 (four) hours as needed for wheezing or shortness of breath.     allopurinol (ZYLOPRIM) 100 MG tablet Take 100 mg by mouth daily.     amLODipine   (NORVASC ) 5 MG tablet TAKE 1 TABLET BY MOUTH EVERYDAY AT BEDTIME 30 tablet 5   aspirin EC 81 MG tablet Take 81 mg by mouth daily.     cetirizine  (ZYRTEC ) 10 MG tablet Take 0.5 tablets (5 mg total) by mouth at bedtime. 30 tablet 0   famotidine  (PEPCID ) 20 MG tablet Take 1 tablet (20 mg total) by mouth 2 (two) times daily. 30 tablet 0   fluticasone (FLONASE) 50 MCG/ACT nasal spray Place 2 sprays into both nostrils daily.     hydrALAZINE (APRESOLINE) 25 MG tablet Take 25 mg by mouth 2 (two) times daily.     hydrochlorothiazide  (HYDRODIURIL ) 25 MG tablet Take 25 mg by mouth daily.     ipratropium (ATROVENT ) 0.03 % nasal spray Place 2 sprays into both nostrils 2 (two) times daily. 30 mL 0   lansoprazole (PREVACID) 30 MG capsule Take 30 mg by mouth daily at 6 (six) AM.     losartan  (COZAAR ) 50 MG tablet Take 50 mg by mouth 2 (two) times daily.     lovastatin  (MEVACOR ) 20 MG tablet TAKE 1 TABLET BY MOUTH AT BEDTIME 90 tablet 1   memantine (NAMENDA) 10 MG tablet Take 10 mg by mouth daily.     metFORMIN  (GLUCOPHAGE ) 1000 MG tablet TAKE ONE TABLET (1000MG ) BY MOUTH TWICE DAILY WITH MEALS 180 tablet 1   metoprolol  succinate (TOPROL -XL) 100 MG 24 hr  tablet Take 100 mg by mouth 2 (two) times daily.     metoprolol  tartrate (LOPRESSOR ) 100 MG tablet TAKE 1 TABLET BY MOUTH TWICE A DAY 180 tablet 3   mirabegron  ER (MYRBETRIQ ) 25 MG TB24 tablet Take 1 tablet (25 mg total) by mouth daily. 30 tablet 3   Multiple Vitamin (MULTIVITAMIN) tablet Take 1 tablet by mouth daily.      Omega-3 1000 MG CAPS Take 1 g by mouth.     omeprazole (PRILOSEC) 40 MG capsule Take 40 mg by mouth daily.     Omeprazole 20 MG TBEC Take 20 mg by mouth daily at 6 (six) AM.     Probiotic Product (ALIGN) 4 MG CAPS Take 4 mg by mouth once.     RESTORA RX 60-1.25 MG CAPS Take 1 capsule by mouth daily.     tizanidine  (ZANAFLEX ) 2 MG capsule Take 1 capsule (2 mg total) by mouth at bedtime as needed for muscle spasms. 20 capsule 0   tolterodine   (DETROL  LA) 4 MG 24 hr capsule Take 4 mg by mouth daily.     vitamin B-12 (CYANOCOBALAMIN) 1000 MCG tablet Take 1,000 mcg by mouth daily.     benzonatate  (TESSALON ) 100 MG capsule Take 1-2 capsules (100-200 mg total) by mouth 3 (three) times daily as needed for cough. (Patient not taking: Reported on 10/09/2023) 40 capsule 0   Cholecalciferol 125 MCG (5000 UT) capsule Take 5,000 Units by mouth daily. (Patient not taking: Reported on 10/09/2023)     conjugated estrogens  (PREMARIN ) vaginal cream Pea sized amt PV 2 times per week for atrophic vaginitis (Patient not taking: Reported on 10/09/2023) 42.5 g 3   [facility] CRANBERRY 500 MG CAPS TAKE 1 CAPSULE BY MOUTH TWICE A DAY (Patient not taking: Reported on 10/09/2023) 60 capsule 3   docusate sodium (COLACE) 100 MG capsule Take 100 mg by mouth 2 (two) times daily. (Patient not taking: Reported on 10/09/2023)     gabapentin (NEURONTIN) 100 MG capsule Take 100 mg by mouth daily. (Patient not taking: Reported on 10/09/2023)     solifenacin (VESICARE) 5 MG tablet Take 5 mg by mouth daily. (Patient not taking: Reported on 10/09/2023)     No facility-administered medications prior to visit.    PAST MEDICAL HISTORY: Past Medical History:  Diagnosis Date   Aortic atherosclerosis    Benign neoplasm of colon    CKD (chronic kidney disease), stage III (HCC)    DDD (degenerative disc disease), lumbar    Diabetes mellitus without complication (HCC)    A1c currently a goal   Essential hypertension, benign    GERD (gastroesophageal reflux disease)    Hyperlipidemia    Followed by PCP   Hypertension    Lumbago    Migraine, unspecified, without mention of intractable migraine without mention of status migrainosus    OAB (overactive bladder)    Osteoarthrosis, unspecified whether generalized or localized, unspecified site    Palpitations    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   Solitary pulmonary nodule 2012   Urinary frequency     PAST SURGICAL HISTORY: Past Surgical  History:  Procedure Laterality Date   ABDOMINAL HYSTERECTOMY  1979   CARDIAC CATHETERIZATION  11/15/1994   Patent coronary arteries and normal LV function   CARDIAC CATHETERIZATION  11/03/2003   Normal coronary arteries   CATARACT EXTRACTION, BILATERAL  2001   COLONOSCOPY  2010   Dr.Bucinni, Normal    COLONOSCOPY  2004   Dr.Bucinni   EXERCISE STRESS TEST  11/12/1987   Positive   SHOULDER SURGERY Right 11-14-2007   TRANSTHORACIC ECHOCARDIOGRAM  05/19/2010   EF >55%, normal-mild    FAMILY HISTORY: Family History  Problem Relation Age of Onset   CVA Mother    Stroke Mother    Heart attack Father    Cancer Father        Prostate cancer   COPD Brother    Diabetes Sister    Colon cancer Sister    Diabetes Sister    Cancer Brother     SOCIAL HISTORY: Social History   Socioeconomic History   Marital status: Widowed    Spouse name: Not on file   Number of children: 3   Years of education: Not on file   Highest education level: Master's degree (e.g., MA, MS, MEng, MEd, MSW, MBA)  Occupational History   Occupation: retired Engineer, civil (consulting)  Tobacco Use   Smoking status: Former    Current packs/day: 0.00    Average packs/day: 0.5 packs/day for 20.0 years (10.0 ttl pk-yrs)    Types: Cigarettes    Start date: 01/10/1957    Quit date: 01/10/1977    Years since quitting: 46.7   Smokeless tobacco: Never   Tobacco comments:    Quit at age 89  Vaping Use   Vaping status: Never Used  Substance and Sexual Activity   Alcohol use: Not Currently   Drug use: No   Sexual activity: Yes    Partners: Male    Comment: 1st intercourse- 25, partners- 4, current partner- 1 yr   Other Topics Concern   Not on file  Social History Narrative   02/03/20 She is  Widowed -husband died 11/14/14   Retired Engineer, civil (consulting)    mother of 3, grandmother 6.    She had 10 years a history of about a half pack a day but quit 10 years ago.    Her exercising is limited by her osteoarthritis pains. But she tries to do some of the  exercises with walking on a daily basis.   Alcohol none   Social Drivers of Corporate investment banker Strain: Low Risk  (11/01/2017)   Overall Financial Resource Strain (CARDIA)    Difficulty of Paying Living Expenses: Not hard at all  Food Insecurity: No Food Insecurity (11/01/2017)   Hunger Vital Sign    Worried About Running Out of Food in the Last Year: Never true    Ran Out of Food in the Last Year: Never true  Transportation Needs: No Transportation Needs (11/01/2017)   PRAPARE - Administrator, Civil Service (Medical): No    Lack of Transportation (Non-Medical): No  Physical Activity: Inactive (11/01/2017)   Exercise Vital Sign    Days of Exercise per Week: 0 days    Minutes of Exercise per Session: 0 min  Stress: No Stress Concern Present (11/01/2017)   [employer] of Occupational Health - Occupational Stress Questionnaire    Feeling of Stress : Only a little  Social Connections: Unknown (05/23/2021)   Received from [facility]   Social Network    Social Network: Not on file  Intimate Partner Violence: Unknown (04/14/2021)   Received from [facility]   HITS    Physically Hurt: Not on file    Insult or Talk Down To: Not on file    Threaten Physical Harm: Not on file    Scream or Curse: Not on file     PHYSICAL EXAM  GENERAL EXAM/CONSTITUTIONAL: Vitals:  Vitals:  10/09/23 0906  BP: 123/71  Pulse: 65  Weight: 134 lb (60.8 kg)  Height: 5' 5 (1.651 m)   Body mass index is 22.3 kg/m. Wt Readings from Last 3 Encounters:  10/09/23 134 lb (60.8 kg)  05/08/23 138 lb (62.6 kg)  09/11/22 126 lb 5.2 oz (57.3 kg)   Patient is in no distress; well developed, nourished and groomed; neck is supple  CARDIOVASCULAR: Examination of carotid arteries is normal; no carotid bruits Regular rate and rhythm, no murmurs Examination of peripheral vascular system by observation and palpation is normal  EYES: Ophthalmoscopic exam of optic discs and  posterior segments is normal; no papilledema or hemorrhages No results found.  MUSCULOSKELETAL: Gait, strength, tone, movements noted in Neurologic exam below  NEUROLOGIC: MENTAL STATUS:     10/09/2023    9:24 AM 02/03/2020   11:35 AM 11/01/2017   11:24 AM  MMSE - Mini Mental State Exam  Orientation to time 5 5 5   Orientation to Place 5 5 5   Registration 3 3 3   Attention/ Calculation 3 0 5  Recall 2 2 3   Language- name 2 objects 2 2 2   Language- repeat 1 1 1   Language- follow 3 step command 3 3 3   Language- read & follow direction 1 1 1   Write a sentence 1 1 1   Copy design 1 1 1   Total score 27 24 30    awake, alert, oriented to person, place and time recent and remote memory intact normal attention and concentration language fluent, comprehension intact, naming intact fund of knowledge appropriate  CRANIAL NERVE:  2nd - no papilledema on fundoscopic exam 2nd, 3rd, 4th, 6th - pupils equal and reactive to light, visual fields full to confrontation, extraocular muscles intact, no nystagmus 5th - facial sensation symmetric 7th - facial strength symmetric 8th - hearing intact 9th - palate elevates symmetrically, uvula midline 11th - shoulder shrug symmetric 12th - tongue protrusion midline  MOTOR:  normal bulk and tone, full strength in the BUE, BLE  SENSORY:  normal and symmetric to light touch, temperature, vibration  COORDINATION:  finger-nose-finger, fine finger movements normal  REFLEXES:  deep tendon reflexes present and symmetric  GAIT/STATION:  narrow based gait     DIAGNOSTIC DATA (LABS, IMAGING, TESTING) - I reviewed patient records, labs, notes, testing and imaging myself where available.  Lab Results  Component Value Date   WBC 4.4 05/08/2023   HGB 13.2 05/08/2023   HCT 40.9 05/08/2023   MCV 94.5 05/08/2023   PLT 262 05/08/2023      Component Value Date/Time   NA 140 05/08/2023 1006   NA 138 03/16/2015 1041   K 3.7 05/08/2023 1006   CL  107 05/08/2023 1006   CO2 24 05/08/2023 1006   GLUCOSE 122 (H) 05/08/2023 1006   BUN 15 05/08/2023 1006   BUN 14 03/16/2015 1041   CREATININE 1.21 (H) 05/08/2023 1006   CREATININE 1.17 (H) 08/30/2018 1114   CALCIUM 9.6 05/08/2023 1006   PROT 7.2 05/08/2023 1006   PROT 7.4 03/16/2015 1041   ALBUMIN 3.9 05/08/2023 1006   ALBUMIN 4.5 03/16/2015 1041   AST 15 05/08/2023 1006   ALT 13 05/08/2023 1006   ALKPHOS 61 05/08/2023 1006   BILITOT 0.6 05/08/2023 1006   BILITOT 0.3 03/16/2015 1041   GFRNONAA 45 (L) 05/08/2023 1006   GFRNONAA 45 (L) 08/30/2018 1114   GFRAA 52 (L) 08/30/2018 1114   Lab Results  Component Value Date   CHOL 120 08/30/2018  HDL 42 (L) 08/30/2018   LDLCALC 58 08/30/2018   TRIG 115 08/30/2018   CHOLHDL 2.9 08/30/2018   Lab Results  Component Value Date   HGBA1C 6.3 (H) 08/30/2018   Lab Results  Component Value Date   VITAMINB12 1,507 (H) 02/03/2020   Lab Results  Component Value Date   TSH 1.280 02/03/2020    02/13/20 MRI brain (without) demonstrating: -Mild to moderate chronic small vessel ischemic disease. -No acute findings.   ASSESSMENT AND PLAN  83 y.o. year old female here with mild memory loss since ~2021.  Dx:  1. Mild memory disturbance      PLAN:  MILD MEMORY LOSS (mild cognitive impairment vs normal aging) - safety / supervision issues reviewed - daily physical activity / exercise (at least 15-30 minutes) - eat more plants / vegetables - increase social activities, brain stimulation, games, puzzles, hobbies, crafts, arts, music - aim for at least 7-8 hours sleep per night (or more) - avoid smoking and alcohol - caution with medications, finances, driving  Return for return to PCP.    EDUARD FABIENE HANLON, MD 10/09/2023, 10:13 AM Certified in Neurology, Neurophysiology and Neuroimaging  [facility] [address] [place], [place] [address] [phone]

## 2023-10-09 NOTE — Patient Instructions (Signed)
  MILD MEMORY LOSS (mild cognitive impairment vs normal aging) - check MRI brain and labs - safety / supervision issues reviewed - daily physical activity / exercise (at least 15-30 minutes) - eat more plants / vegetables - increase social activities, brain stimulation, games, puzzles, hobbies, crafts, arts, music - aim for at least 7-8 hours sleep per night (or more) - avoid smoking and alcohol - caution with medications, finances, driving

## 2023-12-30 IMAGING — US US ABDOMEN COMPLETE
1 series · 13 of 25 positions shown · non-contrast
Comparison: None.

CLINICAL DATA: Right upper quadrant pain. Epigastric pain for a
month.

EXAM:
ABDOMEN ULTRASOUND COMPLETE

[Series 1: us abdomen complete · 0.17mm/px · 13 of 96 slices shown]
[im 1/96]
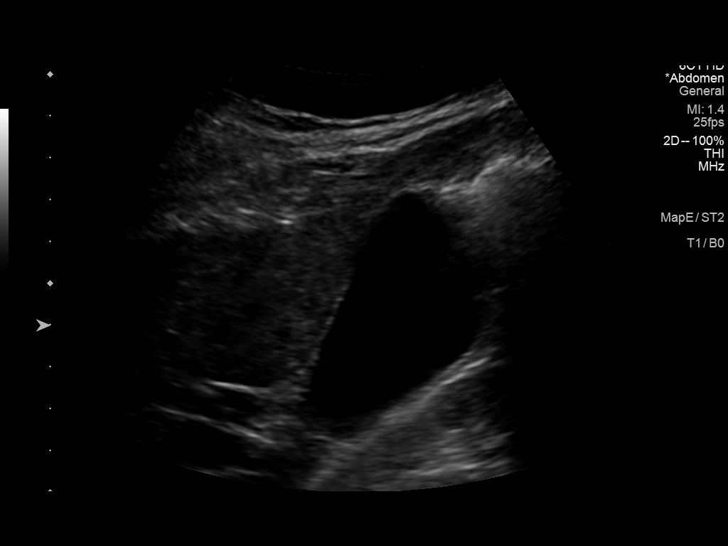
[im 8/96]
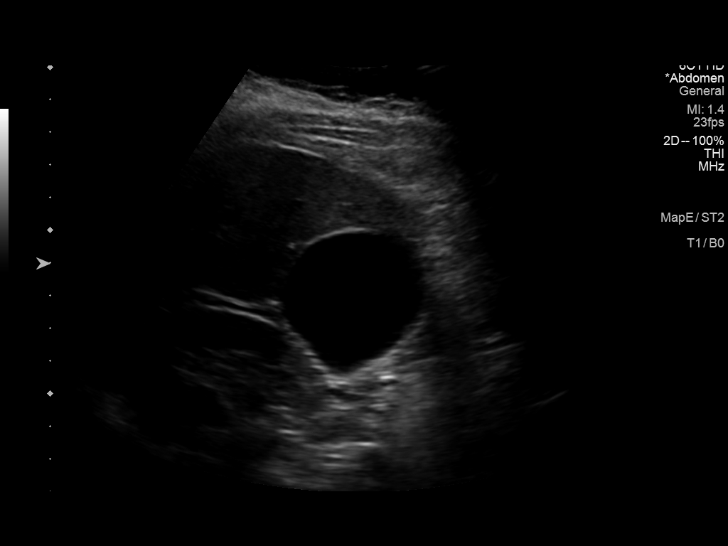
[im 16/96]
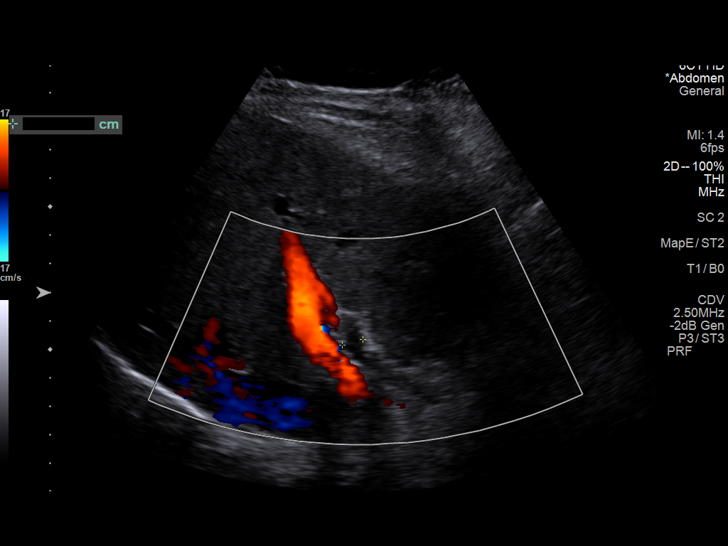
[im 24/96]
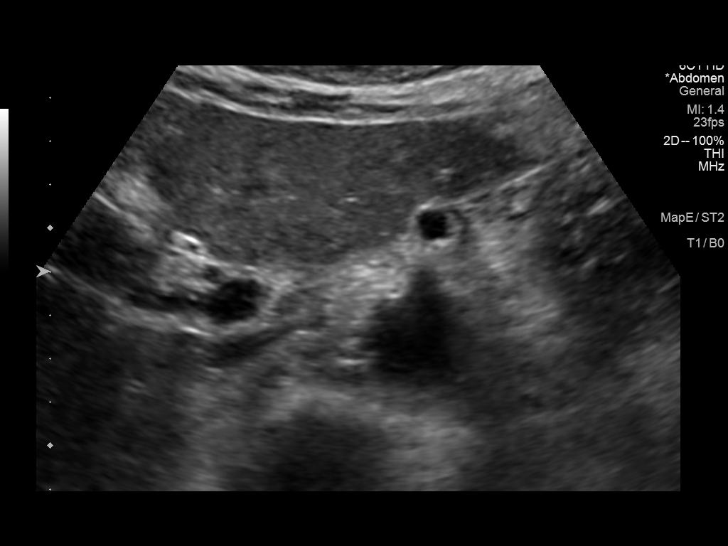
[im 32/96]
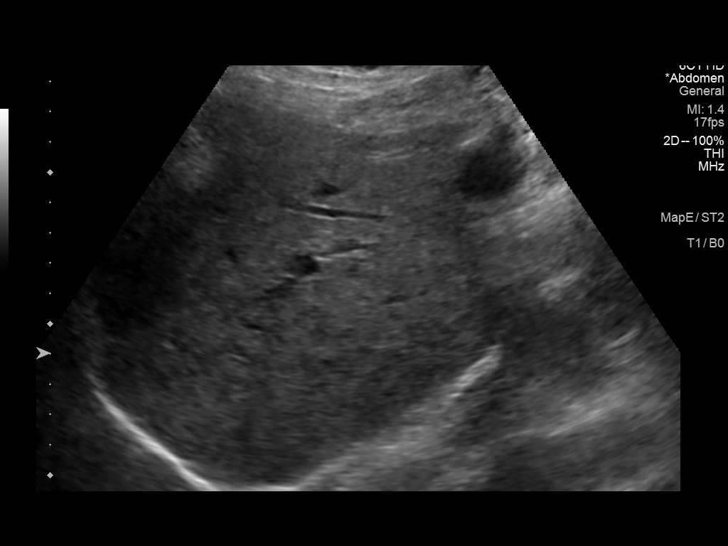
[im 40/96]
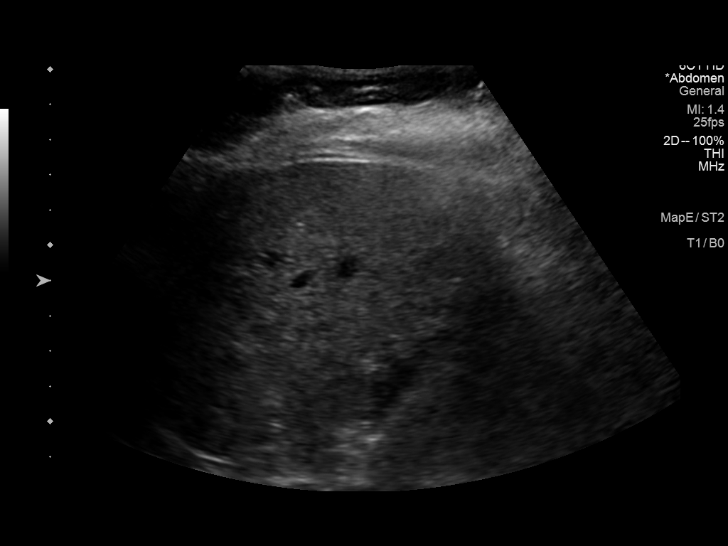
[im 48/96]
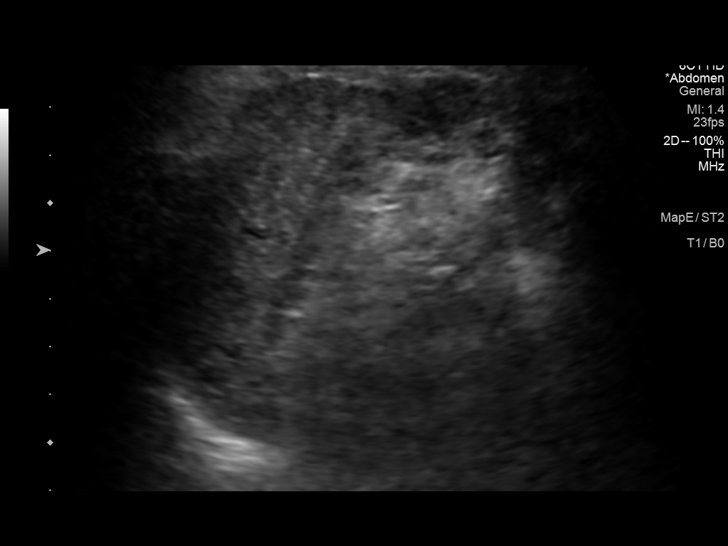
[im 56/96]
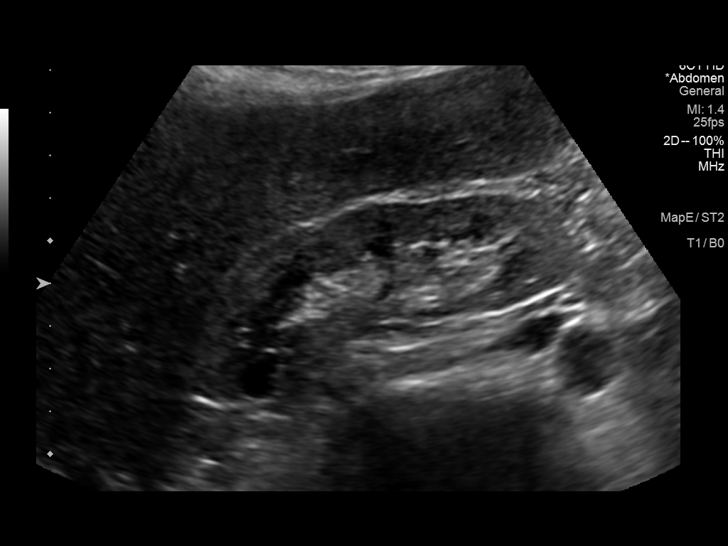
[im 64/96]
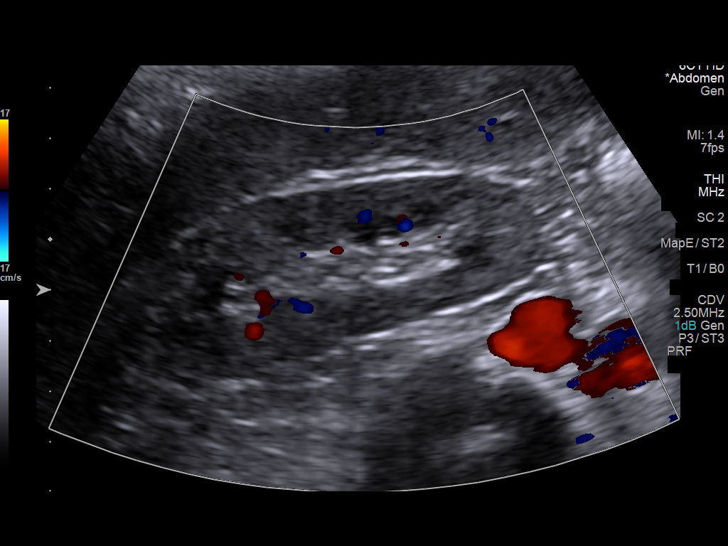
[im 72/96]
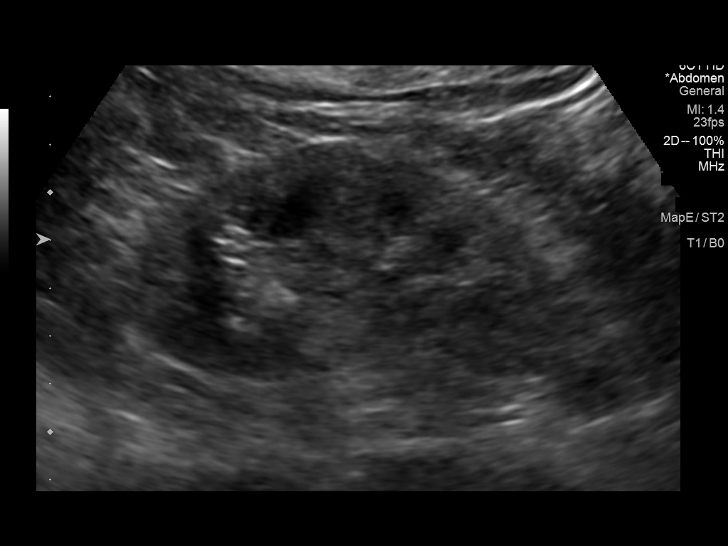
[im 80/96]
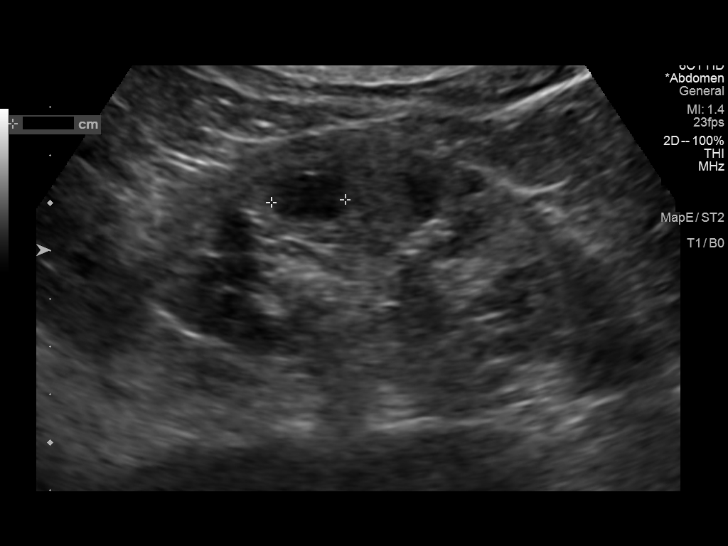
[im 88/96]
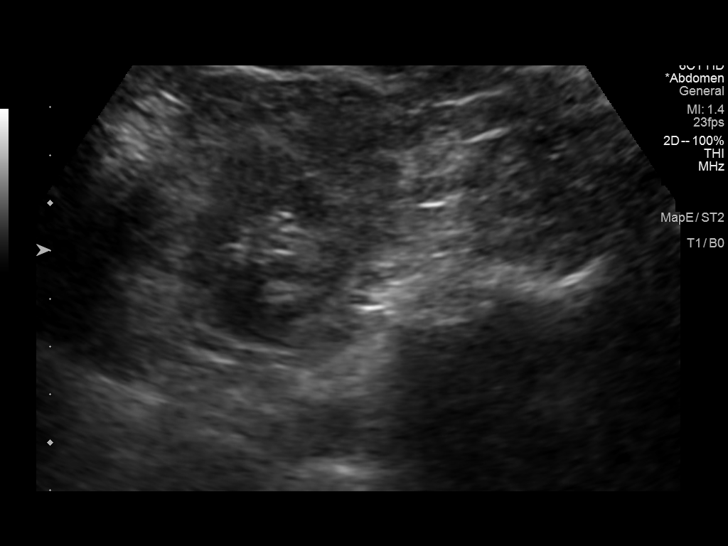
[im 96/96]
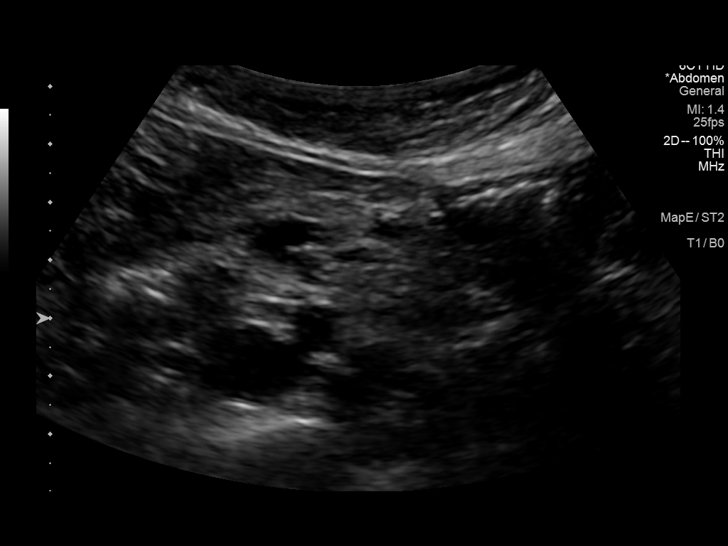

[13 of 25 positions shown; findings below may reference images not displayed]

FINDINGS: Gallbladder: No gallstones or wall thickening visualized. No
sonographic Murphy sign noted by sonographer.

Common bile duct: Diameter: 7.5 mm

Liver: No focal lesion identified. Within normal limits in
parenchymal echogenicity. Portal vein is patent on color Doppler
imaging with normal direction of blood flow towards the liver.

IVC: No abnormality visualized.

Pancreas: Visualized portion unremarkable.

Spleen: Size and appearance within normal limits.

Right Kidney: Length: 9.5 cm. Increased cortical echogenicity. A 12
mm cyst is identified in the right kidney. No follow-up imaging is
recommended.

Left Kidney: Length: 9.1 cm. Increased cortical echogenicity. A
cm cyst is identified in the liver. No follow-up imaging is
necessary.

Abdominal aorta: No significant abnormalities.  No aneurysm.

Other findings: None.
IMPRESSION: 1. The common bile duct is dilated to 7.5 mm. Recommend correlation
with LFTs. If there is concern for biliary obstruction, recommend
MRCP or ERCP.
2. Increased cortical echogenicity identified in both kidneys,
likely due to medical renal disease.
3. No other significant abnormalities.
